# Patient Record
Sex: Female | Born: 1950 | Race: White | Hispanic: No | Smoking: Former smoker
Health system: Southern US, Community
[De-identification: ages and names within clinical notes are randomized; demographics above are authoritative.]

## PROBLEM LIST (undated history)

## (undated) DIAGNOSIS — E119 Type 2 diabetes mellitus without complications: Secondary | ICD-10-CM

## (undated) DIAGNOSIS — T7840XA Allergy, unspecified, initial encounter: Secondary | ICD-10-CM

## (undated) DIAGNOSIS — M797 Fibromyalgia: Secondary | ICD-10-CM

## (undated) DIAGNOSIS — C50919 Malignant neoplasm of unspecified site of unspecified female breast: Secondary | ICD-10-CM

## (undated) DIAGNOSIS — F419 Anxiety disorder, unspecified: Secondary | ICD-10-CM

## (undated) DIAGNOSIS — K7581 Nonalcoholic steatohepatitis (NASH): Secondary | ICD-10-CM

## (undated) DIAGNOSIS — I10 Essential (primary) hypertension: Secondary | ICD-10-CM

## (undated) DIAGNOSIS — Z8619 Personal history of other infectious and parasitic diseases: Secondary | ICD-10-CM

## (undated) DIAGNOSIS — E785 Hyperlipidemia, unspecified: Secondary | ICD-10-CM

## (undated) DIAGNOSIS — M1990 Unspecified osteoarthritis, unspecified site: Secondary | ICD-10-CM

## (undated) DIAGNOSIS — K219 Gastro-esophageal reflux disease without esophagitis: Secondary | ICD-10-CM

## (undated) HISTORY — PX: ESOPHAGOGASTRODUODENOSCOPY: SHX1529

## (undated) HISTORY — DX: Hyperlipidemia, unspecified: E78.5

## (undated) HISTORY — DX: Allergy, unspecified, initial encounter: T78.40XA

## (undated) HISTORY — DX: Anxiety disorder, unspecified: F41.9

## (undated) HISTORY — DX: Gastro-esophageal reflux disease without esophagitis: K21.9

## (undated) HISTORY — DX: Essential (primary) hypertension: I10

## (undated) HISTORY — PX: TONSILLECTOMY: SUR1361

## (undated) HISTORY — DX: Type 2 diabetes mellitus without complications: E11.9

## (undated) HISTORY — PX: CATARACT EXTRACTION, BILATERAL: SHX1313

## (undated) HISTORY — PX: COLONOSCOPY: SHX174

---

## 1971-05-26 HISTORY — PX: SPLENECTOMY: SUR1306

## 2006-05-25 DIAGNOSIS — Z923 Personal history of irradiation: Secondary | ICD-10-CM

## 2006-05-25 DIAGNOSIS — C50912 Malignant neoplasm of unspecified site of left female breast: Secondary | ICD-10-CM

## 2006-05-25 HISTORY — PX: BREAST LUMPECTOMY: SHX2

## 2006-05-25 HISTORY — DX: Personal history of irradiation: Z92.3

## 2006-05-25 HISTORY — DX: Malignant neoplasm of unspecified site of left female breast: C50.912

## 2007-05-26 HISTORY — PX: ANKLE FUSION: SHX881

## 2007-05-26 HISTORY — PX: APPENDECTOMY: SHX54

## 2009-05-14 ENCOUNTER — Emergency Department: Payer: Self-pay | Admitting: Emergency Medicine

## 2009-05-25 HISTORY — PX: ROTATOR CUFF REPAIR: SHX139

## 2012-05-25 HISTORY — PX: REDUCTION MAMMAPLASTY: SUR839

## 2012-07-24 DIAGNOSIS — S86899A Other injury of other muscle(s) and tendon(s) at lower leg level, unspecified leg, initial encounter: Secondary | ICD-10-CM | POA: Insufficient documentation

## 2013-01-03 DIAGNOSIS — M810 Age-related osteoporosis without current pathological fracture: Secondary | ICD-10-CM | POA: Insufficient documentation

## 2017-05-25 HISTORY — PX: TOTAL SHOULDER REPLACEMENT: SUR1217

## 2019-06-06 DIAGNOSIS — F419 Anxiety disorder, unspecified: Secondary | ICD-10-CM | POA: Insufficient documentation

## 2019-06-06 DIAGNOSIS — F5101 Primary insomnia: Secondary | ICD-10-CM | POA: Insufficient documentation

## 2019-06-06 DIAGNOSIS — G4709 Other insomnia: Secondary | ICD-10-CM | POA: Insufficient documentation

## 2019-06-06 DIAGNOSIS — Z9081 Acquired absence of spleen: Secondary | ICD-10-CM | POA: Insufficient documentation

## 2019-06-06 DIAGNOSIS — E119 Type 2 diabetes mellitus without complications: Secondary | ICD-10-CM | POA: Insufficient documentation

## 2019-06-06 DIAGNOSIS — R768 Other specified abnormal immunological findings in serum: Secondary | ICD-10-CM | POA: Insufficient documentation

## 2019-06-06 DIAGNOSIS — I10 Essential (primary) hypertension: Secondary | ICD-10-CM | POA: Insufficient documentation

## 2019-06-06 DIAGNOSIS — Z86000 Personal history of in-situ neoplasm of breast: Secondary | ICD-10-CM | POA: Insufficient documentation

## 2019-07-11 DIAGNOSIS — M797 Fibromyalgia: Secondary | ICD-10-CM | POA: Insufficient documentation

## 2019-07-11 DIAGNOSIS — Z8619 Personal history of other infectious and parasitic diseases: Secondary | ICD-10-CM | POA: Insufficient documentation

## 2019-07-24 DIAGNOSIS — H2511 Age-related nuclear cataract, right eye: Secondary | ICD-10-CM | POA: Insufficient documentation

## 2019-08-02 DIAGNOSIS — N941 Unspecified dyspareunia: Secondary | ICD-10-CM | POA: Insufficient documentation

## 2019-10-02 DIAGNOSIS — Z9289 Personal history of other medical treatment: Secondary | ICD-10-CM | POA: Insufficient documentation

## 2019-10-02 DIAGNOSIS — I517 Cardiomegaly: Secondary | ICD-10-CM | POA: Insufficient documentation

## 2019-10-02 DIAGNOSIS — I5189 Other ill-defined heart diseases: Secondary | ICD-10-CM | POA: Insufficient documentation

## 2019-12-06 DIAGNOSIS — E663 Overweight: Secondary | ICD-10-CM | POA: Insufficient documentation

## 2020-03-22 DIAGNOSIS — J301 Allergic rhinitis due to pollen: Secondary | ICD-10-CM | POA: Insufficient documentation

## 2020-03-22 DIAGNOSIS — J342 Deviated nasal septum: Secondary | ICD-10-CM | POA: Insufficient documentation

## 2020-07-03 DIAGNOSIS — J479 Bronchiectasis, uncomplicated: Secondary | ICD-10-CM | POA: Insufficient documentation

## 2020-07-25 DIAGNOSIS — K7460 Unspecified cirrhosis of liver: Secondary | ICD-10-CM | POA: Insufficient documentation

## 2020-12-27 LAB — HM COLONOSCOPY

## 2021-03-10 DIAGNOSIS — M24672 Ankylosis, left ankle: Secondary | ICD-10-CM | POA: Insufficient documentation

## 2021-03-10 DIAGNOSIS — M65331 Trigger finger, right middle finger: Secondary | ICD-10-CM | POA: Insufficient documentation

## 2021-03-21 DIAGNOSIS — Z789 Other specified health status: Secondary | ICD-10-CM | POA: Insufficient documentation

## 2021-03-25 DIAGNOSIS — M12579 Traumatic arthropathy, unspecified ankle and foot: Secondary | ICD-10-CM | POA: Insufficient documentation

## 2021-06-04 DIAGNOSIS — M542 Cervicalgia: Secondary | ICD-10-CM | POA: Diagnosis not present

## 2021-06-04 DIAGNOSIS — M9902 Segmental and somatic dysfunction of thoracic region: Secondary | ICD-10-CM | POA: Diagnosis not present

## 2021-06-04 DIAGNOSIS — M9901 Segmental and somatic dysfunction of cervical region: Secondary | ICD-10-CM | POA: Diagnosis not present

## 2021-06-04 DIAGNOSIS — R519 Headache, unspecified: Secondary | ICD-10-CM | POA: Diagnosis not present

## 2021-07-04 ENCOUNTER — Telehealth: Payer: Self-pay

## 2021-07-04 NOTE — Telephone Encounter (Signed)
Copied from [place] [phone]. Topic: General - Other >> Jul 04, 2021  1:50 PM Pawlus, Brayton Layman A wrote: Reason for CRM: Pt called in to make sure her records from [facility] have been connected to her [place] MyChart account, please advise if pt would still need to get her medical records sent over from [facility] as well.

## 2021-07-04 NOTE — Telephone Encounter (Signed)
Pt was called to let them know that we can see her MR from [facility]

## 2021-07-21 ENCOUNTER — Ambulatory Visit: Payer: Self-pay | Admitting: Internal Medicine

## 2021-07-24 DIAGNOSIS — Z7984 Long term (current) use of oral hypoglycemic drugs: Secondary | ICD-10-CM | POA: Diagnosis not present

## 2021-07-24 DIAGNOSIS — Z961 Presence of intraocular lens: Secondary | ICD-10-CM | POA: Diagnosis not present

## 2021-07-24 DIAGNOSIS — H04123 Dry eye syndrome of bilateral lacrimal glands: Secondary | ICD-10-CM | POA: Diagnosis not present

## 2021-07-24 DIAGNOSIS — H52221 Regular astigmatism, right eye: Secondary | ICD-10-CM | POA: Diagnosis not present

## 2021-07-24 DIAGNOSIS — H01119 Allergic dermatitis of unspecified eye, unspecified eyelid: Secondary | ICD-10-CM | POA: Diagnosis not present

## 2021-07-24 DIAGNOSIS — H5201 Hypermetropia, right eye: Secondary | ICD-10-CM | POA: Diagnosis not present

## 2021-07-24 DIAGNOSIS — E119 Type 2 diabetes mellitus without complications: Secondary | ICD-10-CM | POA: Diagnosis not present

## 2021-07-24 DIAGNOSIS — H524 Presbyopia: Secondary | ICD-10-CM | POA: Diagnosis not present

## 2021-07-24 DIAGNOSIS — H5212 Myopia, left eye: Secondary | ICD-10-CM | POA: Diagnosis not present

## 2021-07-24 LAB — HM DIABETES EYE EXAM

## 2021-07-29 ENCOUNTER — Ambulatory Visit
Admission: RE | Admit: 2021-07-29 | Discharge: 2021-07-29 | Disposition: A | Discharge status: Home / Self Care | Payer: Medicare HMO | Attending: Family Medicine | Admitting: Family Medicine

## 2021-07-29 ENCOUNTER — Ambulatory Visit
Admission: RE | Admit: 2021-07-29 | Discharge: 2021-07-29 | Disposition: A | Discharge status: Home / Self Care | Payer: Medicare HMO | Source: Ambulatory Visit | Attending: Family Medicine | Admitting: Family Medicine

## 2021-07-29 ENCOUNTER — Encounter: Payer: Self-pay | Admitting: Family Medicine

## 2021-07-29 ENCOUNTER — Other Ambulatory Visit: Payer: Self-pay

## 2021-07-29 ENCOUNTER — Ambulatory Visit (INDEPENDENT_AMBULATORY_CARE_PROVIDER_SITE_OTHER): Payer: Medicare HMO | Admitting: Family Medicine

## 2021-07-29 VITALS — BP 124/82 | HR 74 | Ht 63.5 in | Wt 172.0 lb

## 2021-07-29 DIAGNOSIS — M25561 Pain in right knee: Secondary | ICD-10-CM

## 2021-07-29 DIAGNOSIS — M25572 Pain in left ankle and joints of left foot: Secondary | ICD-10-CM | POA: Diagnosis not present

## 2021-07-29 DIAGNOSIS — M1990 Unspecified osteoarthritis, unspecified site: Secondary | ICD-10-CM | POA: Insufficient documentation

## 2021-07-29 DIAGNOSIS — M722 Plantar fascial fibromatosis: Secondary | ICD-10-CM

## 2021-07-29 DIAGNOSIS — M25512 Pain in left shoulder: Secondary | ICD-10-CM

## 2021-07-29 DIAGNOSIS — M79671 Pain in right foot: Secondary | ICD-10-CM | POA: Diagnosis not present

## 2021-07-29 DIAGNOSIS — G8929 Other chronic pain: Secondary | ICD-10-CM | POA: Diagnosis not present

## 2021-07-29 DIAGNOSIS — M1711 Unilateral primary osteoarthritis, right knee: Secondary | ICD-10-CM | POA: Diagnosis not present

## 2021-07-29 DIAGNOSIS — Z853 Personal history of malignant neoplasm of breast: Secondary | ICD-10-CM | POA: Diagnosis not present

## 2021-07-29 DIAGNOSIS — M19012 Primary osteoarthritis, left shoulder: Secondary | ICD-10-CM | POA: Diagnosis not present

## 2021-07-29 DIAGNOSIS — Z981 Arthrodesis status: Secondary | ICD-10-CM | POA: Diagnosis not present

## 2021-07-29 DIAGNOSIS — M2011 Hallux valgus (acquired), right foot: Secondary | ICD-10-CM | POA: Diagnosis not present

## 2021-07-29 DIAGNOSIS — M958 Other specified acquired deformities of musculoskeletal system: Secondary | ICD-10-CM | POA: Diagnosis not present

## 2021-07-29 MED ORDER — DICLOFENAC SODIUM 75 MG PO TBEC: 75.0000 mg | DELAYED_RELEASE_TABLET | Freq: Two times a day (BID) | ORAL | 0 refills | Status: DC

## 2021-07-29 NOTE — Patient Instructions (Signed)
-   Stop meloxicam ?- Start diclofenac 75 mg, dose a.m. and p.m. scheduled (take with food) ?- Use silicone heel cup inserts until return ?- Obtain x-rays ?- Return for follow-up next week, contact us for questions between now and then ?

## 2021-07-29 NOTE — Progress Notes (Signed)
?  ? ?  Primary Care / Sports Medicine Office Visit ? ?Patient Information:  ?Patient ID: Sharon Kaufman, female DOB: 04/07/1951 Age: 71 y.o. MRN: [id number]  ? ?Sharon Kaufman is a pleasant 71 y.o. female presenting with the following: ? ?Chief Complaint  ?Patient presents with  ? New Patient (Initial Visit)  ? Knee Pain  ?  Right, chronic; x3 years; 6/10 pain  ? Foot Pain  ?  Right; x10 months; injection at [facility] at the onset, but pain has returned; 6/10 pain  ? ? ?Vitals:  ? 07/29/21 1445  ?BP: 124/82  ?Pulse: 74  ?SpO2: 97%  ? ?Vitals:  ? 07/29/21 1445  ?Weight: 172 lb (78 kg)  ?Height: 5' 3.5" (1.613 m)  ? ?Body mass index is 29.99 kg/m?. ? ?No results found.  ? ?Independent interpretation of notes and tests performed by another provider:  ? ?None ? ?Procedures performed:  ? ?None ? ?Pertinent History, Exam, Impression, and Recommendations:  ? ?Chronic pain of right knee ?Chronic condition, ongoing for years, has noted recent flare over the past several months, atraumatic onset.  Pain localized to the anterior knee.  Examination findings do reveal tenderness about the patella facets, medial greater than lateral joint line, provocative testing otherwise benign.  Findings most consistent with tricompartmental osteoarthritis related arthralgia.  Plan for dedicated x-rays of the right knee, transition of meloxicam to diclofenac given her symptomatology on the meloxicam.  Low threshold for advanced a cortisone pending symptoms at her return. ? ?Chronic condition, symptomatic, Rx management ? ?Plantar fasciitis, right ?This is a chronic condition, has noted recurrence over the past few months as any change in activity preceding recurrence.  Treated in the distant past with cortisone injection, has been dosing meloxicam currently with limited benefit. ? ?Examination findings shows focal tenderness at the and the area calcaneus medially, tightness throughout the Achilles.  Plan for silicone heel cup inserts,  transition to diclofenac, close follow-up after x-rays.  Low threshold for injection given duration of symptoms and treatments to date. ? ?Chronic condition, symptomatic, Rx management  ? ?Orders & Medications ?Meds ordered this encounter  ?Medications  ? diclofenac (VOLTAREN) 75 MG EC tablet  ?  Sig: Take 1 tablet (75 mg total) by mouth 2 (two) times daily.  ?  Dispense:  60 tablet  ?  Refill:  0  ? ?Orders Placed This Encounter  ?Procedures  ? DG Knee Complete 4 Views Right  ? DG Foot Complete Right  ? DG Shoulder Left  ? DG Ankle Complete Left  ?  ? ?Return in about 1 week (around 08/05/2021).  ?  ? ?Montel Culver, MD ? ? Primary Care Sports Medicine ?[facility] ?[place]  ? ?

## 2021-07-29 NOTE — Assessment & Plan Note (Deleted)
Chronic condition, ongoing for years, has noted recent flare over the past several months, atraumatic onset.  Pain localized to the anterior knee.  Examination findings do reveal tenderness about the patella facets, medial greater than lateral joint line, provocative testing otherwise benign.  Findings most consistent with tricompartmental osteoarthritis related arthralgia.  Plan for dedicated x-rays of the right knee, transition of meloxicam to diclofenac given her symptomatology on the meloxicam.  Low threshold for advanced a cortisone pending symptoms at her return. ? ?Chronic condition, symptomatic, Rx management ?

## 2021-07-29 NOTE — Assessment & Plan Note (Signed)
This is a chronic condition, has noted recurrence over the past few months as any change in activity preceding recurrence.  Treated in the distant past with cortisone injection, has been dosing meloxicam currently with limited benefit. ? ?Examination findings shows focal tenderness at the and the area calcaneus medially, tightness throughout the Achilles.  Plan for silicone heel cup inserts, transition to diclofenac, close follow-up after x-rays.  Low threshold for injection given duration of symptoms and treatments to date. ? ?Chronic condition, symptomatic, Rx management ?

## 2021-07-29 NOTE — Assessment & Plan Note (Signed)
Chronic condition, ongoing for years, has noted recent flare over the past several months, atraumatic onset.  Pain localized to the anterior knee.  Examination findings do reveal tenderness about the patella facets, medial greater than lateral joint line, provocative testing otherwise benign.  Findings most consistent with tricompartmental osteoarthritis related arthralgia.  Plan for dedicated x-rays of the right knee, transition of meloxicam to diclofenac given her symptomatology on the meloxicam.  Low threshold for advanced a cortisone pending symptoms at her return. ? ?Chronic condition, symptomatic, Rx management ?

## 2021-07-30 ENCOUNTER — Telehealth: Payer: Self-pay

## 2021-07-30 NOTE — Telephone Encounter (Signed)
-----   Message from Montel Culver, MD sent at 07/29/2021  5:50 PM EST ----- ?Regarding: HA authorization ?Please reach out for auth for right knee HA injections (gel injections). Thanks ? ?

## 2021-07-30 NOTE — Telephone Encounter (Signed)
New case for viscosupplementation for right knee submitted through the [employer] portal.  Pending response.  For your information.   ?

## 2021-08-01 NOTE — Telephone Encounter (Signed)
Patient covered at 100% for Monovisc through both medical and pharmacy benefit once OOP has been met. OOP is $3,400.00 and $20.00 has been met, so patient will have to pay the difference ($3,380.00).  There is also a $20 procedure copay charged at the time of visit. PA is not required.  For your information.  ?

## 2021-08-05 ENCOUNTER — Other Ambulatory Visit: Payer: Self-pay

## 2021-08-05 ENCOUNTER — Encounter: Payer: Self-pay | Admitting: Family Medicine

## 2021-08-05 ENCOUNTER — Ambulatory Visit (INDEPENDENT_AMBULATORY_CARE_PROVIDER_SITE_OTHER): Payer: Medicare HMO | Admitting: Family Medicine

## 2021-08-05 VITALS — BP 136/82 | HR 73 | Ht 63.5 in | Wt 173.0 lb

## 2021-08-05 DIAGNOSIS — M7918 Myalgia, other site: Secondary | ICD-10-CM

## 2021-08-05 DIAGNOSIS — G8929 Other chronic pain: Secondary | ICD-10-CM | POA: Diagnosis not present

## 2021-08-05 DIAGNOSIS — M1711 Unilateral primary osteoarthritis, right knee: Secondary | ICD-10-CM | POA: Diagnosis not present

## 2021-08-05 DIAGNOSIS — M722 Plantar fascial fibromatosis: Secondary | ICD-10-CM

## 2021-08-05 MED ORDER — DULOXETINE HCL 30 MG PO CPEP: 30.0000 mg | ORAL_CAPSULE | Freq: Every day | ORAL | 0 refills | Status: DC

## 2021-08-05 NOTE — Assessment & Plan Note (Signed)
Patient presents for follow-up to right chronic heel pain, at last visit on 07/29/2021 her findings are most consistent with plantar fasciitis.  She was advised to transition from meloxicam to diclofenac, initiation of silicone heel cup inserts, x-rays, and close follow-up. ? ?Unfortunate, patient continues to be symptomatic despite these interventions, her x-rays do reveal changes consistent with plantar fasciitis as well as Achilles tendinopathy.  I reviewed the nature of the posterior chain in relation to recurrent/symptomatic plantar fasciitis. ? ?At this stage have advised close follow-up, initiation of duloxetine, and low threshold to proceed with ultrasound-guided corticosteroid injection if symptomatic at return. ? ?Chronic condition, symptomatic, Rx management, independent interpretation x-rays ?

## 2021-08-05 NOTE — Patient Instructions (Addendum)
-   Continue diclofenac, can transition to an as needed up to twice a day dosing (take with food) ?- For additional pain control, can use Tylenol (acetaminophen) on an as-needed basis ?- Once available, start duloxetine (Cymbalta) 30 mg nightly ?- After 2 weeks, increase duloxetine to 60 mg nightly (2 capsules) ?- Return once scheduled for next available visit to follow-up on right knee and right foot ?- Obtain labs with orders placed today ?- Our office will contact you with results and next steps next-contact us for any questions between now and then ?

## 2021-08-05 NOTE — Progress Notes (Signed)
?  ? ?Primary Care / Sports Medicine Office Visit ? ?Patient Information:  ?Patient ID: Sharon Kaufman, female DOB: 1950-10-25 Age: 71 y.o. MRN: [id number]  ? ?Sharon Kaufman is a pleasant 71 y.o. female presenting with the following: ? ?Chief Complaint  ?Patient presents with  ? Knee Pain  ?  Right knee  ? Right heel   ? Shoulder Pain  ?  right  ? ? ?Vitals:  ? 08/05/21 1120  ?BP: 136/82  ?Pulse: 73  ?SpO2: 97%  ? ?Vitals:  ? 08/05/21 1120  ?Weight: 173 lb (78.5 kg)  ?Height: 5' 3.5" (1.613 m)  ? ?Body mass index is 30.16 kg/m?. ? ?  ? ?Independent interpretation of notes and tests performed by another provider:  ? 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 ?Procedures performed:  ? ?None ? ?Pertinent History, Exam, Impression, and Recommendations:  ? ?Osteoarthritis of right knee ?Patient presents for right knee pain, ongoing for years.  At her last visit on 07/29/2021, she was advised dedicated right knee x-rays, transition from meloxicam to diclofenac and close follow-up. ? ?Unfortunately, despite medication changes, she has noted no change in her symptomatology.  X-rays do reveal degenerative changes primarily localized about the patellofemoral and medial tibiofemoral articulations which are consistent with her examination findings.   That being said, given the persistent symptomatology, multiple sites of pain, stated history of arthralgia, a component thereof may be at play. ? ?Plan for transition from scheduled to as needed diclofenac, initiation of duloxetine at 30 mg with titration to 60 mg after 2 weeks.  Close follow-up for reevaluation and low threshold to proceed with intra-articular corticosteroid injection under ultrasound guidance if indicated. ? ?Chronic condition, symptomatic, independent interpretation x-ray, Rx management ? ?Chronic musculoskeletal pain ?Chronic condition affecting multiple sites that patient has endorsed inclusive of left shoulder, bilateral wrist/thumbs, right knee, right foot, left ankle.  She gives a stated history of both fibromyalgia as well as rheumatic concern unspecified.  Her records are from outside groups.  She has been advised to attend a rheumatologist in the past and has never seen one. ? ?Given her broad array of stated symptomatology plan for rheumatologic serum panel, referral to rheumatologist pending panel results, and initiation of duloxetine at 30 mg with plan for further titration to 60 mg after 2 weeks. ? ?Chronic condition, symptomatic, Rx management ? ?Plantar fasciitis, right ?Patient presents for follow-up to right chronic heel pain, at last visit on 07/29/2021 her findings are most consistent with plantar fasciitis.  She was advised to transition from meloxicam to diclofenac, initiation of silicone heel cup inserts, x-rays, and close follow-up. ? ?Unfortunate, patient continues to be symptomatic despite these interventions, her x-rays do reveal changes consistent with plantar fasciitis as well as Achilles tendinopathy.  I reviewed the nature of the posterior chain in relation to recurrent/symptomatic plantar fasciitis. ? ?At this stage have advised close follow-up, initiation of duloxetine, and low threshold to proceed with ultrasound-guided corticosteroid injection if symptomatic at  return. ? ?  Chronic condition, symptomatic, Rx management, independent interpretation x-rays  ? ?Orders & Medications ?Meds ordered this encounter  ?Medications  ? DULoxetine (CYMBALTA) 30 MG capsule  ?  Sig: Take 1 capsule (30 mg total) by mouth daily.  ?  Dispense:  90 capsule  ?  Refill:  0  ? ?Orders Placed This Encounter  ?Procedures  ? ANA 12 Plus Profile (RDL)  ?  ? ?Return for follow-up right knee and right foot/heel.  ?  ? ?Montel Culver, MD ? ? Primary Care Sports Medicine ?[facility] ?[place]  ? ?

## 2021-08-05 NOTE — Assessment & Plan Note (Signed)
Patient presents for right knee pain, ongoing for years.  At her last visit on 07/29/2021, she was advised dedicated right knee x-rays, transition from meloxicam to diclofenac and close follow-up. ? ?Unfortunately, despite medication changes, she has noted no change in her symptomatology.  X-rays do reveal degenerative changes primarily localized about the patellofemoral and medial tibiofemoral articulations which are consistent with her examination findings.  That being said, given the persistent symptomatology, multiple sites of pain, stated history of arthralgia, a component thereof may be at play. ? ?Plan for transition from scheduled to as needed diclofenac, initiation of duloxetine at 30 mg with titration to 60 mg after 2 weeks.  Close follow-up for reevaluation and low threshold to proceed with intra-articular corticosteroid injection under ultrasound guidance if indicated. ? ?Chronic condition, symptomatic, independent interpretation x-ray, Rx management ?

## 2021-08-05 NOTE — Assessment & Plan Note (Signed)
Chronic condition affecting multiple sites that patient has endorsed inclusive of left shoulder, bilateral wrist/thumbs, right knee, right foot, left ankle.  She gives a stated history of both fibromyalgia as well as rheumatic concern unspecified.  Her records are from outside groups.  She has been advised to attend a rheumatologist in the past and has never seen one. ? ?Given her broad array of stated symptomatology plan for rheumatologic serum panel, referral to rheumatologist pending panel results, and initiation of duloxetine at 30 mg with plan for further titration to 60 mg after 2 weeks. ? ?Chronic condition, symptomatic, Rx management ?

## 2021-08-12 ENCOUNTER — Inpatient Hospital Stay (INDEPENDENT_AMBULATORY_CARE_PROVIDER_SITE_OTHER): Payer: Medicare HMO | Admitting: Radiology

## 2021-08-12 ENCOUNTER — Encounter: Payer: Self-pay | Admitting: Family Medicine

## 2021-08-12 ENCOUNTER — Other Ambulatory Visit: Payer: Self-pay

## 2021-08-12 ENCOUNTER — Ambulatory Visit (INDEPENDENT_AMBULATORY_CARE_PROVIDER_SITE_OTHER): Payer: Medicare HMO | Admitting: Family Medicine

## 2021-08-12 ENCOUNTER — Telehealth: Payer: Self-pay

## 2021-08-12 VITALS — BP 124/82 | HR 84 | Ht 63.5 in | Wt 173.6 lb

## 2021-08-12 DIAGNOSIS — M722 Plantar fascial fibromatosis: Secondary | ICD-10-CM | POA: Diagnosis not present

## 2021-08-12 DIAGNOSIS — M1711 Unilateral primary osteoarthritis, right knee: Secondary | ICD-10-CM

## 2021-08-12 MED ORDER — TRIAMCINOLONE ACETONIDE 40 MG/ML IJ SUSP
80.0000 mg | Freq: Once | INTRAMUSCULAR | Status: AC
  Administered 2021-08-12: 80 mg via INTRAMUSCULAR

## 2021-08-12 NOTE — Progress Notes (Signed)
?  ? ?Primary Care / Sports Medicine Office Visit ? ?Patient Information:  ?Patient ID: Sharon Kaufman, female DOB: [birth year] Age: 71 y.o. MRN: [id number]  ? ?Sharon Kaufman is a pleasant 72 y.o. female presenting with the following: ? ?Chief Complaint  ?Patient presents with  ? Knee Pain  ?  Right, still same. Wants injection  ? Plantar Fasciitis  ?  Right still same, wants injection  ? ? ?Vitals:  ? 08/12/21 1127  ?BP: 124/82  ?Pulse: 84  ?SpO2: 98%  ? ?Vitals:  ? 08/12/21 1127  ?Weight: 173 lb 9.6 oz (78.7 kg)  ?Height: 5' 3.5" (1.613 m)  ? ?Body mass index is 30.27 kg/m?. ? ?  ? ?Independent interpretation of notes and tests performed by another provider:  ? ?None ? ?Procedures performed:  ? ?Procedure:  Injection of right plantar fascia at the calcaneus under ultrasound guidance. ?Ultrasound guidance utilized for in-plane approach from the medial hindfoot to the plantar fascia at the calcaneus, peri-fascial subtle effusion noted ?Samsung HS60 device utilized with permanent recording / reporting. ?Verbal informed consent obtained and verified. ?Skin prepped in a sterile fashion. ?Ethyl chloride for topical local analgesia.  ?Completed without difficulty and tolerated well. ?Medication: triamcinolone acetonide 40 mg/mL suspension for injection 1 mL total and 1 mL lidocaine 1% without epinephrine utilized for needle placement anesthetic ?Advised to contact for fevers/chills, erythema, induration, drainage, or persistent bleeding. ? ?Procedure:  Injection of right knee under ultrasound guidance. ?Ultrasound guidance utilized for anteromedial out of plane approach to the right knee joint, no effusion noted ?Samsung HS60 device utilized with permanent recording / reporting. ?Verbal informed consent obtained and verified. ?Skin prepped in a sterile fashion. ?Ethyl chloride for topical local analgesia.  ?Completed without difficulty and tolerated well. ?Medication: triamcinolone acetonide 40 mg/mL suspension for  injection 1 mL total and 2 mL lidocaine 1% without epinephrine utilized for needle placement anesthetic ?Advised to contact for fevers/chills, erythema, induration, drainage, or persistent bleeding. ? ? ?Pertinent History, Exam, Impression, and Recommendations:  ? ?Osteoarthritis of right knee ?Patient presents for follow-up to chronic right knee pain with ongoing exacerbation.  At last visit on 08/05/2021 she was transition to as needed diclofenac, started on duloxetine 30 mg. ? ?Unfortunate, patient continues to endorse significant pain at the right knee, as such she did elect to proceed with ultrasound guided cortisone injection of the right knee.  She tolerated this well post care reviewed.  From a medication management standpoint I discussed titration of duloxetine to 60 mg daily as she is tolerating 30 mg dose without issue.  Plan for reevaluation in 6 weeks to assess residual symptoms. ? ?Chronic condition, symptomatic, Rx management ? ?Plantar fasciitis, right ?Patient returns with chronic right heel pain in the setting of Planter fasciitis with ongoing exacerbation.  At her last visit on 08/05/2021 she was advised transition to diclofenac, initiation of duloxetine, and close follow-up. ? ?Patient has persistent symptomatology without significant interval improvement despite interventions, as such she did elect to proceed with ultrasound-guided right plantar fascia injection of cortisone at the calcaneus.  Post care reviewed.  Additionally, I discussed further titration of duloxetine to 60 mg as she is tolerating the 30 mg dose well.  Plan return in 6 weeks to assess residual symptoms. ? ?Chronic condition, symptomatic, Rx management ?  ? ?Orders & Medications ?Meds ordered this encounter  ?Medications  ? triamcinolone acetonide (KENALOG-40) injection 80 mg  ? ?Orders Placed This Encounter  ?Procedures  ? [place] LIMITED  JOINT SPACE STRUCTURES LOW RIGHT  ?  ? ?Return in about 6 weeks (around 09/23/2021).  ?  ? ?Montel Culver, MD ? ? Primary Care Sports Medicine ?[facility] ?[place]  ? ?

## 2021-08-12 NOTE — Telephone Encounter (Signed)
Copied from [place] [id number]. Topic: General - Inquiry ?>> Aug 12, 2021  2:06 PM Greggory Keen D wrote: ?Reason for CRM: Pt had a lab test done last week and has not gotten the results back yet. ? ?CB#  [phone] ?

## 2021-08-12 NOTE — Patient Instructions (Addendum)
You have just been given a cortisone injection to reduce pain and inflammation. After the injection you may notice immediate relief of pain as a result of the Lidocaine. It is important to rest the area of the injection for 24 to 48 hours after the injection. There is a possibility of some temporary increased discomfort and swelling for up to 72 hours until the cortisone begins to work. If you do have pain, simply rest the joint and use ice. If you can tolerate over the counter medications, you can try Tylenol, Aleve, or Advil for added relief per package instructions. ?- Transition to as needed dosing of previously prescribed anti-inflammatory diclofenac ?- Start Cymbalta 30 mg today, continue x2 weeks ?- After 2 weeks increase to Cymbalta 60 mg (2 capsules) daily until follow-up ?- Return for follow-up in 6 weeks ?

## 2021-08-12 NOTE — Assessment & Plan Note (Signed)
Patient presents for follow-up to chronic right knee pain with ongoing exacerbation.  At last visit on 08/05/2021 she was transition to as needed diclofenac, started on duloxetine 30 mg. ? ?Unfortunate, patient continues to endorse significant pain at the right knee, as such she did elect to proceed with ultrasound guided cortisone injection of the right knee.  She tolerated this well post care reviewed.  From a medication management standpoint I discussed titration of duloxetine to 60 mg daily as she is tolerating 30 mg dose without issue.  Plan for reevaluation in 6 weeks to assess residual symptoms. ? ?Chronic condition, symptomatic, Rx management ?

## 2021-08-12 NOTE — Assessment & Plan Note (Signed)
Patient returns with chronic right heel pain in the setting of Planter fasciitis with ongoing exacerbation.  At her last visit on 08/05/2021 she was advised transition to diclofenac, initiation of duloxetine, and close follow-up. ? ?Patient has persistent symptomatology without significant interval improvement despite interventions, as such she did elect to proceed with ultrasound-guided right plantar fascia injection of cortisone at the calcaneus.  Post care reviewed.  Additionally, I discussed further titration of duloxetine to 60 mg as she is tolerating the 30 mg dose well.  Plan return in 6 weeks to assess residual symptoms. ? ?Chronic condition, symptomatic, Rx management ? ?

## 2021-08-18 LAB — ANA 12 PLUS PROFILE, POSITIVE
Anti-CCP Ab, IgG & IgA (RDL): <20 Units (ref <20)
Anti-Cardiolipin Ab, IgA (RDL): <12 APL U/mL (ref <12)
Anti-Cardiolipin Ab, IgG (RDL): <15 GPL U/mL (ref <15)
Anti-Cardiolipin Ab, IgM (RDL): <13 MPL U/mL (ref <13)
Anti-Centromere Ab (RDL): <1:40 {titer}
Anti-Chromatin Ab, IgG (RDL): <20 Units (ref <20)
Anti-La (SS-B) Ab (RDL): <20 Units (ref <20)
Anti-Ro (SS-A) Ab (RDL): <20 Units (ref <20)
Anti-Scl-70 Ab (RDL): <20 Units (ref <20)
Anti-Sm Ab (RDL): <20 Units (ref <20)
Anti-TPO Ab (RDL): <9 IU/mL (ref <9.0)
Anti-U1 RNP Ab (RDL): <20 Units (ref <20)
Anti-dsDNA Ab by Farr(RDL): <8 IU/mL (ref <8.0)
C3 Complement (RDL): 166 mg/dL (ref 82–167)
C4 Complement (RDL): 37 mg/dL (ref 14–44)
Homogeneous Pattern: 1:160 {titer} — ABNORMAL HIGH
Rheumatoid Factor by Turb RDL: 17 IU/mL — ABNORMAL HIGH (ref <14)

## 2021-08-18 LAB — ANA 12 PLUS PROFILE (RDL): Anti-Nuclear Ab by IFA (RDL): POSITIVE — AB

## 2021-08-19 ENCOUNTER — Other Ambulatory Visit: Payer: Self-pay

## 2021-08-19 DIAGNOSIS — H01119 Allergic dermatitis of unspecified eye, unspecified eyelid: Secondary | ICD-10-CM | POA: Diagnosis not present

## 2021-08-19 DIAGNOSIS — M0809 Unspecified juvenile rheumatoid arthritis, multiple sites: Secondary | ICD-10-CM

## 2021-08-19 DIAGNOSIS — H04123 Dry eye syndrome of bilateral lacrimal glands: Secondary | ICD-10-CM | POA: Diagnosis not present

## 2021-08-19 DIAGNOSIS — R768 Other specified abnormal immunological findings in serum: Secondary | ICD-10-CM

## 2021-08-19 NOTE — Progress Notes (Signed)
Referral placed.  KP 

## 2021-08-19 NOTE — Progress Notes (Signed)
PC to pt, discussed results and referral, pt voiced understanding.  ?

## 2021-08-22 ENCOUNTER — Other Ambulatory Visit: Payer: Self-pay | Admitting: Family Medicine

## 2021-08-22 ENCOUNTER — Telehealth (INDEPENDENT_AMBULATORY_CARE_PROVIDER_SITE_OTHER): Payer: Medicare HMO | Admitting: Family Medicine

## 2021-08-22 ENCOUNTER — Encounter: Payer: Self-pay | Admitting: Family Medicine

## 2021-08-22 ENCOUNTER — Telehealth: Payer: Self-pay | Admitting: Family Medicine

## 2021-08-22 VITALS — Ht 63.5 in

## 2021-08-22 DIAGNOSIS — G8929 Other chronic pain: Secondary | ICD-10-CM | POA: Diagnosis not present

## 2021-08-22 DIAGNOSIS — M25561 Pain in right knee: Secondary | ICD-10-CM | POA: Diagnosis not present

## 2021-08-22 DIAGNOSIS — M722 Plantar fascial fibromatosis: Secondary | ICD-10-CM

## 2021-08-22 DIAGNOSIS — R768 Other specified abnormal immunological findings in serum: Secondary | ICD-10-CM | POA: Diagnosis not present

## 2021-08-22 DIAGNOSIS — M1711 Unilateral primary osteoarthritis, right knee: Secondary | ICD-10-CM

## 2021-08-22 MED ORDER — DICLOFENAC SODIUM 75 MG PO TBEC: 75.0000 mg | DELAYED_RELEASE_TABLET | Freq: Two times a day (BID) | ORAL | 0 refills | Status: DC

## 2021-08-22 MED ORDER — DICLOFENAC SODIUM 75 MG PO TBEC: 75.0000 mg | DELAYED_RELEASE_TABLET | Freq: Two times a day (BID) | ORAL | 0 refills | Status: DC | PRN

## 2021-08-22 NOTE — Telephone Encounter (Signed)
Duplicate request- refused by office today as too soon ?Rx 07/29/21 #60 ?Requested Prescriptions  ?Pending Prescriptions Disp Refills  ?? diclofenac (VOLTAREN) 75 MG EC tablet 60 tablet 0  ?  Sig: Take 1 tablet (75 mg total) by mouth 2 (two) times daily.  ?  ? Analgesics:  NSAIDS Failed - 08/22/2021  1:15 PM  ?  ?  Failed - Manual Review: Labs are only required if the patient has taken medication for more than 8 weeks.  ?  ?  Failed - Cr in normal range and within 360 days  ?  No results found for: CREATININE, LABCREAU, LABCREA, POCCRE   ?  ?  Failed - HGB in normal range and within 360 days  ?  No results found for: HGB, HGBKUC, HGBPOCKUC, HGBOTHER, TOTHGB, HGBPLASMA   ?  ?  Failed - PLT in normal range and within 360 days  ?  No results found for: PLT, PLTCOUNTKUC, LABPLAT, POCPLA   ?  ?  Failed - HCT in normal range and within 360 days  ?  No results found for: HCT, HCTKUC, SRHCT   ?  ?  Failed - eGFR is 30 or above and within 360 days  ?  No results found for: GFRAA, GFRNONAA, GFR, EGFR   ?  ?  Passed - Patient is not pregnant  ?  ?  Passed - Valid encounter within last 12 months  ?  Recent Outpatient Visits   ?      ? Today   ? [facility] Montel Culver, MD  ? 1 week ago Osteoarthritis of right knee, unspecified osteoarthritis type  ? [place], [place]  ? 2 weeks ago Plantar fasciitis, right  ? [facility] Montel Culver, MD  ? 3 weeks ago Chronic pain of right knee  ? [facility] Montel Culver, MD  ?  ?  ?Future Appointments   ?        ? In 1 month Juline Patch, MD [facility] [place]  ?  ? ?  ?  ?  ? ?

## 2021-08-22 NOTE — Telephone Encounter (Signed)
Appointment today 08/22/2021 video ?

## 2021-08-22 NOTE — Progress Notes (Signed)
?  ? ?Primary Care / Sports Medicine Virtual Visit ? ?Patient Information:  ?Patient ID: Sharon Kaufman, female DOB: 11-19-1950 Age: 71 y.o. MRN: [id number]  ? ?Sharon Kaufman is a pleasant 71 y.o. female presenting with the following: ? ?Chief Complaint  ?Patient presents with  ? Medication Problem  ? ? ?Review of Systems: No fevers, chills, night sweats, weight loss, chest pain, or shortness of breath.  ? ?Patient Active Problem List  ? Diagnosis Date Noted  ? Positive ANA (antinuclear antibody) 08/22/2021  ? Chronic musculoskeletal pain 08/05/2021  ? Osteoarthritis (arthritis due to wear and tear of joints) 07/29/2021  ? Osteoarthritis of right knee 07/29/2021  ? Plantar fasciitis, right 07/29/2021  ? 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 Statin intolerance 03/21/2021  ? Acquired trigger finger of right middle finger 03/10/2021  ? Ankylosis of ankle joint, left 03/10/2021  ? Cirrhosis ([place]) 07/25/2020  ? Bronchiectasis without complication ([place]) 17/49/4496  ? Deviated septum 03/22/2020  ? Allergic rhinitis due to pollen 03/22/2020  ? Overweight (BMI 25.0-29.9) 12/06/2019  ? Hypertrophy of inter-atrial septum 10/02/2019  ? History of cardiovascular stress test 10/02/2019  ? Diastolic dysfunction without heart failure 10/02/2019  ? Dyspareunia in female 08/02/2019  ? Cataract, nuclear sclerotic senile, right 07/24/2019  ? Fibromyalgia 07/11/2019  ? History of hepatitis C 07/11/2019  ? Rheumatoid factor positive 06/06/2019  ? Other insomnia 06/06/2019  ? 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  ? 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 Essential hypertension 06/06/2019  ? Diabetes type 2, controlled ([place]) 06/06/2019  ? Anxiety 06/06/2019  ? Age-related osteoporosis without current pathological fracture 01/03/2013  ? 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/2012  ? ?Past Medical History:  ?Diagnosis Date  ? Anxiety   ? Breast cancer, left ([place]) 2008  ? Diabetes mellitus without  complication ([place])   ? GERD (gastroesophageal reflux disease)   ? Hyperlipidemia   ? Hypertension   ? ?Outpatient Encounter Medications as of 08/22/2021  ?Medication Sig  ? Accu-Chek FastClix Lancets MISC 1 each by Other route daily.  ? ACCU-CHEK GUIDE test strip 1 each by Other route daily.  ? Ascorbic Acid (VITAMIN C) 1000 MG tablet Take 1,000 mg by mouth daily.  ? aspirin 81 MG EC tablet Take 81 mg by mouth daily.  ? CALCIUM-VITAMIN D PO Take 2 tablets by mouth daily.  ? DULoxetine (CYMBALTA) 30 MG capsule Take 1 capsule (30 mg total) by mouth daily.  ? glipiZIDE (GLUCOTROL) 5 MG tablet Take 2.5 mg by mouth daily.  ? lisinopril (ZESTRIL) 2.5 MG tablet Take 2.5 mg by mouth daily.  ? LYSINE PO Take 1 tablet by mouth daily.  ? MAGNESIUM PO Take by mouth at bedtime.  ? metFORMIN (GLUCOPHAGE-XR) 500 MG 24 hr tablet Take 1,500 mg by mouth daily with breakfast.  ? omeprazole (PRILOSEC) 20 MG capsule Take 20 mg by mouth daily.  ? [DISCONTINUED] diclofenac (VOLTAREN) 75 MG EC tablet Take 1 tablet (75 mg total) by mouth 2 (two) times daily.  ? [DISCONTINUED] QUERCETIN PO Take 1 capsule by mouth daily.  ? diclofenac (VOLTAREN) 75 MG EC tablet Take 1 tablet (75 mg total) by mouth 2 (two) times daily as needed.  ? zinc gluconate 50 MG tablet Take 50 mg by mouth daily.  ? [DISCONTINUED] neomycin-polymyxin b-dexamethasone (MAXITROL) 3.5-10000-0.1 OINT Place 1 application. into both eyes as directed. (Patient not taking: Reported on 08/22/2021)  ? ?No facility-administered encounter medications on file as  of 08/22/2021.  ? ?Past Surgical History:  ?Procedure Laterality Date  ? ANKLE FUSION Left 2009  ? APPENDECTOMY  2009  ? BREAST LUMPECTOMY Left 2008  ? ROTATOR CUFF REPAIR Left 2011  ? SPLENECTOMY  1973  ? TOTAL SHOULDER REPLACEMENT Right 2019  ? ? ?Virtual Visit via MyChart Video:  ? ?I connected with Kassidy Dockendorf Tracey on 08/22/21 via MyChart Video and verified that I am speaking with the correct person using appropriate  identifiers. ?  ?The limitations, risks, security and privacy concerns of performing an evaluation and management service by MyChart Video, including the higher likelihood of inaccurate diagnoses and treatments, and the availability of in person appointments were reviewed. The possible need of an additional face-to-face encounter for complete and high quality delivery of care was discussed. The patient was also made aware that there may be a patient responsible charge related to this service. The patient expressed understanding and wishes to proceed. ? ?Provider location is in medical facility. ?Patient location is at their home, different from provider location. ?People involved in care of the patient during this telehealth encounter were myself, my nurse/medical assistant, and my front office/scheduling team member. ? ?Objective findings:  ? ?General: Speaking full sentences, no audible heavy breathing. Sounds alert and appropriately interactive. Well-appearing. Face symmetric. Extraocular movements intact. Pupils equal and round. No nasal flaring or accessory muscle use visualized. ? ?Independent interpretation of notes and tests performed by another provider:  ? ?None ? ?Pertinent History, Exam, Impression, and Recommendations:  ? ?Plantar fasciitis, right ?This chronic condition has continued to be symptomatic despite recent ultrasound-guided cortisone injection.  This injection was performed on 08/12/2021.  Since then she has been compliant with the duloxetine, has upcoming titration, has noted worsened pain since being off of diclofenac despite initial concern that it was ineffective, lastly she has been utilizing heel cup inserts. ? ?I did discuss that patient would benefit from the upcoming titration of duloxetine, to restart diclofenac on as-needed basis, utilize adjunct acetaminophen, and formal physical therapy.  Additionally, she is to maintain follow-up with rheumatologist given recent positive ANA.  We  will have the patient return as scheduled in 09/2021. ? ?Chronic condition, symptomatic, Rx management ? ?Osteoarthritis of right knee ?Patient did receive intra-articular cortisone injection to the right knee for chronic knee pain in the setting of osteoarthritis, this was performed on 08/12/2021.  She did have mild steroid related symptoms that lasted a few days, but since has noted improvement.  We can repeat these injections in 3 months if indicated. ? ?Positive ANA (antinuclear antibody) ?Noted on recent serum studies given her chronic musculoskeletal pain, rheumatology referral has been placed, she has this visit coming up in April. ? ?Orders & Medications ?Meds ordered this encounter  ?Medications  ? diclofenac (VOLTAREN) 75 MG EC tablet  ?  Sig: Take 1 tablet (75 mg total) by mouth 2 (two) times daily as needed.  ?  Dispense:  60 tablet  ?  Refill:  0  ? ?Orders Placed This Encounter  ?Procedures  ? Ambulatory referral to Physical Therapy  ?  ? ?I discussed the above assessment and treatment plan with the patient. The patient was provided an opportunity to ask questions and all were answered. The patient agreed with the plan and demonstrated an understanding of the instructions. ?  ?The patient was advised to call back or seek an in-person evaluation if the symptoms worsen or if the condition fails to improve as anticipated. ?  ?I  provided a total time of 31 minutes including both face-to-face and non-face-to-face time on 08/22/2021 inclusive of time utilized for medical chart review, information gathering, care coordination with staff, and documentation completion.  Specifically reviewed her clinical course, response to medications, additional interventions, and pending referrals. ?  ? ?Montel Culver, MD ? ? Primary Care Sports Medicine ?[facility] ?[place]  ? ?

## 2021-08-22 NOTE — Telephone Encounter (Signed)
Copied from [place] [id number]. Topic: Quick Communication - Rx Refill/Question ?>> Aug 22, 2021  9:58 AM Erick Blinks wrote: ?Pt called to report that the most recent injection in her foot has not provided relief. Seeking alternative options, please advise ? ?Also, wants to know if there is an alternative to whatever injection she received in her knee it gave her adverse reactions.  ? ?Best contact: [phone] ?

## 2021-08-22 NOTE — Telephone Encounter (Signed)
Please advise 

## 2021-08-22 NOTE — Telephone Encounter (Signed)
Please reivew. ? ?KP

## 2021-08-22 NOTE — Assessment & Plan Note (Signed)
This chronic condition has continued to be symptomatic despite recent ultrasound-guided cortisone injection.  This injection was performed on 08/12/2021.  Since then she has been compliant with the duloxetine, has upcoming titration, has noted worsened pain since being off of diclofenac despite initial concern that it was ineffective, lastly she has been utilizing heel cup inserts. ? ?I did discuss that patient would benefit from the upcoming titration of duloxetine, to restart diclofenac on as-needed basis, utilize adjunct acetaminophen, and formal physical therapy.  Additionally, she is to maintain follow-up with rheumatologist given recent positive ANA.  We will have the patient return as scheduled in 09/2021. ? ?Chronic condition, symptomatic, Rx management ?

## 2021-08-22 NOTE — Telephone Encounter (Signed)
Please advise, see previous message

## 2021-08-22 NOTE — Assessment & Plan Note (Signed)
Patient did receive intra-articular cortisone injection to the right knee for chronic knee pain in the setting of osteoarthritis, this was performed on 08/12/2021.  She did have mild steroid related symptoms that lasted a few days, but since has noted improvement.  We can repeat these injections in 3 months if indicated. ?

## 2021-08-22 NOTE — Patient Instructions (Signed)
-   Restart diclofenac, can dose every 12 hours on as-needed basis (take with food) ?- Can use acetaminophen (Tylenol) [phone] mg every 8 hours for additional pain control ?- Continue duloxetine, can adjust when you dose this medication to minimize drowsiness as discussed ?- Continue heel cup inserts, exercises, and a referral coordinator will contact you in regards to physical therapy scheduling ?- Maintain follow-up with rheumatology ?- Maintain follow-up with our office as scheduled ?- Contact for any questions ?

## 2021-08-22 NOTE — Telephone Encounter (Signed)
Medication Refill - Medication: diclofenac (VOLTAREN) 75 MG EC tablet ? ?Has the patient contacted their pharmacy? Yes.   ?(Agent: If no, request that the patient contact the pharmacy for the refill. If patient does not wish to contact the pharmacy document the reason why and proceed with request.) ?(Agent: If yes, when and what did the pharmacy advise?) ? ?Preferred Pharmacy (with phone number or street name):  ?[id number] [place] [facility] [address]  [id number] ?Phone: [phone]: [phone] ? ?Has the patient been seen for an appointment in the last year OR does the patient have an upcoming appointment? Yes.   ? ?Agent: Please be advised that RX refills may take up to 3 business days. We ask that you follow-up with your pharmacy. ? ?

## 2021-08-22 NOTE — Assessment & Plan Note (Signed)
Noted on recent serum studies given her chronic musculoskeletal pain, rheumatology referral has been placed, she has this visit coming up in April. ?

## 2021-08-25 DIAGNOSIS — M542 Cervicalgia: Secondary | ICD-10-CM | POA: Diagnosis not present

## 2021-08-25 DIAGNOSIS — M9902 Segmental and somatic dysfunction of thoracic region: Secondary | ICD-10-CM | POA: Diagnosis not present

## 2021-08-25 DIAGNOSIS — R519 Headache, unspecified: Secondary | ICD-10-CM | POA: Diagnosis not present

## 2021-08-25 DIAGNOSIS — M9901 Segmental and somatic dysfunction of cervical region: Secondary | ICD-10-CM | POA: Diagnosis not present

## 2021-08-27 DIAGNOSIS — M9901 Segmental and somatic dysfunction of cervical region: Secondary | ICD-10-CM | POA: Diagnosis not present

## 2021-08-27 DIAGNOSIS — M542 Cervicalgia: Secondary | ICD-10-CM | POA: Diagnosis not present

## 2021-08-27 DIAGNOSIS — R519 Headache, unspecified: Secondary | ICD-10-CM | POA: Diagnosis not present

## 2021-08-27 DIAGNOSIS — M9902 Segmental and somatic dysfunction of thoracic region: Secondary | ICD-10-CM | POA: Diagnosis not present

## 2021-09-12 ENCOUNTER — Ambulatory Visit
Admission: EM | Admit: 2021-09-12 | Discharge: 2021-09-12 | Disposition: A | Discharge status: Home / Self Care | Payer: Medicare HMO | Attending: Emergency Medicine | Admitting: Emergency Medicine

## 2021-09-12 DIAGNOSIS — R051 Acute cough: Secondary | ICD-10-CM | POA: Diagnosis not present

## 2021-09-12 DIAGNOSIS — R21 Rash and other nonspecific skin eruption: Secondary | ICD-10-CM | POA: Diagnosis not present

## 2021-09-12 DIAGNOSIS — J069 Acute upper respiratory infection, unspecified: Secondary | ICD-10-CM

## 2021-09-12 DIAGNOSIS — Z20822 Contact with and (suspected) exposure to covid-19: Secondary | ICD-10-CM | POA: Insufficient documentation

## 2021-09-12 DIAGNOSIS — J101 Influenza due to other identified influenza virus with other respiratory manifestations: Secondary | ICD-10-CM | POA: Diagnosis not present

## 2021-09-12 DIAGNOSIS — J029 Acute pharyngitis, unspecified: Secondary | ICD-10-CM | POA: Diagnosis present

## 2021-09-12 LAB — RESP PANEL BY RT-PCR (FLU A&B, COVID) ARPGX2
Influenza A by PCR: NEGATIVE
Influenza B by PCR: POSITIVE — AB
SARS Coronavirus 2 by RT PCR: NEGATIVE

## 2021-09-12 MED ORDER — TRIAMCINOLONE ACETONIDE 0.1 % EX CREA: 1.0000 "application " | TOPICAL_CREAM | Freq: Two times a day (BID) | CUTANEOUS | 0 refills | Status: DC

## 2021-09-12 MED ORDER — BENZONATATE 100 MG PO CAPS: 100.0000 mg | ORAL_CAPSULE | Freq: Three times a day (TID) | ORAL | 0 refills | Status: AC | PRN

## 2021-09-12 NOTE — Discharge Instructions (Signed)
You can take 10 mg of Zyrtec once daily. ?You can take 1 spray of Flonase each side.  ?You can take 2 Tessalon Perles at night before bed. ?Your results from your COVID-19 and influenza testing results were posted MyChart. ?

## 2021-09-12 NOTE — ED Triage Notes (Signed)
Pt reports sore throat, cough, runny nose  and fatigue x 3 days; itching bumps in back x 1 week.  ?

## 2021-09-12 NOTE — ED Provider Notes (Signed)
? ? ?Provider Note ? ?Patient Contact: 12:30 PM (approximate) ? ? ?History  ? ?Sore Throat and Fatigue ? ? ?HPI ? ?Sharon Kaufman is a 71 y.o. female presents to the urgent care with nasal congestion, rhinorrhea, cough and sore throat for the past 3 days since returning from a trip to [place].  Patient also states that she has a pruritic rash on her back.  She denies chest pain, chest tightness or shortness of breath. ? ?  ? ? ?Physical Exam  ? ?Triage Vital Signs: ?ED Triage Vitals  ?Enc Vitals Group  ?   BP 09/12/21 1107 (!) 155/78  ?   Pulse Rate 09/12/21 1107 96  ?   Resp 09/12/21 1107 18  ?   Temp 09/12/21 1107 98.6 ?F (37 ?C)  ?   Temp Source 09/12/21 1107 Oral  ?   SpO2 09/12/21 1107 98 %  ?   Weight --   ?   Height --   ?   Head Circumference --   ?   Peak Flow --   ?   Pain Score 09/12/21 1136 0  ?   Pain Loc --   ?   Pain Edu? --   ?   Excl. in [place]? --   ? ? ?Most recent vital signs: ?Vitals:  ? 09/12/21 1107  ?BP: (!) 155/78  ?Pulse: 96  ?Resp: 18  ?Temp: 98.6 ?F (37 ?C)  ?SpO2: 98%  ? ? ? ?General: Alert and in no acute distress. ?Eyes:  PERRL. EOMI. ?Head: No acute traumatic findings ?ENT: ?     Ears:  ?     Nose: No congestion/rhinnorhea. ?     Mouth/Throat: Mucous membranes are moist. ?Neck: No stridor. No cervical spine tenderness to palpation. ?Cardiovascular:  Good peripheral perfusion ?Respiratory: Normal respiratory effort without tachypnea or retractions. Lungs CTAB. Good air entry to the bases with no decreased or absent breath sounds. ?Gastrointestinal: Bowel sounds ?4 quadrants. Soft and nontender to palpation. No guarding or rigidity. No palpable masses. No distention. No CVA tenderness. ?Musculoskeletal: Full range of motion to all extremities.  ?Neurologic:  No gross focal neurologic deficits are appreciated.  ?Skin: Patient has a linear, erythematous, maculopapular rash along back not in a dermatomal distribution with no vesicle formation. ?Other: ? ? ?ED Results / Procedures /  Treatments  ? ?Labs ?(all labs ordered are listed, but only abnormal results are displayed) ?Labs Reviewed  ?RESP PANEL BY RT-PCR (FLU A&B, COVID) ARPGX2 - Abnormal; Notable for the following components:  ?    Result Value  ? Influenza B by PCR POSITIVE (*)   ? All other components within normal limits  ? ? ? ? ? ? ?PROCEDURES: ? ?Critical Care performed: No ? ?Procedures ? ? ?MEDICATIONS ORDERED IN ED: ?Medications - No data to display ? ? ?IMPRESSION / MDM / ASSESSMENT AND PLAN / ED COURSE  ?I reviewed the triage vital signs and the nursing notes. ?             ?               ? ?Assessment and plan: ?Rash ?Cough:  ? ?Differential diagnosis includes, but is not limited to, rash ?Viral URI ? ?71 year old female presents to the emergency department with viral URI-like symptoms and pruritic rash along the back after returning from a trip to [place].  Patient requested testing for COVID-19.  Testing is in process.  We will treat with Tessalon Perles, Zyrtec and  Flonase and topical triamcinolone for rash. ? ? ?FINAL CLINICAL IMPRESSION(S) / ED DIAGNOSES  ? ?Final diagnoses:  ?Viral URI  ?Rash and nonspecific skin eruption  ? ? ? ?Rx / DC Orders  ? ?ED Discharge Orders   ? ?      Ordered  ?  triamcinolone cream (KENALOG) 0.1 %  2 times daily       ? 09/12/21 1119  ?  benzonatate (TESSALON PERLES) 100 MG capsule  3 times daily PRN       ? 09/12/21 1119  ? ?  ?  ? ?  ? ? ? ?Note:  This document was prepared using Dragon voice recognition software and may include unintentional dictation errors. ?  ?Lannie Fields, PA-C ?09/12/21 1233 ? ?

## 2021-09-15 ENCOUNTER — Ambulatory Visit: Payer: Medicare HMO | Admitting: Physical Therapy

## 2021-09-17 ENCOUNTER — Encounter: Payer: Medicare HMO | Admitting: Physical Therapy

## 2021-09-21 ENCOUNTER — Other Ambulatory Visit: Payer: Self-pay | Admitting: Family Medicine

## 2021-09-21 DIAGNOSIS — G8929 Other chronic pain: Secondary | ICD-10-CM

## 2021-09-22 ENCOUNTER — Encounter: Payer: Medicare HMO | Admitting: Physical Therapy

## 2021-09-22 NOTE — Telephone Encounter (Signed)
Requested medication (s) are due for refill today: yes  ? ?Requested medication (s) are on the active medication list: yes ? ?Last refill:  08/22/21 #60 0 refills ? ?Future visit scheduled: yes 1 week ? ?Notes to clinic:  last ordered by Collins Scotland, MD. Do you want to refill Rx? ? ? ?  ?Requested Prescriptions  ?Pending Prescriptions Disp Refills  ? diclofenac (VOLTAREN) 75 MG EC tablet [Pharmacy Med Name: DICLOFENAC SOD EC 75 MG TAB] 60 tablet 0  ?  Sig: TAKE 1 TABLET BY MOUTH 2 TIMES DAILY AS NEEDED.  ?  ? Analgesics:  NSAIDS Failed - 09/21/2021  9:18 AM  ?  ?  Failed - Manual Review: Labs are only required if the patient has taken medication for more than 8 weeks.  ?  ?  Failed - Cr in normal range and within 360 days  ?  No results found for: CREATININE, LABCREAU, LABCREA, POCCRE  ?  ?  ?  Failed - HGB in normal range and within 360 days  ?  No results found for: HGB, HGBKUC, HGBPOCKUC, HGBOTHER, TOTHGB, HGBPLASMA  ?  ?  ?  Failed - PLT in normal range and within 360 days  ?  No results found for: PLT, PLTCOUNTKUC, LABPLAT, POCPLA  ?  ?  ?  Failed - HCT in normal range and within 360 days  ?  No results found for: HCT, HCTKUC, SRHCT  ?  ?  ?  Failed - eGFR is 30 or above and within 360 days  ?  No results found for: GFRAA, GFRNONAA, GFR, EGFR  ?  ?  ?  Passed - Patient is not pregnant  ?  ?  Passed - Valid encounter within last 12 months  ?  Recent Outpatient Visits   ? ?      ? 1 month ago Osteoarthritis of right knee, unspecified osteoarthritis type  ? [facility] Montel Culver, MD  ? 1 month ago Osteoarthritis of right knee, unspecified osteoarthritis type  ? [place], [place]  ? 1 month ago Plantar fasciitis, right  ? [facility] Montel Culver, MD  ? 1 month ago Chronic pain of right knee  ? [facility] Montel Culver, MD  ? ?  ?  ?Future Appointments   ? ?        ? In 1 week Juline Patch, MD [facility], [place]  ? ?  ? ? ?  ?  ?   ? ?

## 2021-09-23 ENCOUNTER — Ambulatory Visit: Payer: Medicare HMO | Admitting: Family Medicine

## 2021-09-24 ENCOUNTER — Ambulatory Visit: Payer: Medicare HMO | Admitting: Physical Therapy

## 2021-09-29 ENCOUNTER — Encounter: Payer: Medicare HMO | Admitting: Physical Therapy

## 2021-09-30 ENCOUNTER — Encounter: Payer: Self-pay | Admitting: Family Medicine

## 2021-09-30 ENCOUNTER — Ambulatory Visit (INDEPENDENT_AMBULATORY_CARE_PROVIDER_SITE_OTHER): Payer: Medicare HMO | Admitting: Family Medicine

## 2021-09-30 VITALS — BP 122/82 | HR 86 | Ht 63.5 in | Wt 170.0 lb

## 2021-09-30 DIAGNOSIS — E119 Type 2 diabetes mellitus without complications: Secondary | ICD-10-CM | POA: Diagnosis not present

## 2021-09-30 DIAGNOSIS — F419 Anxiety disorder, unspecified: Secondary | ICD-10-CM | POA: Diagnosis not present

## 2021-09-30 DIAGNOSIS — G4709 Other insomnia: Secondary | ICD-10-CM | POA: Diagnosis not present

## 2021-09-30 DIAGNOSIS — Z789 Other specified health status: Secondary | ICD-10-CM

## 2021-09-30 DIAGNOSIS — I10 Essential (primary) hypertension: Secondary | ICD-10-CM | POA: Diagnosis not present

## 2021-09-30 DIAGNOSIS — F32A Depression, unspecified: Secondary | ICD-10-CM | POA: Diagnosis not present

## 2021-09-30 MED ORDER — GLIPIZIDE ER 2.5 MG PO TB24: 2.5000 mg | ORAL_TABLET | Freq: Every day | ORAL | 0 refills | Status: DC

## 2021-09-30 NOTE — Patient Instructions (Signed)
GUIDELINES FOR  ?LOW-CHOLESTEROL, LOW-TRIGLYCERIDE DIETS  ?  ?FOODS TO USE  ? ?MEATS, FISH Choose lean meats (chicken, [place], veal, and non-fatty cuts of beef with excess fat trimmed; one serving = 3 oz of cooked meat). Also, fresh or frozen fish, canned fish packed in water, and shellfish (lobster, crabs, shrimp, and oysters). Limit use to no more than one serving of one of these per week. Shellfish are high in cholesterol but low in saturated fat and should be used sparingly. Meats and fish should be broiled (pan or oven) or baked on a rack.  ?EGGS Egg substitutes and egg whites (use freely). Egg yolks (limit two per week).  ?FRUITS Eat three servings of fresh fruit per day (1 serving = ? cup). Be sure to have at least one citrus fruit daily. Frozen and canned fruit with no sugar or syrup added may be used.  ?VEGETABLES Most vegetables are not limited (see next page). One dark-green (string beans, escarole) or one deep yellow (squash) vegetable is recommended daily. Cauliflower, broccoli, and celery, as well as potato skins, are recommended for their fiber content. (Fiber is associated with cholesterol reduction) It is preferable to steam vegetables, but they may be boiled, strained, or braised with polyunsaturated vegetable oil (see below).  ?BEANS Dried peas or beans (1 serving = ? cup) may be used as a bread substitute.  ?NUTS Almonds, walnuts, and peanuts may be used sparingly  ?(1 serving = 1 Tablespoonful). Use pumpkin, sesame, or sunflower seeds.  ?BREADS, GRAINS One roll or one slice of whole grain or enriched bread may be used, or three soda crackers or four pieces of melba toast as a substitute. Spaghetti, rice or noodles (? cup) or ? large ear of corn may be used as a bread substitute. In preparing these foods do not use butter or shortening, use soft margarine. Also use egg and sugar substitutes.  Choose high fiber grains, such as oats and whole wheat.  ?CEREALS Use ? cup of hot cereal or ? cup of  cold cereal per day. Add a sugar substitute if desired, with 99% fat free or skim milk.  ?MILK PRODUCTS Always use 99% fat free or skim milk, dairy products such as low fat cheeses (farmer's uncreamed diet cottage), low-fat yogurt, and powdered skim milk.  ?FATS, OILS Use soft (not stick) margarine; vegetable oils that are high in polyunsaturated fats (such as safflower, sunflower, soybean, corn, and cottonseed). Always refrigerate meat drippings to harden the fat and remove it before preparing gravies  ?DESSERTS, SNACKS Limit to two servings per day; substitute each serving for a bread/cereal serving: ice milk, water sherbet (1/4 cup); unflavored gelatin or gelatin flavored with sugar substitute (1/3 cup); pudding prepared with skim milk (1/2 cup); egg white souffl?s; unbuttered popcorn (1 ? cups). Substitute carob for chocolate.  ?BEVERAGES Fresh fruit juices (limit 4 oz per day); black coffee, plain or herbal teas; soft drinks with sugar substitutes; club soda, preferably salt-free; cocoa made with skim milk or nonfat dried milk and water (sugar substitute added if desired); clear broth. Alcohol: limit two servings per day (see second page).  ?MISCELLANEOUS ? You may use the following freely: vinegar, spices, herbs, nonfat bouillon, mustard, Worcestershire sauce, soy sauce, flavoring essence.  ? ? ? ? ? ? ? ? ? ? ? ? ? ? ? ? ?GUIDELINES FOR  ?LOW-CHOLESTEROL, LOW TRIGLYCERIDE DIETS  ?  ?FOODS TO AVOID  ? ?MEATS, FISH Marbled beef, pork, bacon, sausage, and other pork products; fatty  fowl (duck, goose); skin and fat of [place] and chicken; processed meats; luncheon meats (salami, bologna); frankfurters and fast-food hamburgers (theyre loaded with fat); organ meats (kidneys, liver); canned fish packed in oil.  ?EGGS Limit egg yolks to two per week.   ?FRUITS Coconuts (rich in saturated fats).  ?VEGETABLES Avoid avocados. Starchy vegetables (potatoes, corn, lima beans, dried peas, beans) may be used only if  substitutes for a serving of bread or cereal. (Baked potato skin, however, is desirable for its fiber content.  ?BEANS Commercial baked beans with sugar and/or pork added.  ?NUTS Avoid nuts.  Limit peanuts and walnuts to one tablespoonful per day.  ?BREADS, GRAINS Any baked goods with shortening and/or sugar. Commercial mixes with dried eggs and whole milk. Avoid sweet rolls, doughnuts, breakfast pastries (Danish), and sweetened packaged cereals (the added sugar converts readily to triglycerides).  ?MILK PRODUCTS Whole milk and whole-milk packaged goods; cream; ice cream; whole-milk puddings, yogurt, or cheeses; nondairy cream substitutes.  ?FATS, OILS Butter, lard, animal fats, bacon drippings, gravies, cream sauces as well as palm and coconut oils. All these are high in saturated fats. Examine labels on cholesterol free products for hydrogenated fats. (These are oils that have been hardened into solids and in the process have become saturated.)  ?DESSERTS, SNACKS Fried snack foods like potato chips; chocolate; candies in general; jams, jellies, syrups; whole- milk puddings; ice cream and milk sherbets; hydrogenated peanut butter.  ?BEVERAGES Sugared fruit juices and soft drinks; cocoa made with whole milk and/or sugar. When using alcohol (1 oz liquor, 5 oz beer, or 2 ? oz dry table wine per serving), one serving must be substituted for one bread or cereal serving (limit, two servings of alcohol per day).  ? SPECIAL NOTES  ?  Remember that even non-limited foods should be used in moderation. ?While on a cholesterol-lowering diet, be sure to avoid animal fats and marbled meats. ?3. While on a triglyceride-lowering diet, be sure to avoid sweets and to control the amount of carbohydrates you eat (starchy foods such as flour, bread, potatoes).While on a tri-glyceride-lowering diet, be sure to avoid sweets ?Buy a good low-fat cookbook, such as the one published by the American Heart Association. ?Consult your physician  if you have any questions.  ? ? ? ? ? ? ? ? ? ? ? ? ? ?[facility] Low Glycemic Diet Plan ? ? ?Low Glycemic Foods (20-49) Moderate Glycemic Foods (50-69) High Glycemic Foods (70-100)  ?    ?Breakfast Creals Breakfast Cereals Breakfast Cereals  ?All Bran All-Bran Fruit'n Oats  ? Bran Buds Bran Chex  ? Cheerios Corn chex  ?  ?Fiber One Oatmeal (not instant)  ? Just Right Mini-Wheats  ? Corn Flakes Cream of Wheat  ?  ?Oat Bran Special K Swiss Muesli  ? Grape Nuts Grape Nut Flakes  ?  ?  Grits Nutri-Grain  ?  ?Fruits and fruit juice: Fruits Puffed Rice Puffed Wheat  ?  ?(Limit to 1-2 Servings per day) Banana (under-ride) Dates  ? Rice Chex Rice Krispies  ?  ?Apples Apricots (fresh/dried)  ? Figs Grapes  ? Shredded Wheat Team  ?  ?Blackberries Blueberries  ? Kiwi Mango  ? Total   ?  ?Cherries Cranberries  ? Oranges Raisins  ?   ?Peaches Pears  ?  Fruits  ?Plums Prunes  ? Fruit Juices Pineapple Watermelon  ?  ?Grapefruit Raspberries  ? Cranberry Juice Orange Juice  ? Banana (over-ripe)   ?  ?Strawberries Tangerines  ?    ?  Apple Juice Grapefruit Juice  ? Beans and Legumes Beverages  ?Tomato Juice   ? [place]-type baked beans Sodas, sweet tea, pineapple juice  ? Canned pinto, kidney, or navy beans   ?Beans and Legumes (fresh-cooked) Green peas Vegetables  ?Black-eyed peas Butter Beans  ?  Potato, baked, boiled, fried, mashed  ?Chick peas Lentils  ? Vegetables French fries  ?Green beans Lima beans  ? Beets Carrots  ? Canned or frozen corn  ?Kidney beans Navy beans  ? Sweet potato Yam  ? Parsnips  ?Pinto beans Snow peas  ? Corn on the cob Winter squash  ?    ?Non-starchy vegetables Grains Breads  ?Asparagus, avocado, broccoli, cabbage Cornmeal Rice, brown  ? Most breads (white and whole grain)  ?cauliflower, celery, cucumber, greens Rice, white Couscous  ? Bagels Bread sticks  ?  ?lettuce, mushrooms, peppers, tomatoes  Bread stuffing Kaiser roll  ?  ?okra, onions, spinach, summer squash Pasta Dinner rolls  ? [place], cheese  ?   ?Grains Ravioli, meat filled Spaghetti, white  ? Grains  ?Barley Bulgur  ?  Rice, instant Tapioca, with milk  ?  ?Rye Wild rice  ? Nuts   ? Cashews Macadamia  ? Candy and most cookies  ?Nuts and o

## 2021-09-30 NOTE — Progress Notes (Signed)
? ? ?Date:  09/30/2021  ? ?Name:  Sharon Kaufman   DOB:  [birth year]   MRN:  [id number] ? ? ?Chief Complaint: Establish Care and Diabetes ? ?Diabetes ?She presents for her follow-up diabetic visit. She has type 2 diabetes mellitus. The initial diagnosis of diabetes was made 15 years ago. Her disease course has been stable. There are no hypoglycemic associated symptoms. Pertinent negatives for hypoglycemia include no confusion, dizziness, headaches, hunger, mood changes, nervousness/anxiousness, pallor, seizures, sleepiness, speech difficulty, sweats or tremors. Associated symptoms include foot paresthesias and polydipsia. Pertinent negatives for diabetes include no blurred vision, no chest pain, no foot ulcerations (15), no polyphagia, no polyuria, no visual change and no weight loss. (nocturia) There are no hypoglycemic complications. Symptoms are stable. There are no diabetic complications. She is following a generally unhealthy diet. Diabetic meal planning: ice cream. She participates in exercise intermittently. Her breakfast blood glucose is taken between 8-9 am. Her breakfast blood glucose range is generally 110-130 mg/dl. An ACE inhibitor/angiotensin II receptor blocker is being taken.  ? ?No results found for: NA, K, CO2, GLUCOSE, BUN, CREATININE, CALCIUM, EGFR, GFRNONAA, GLUCOSE ?No results found for: CHOL, HDL, LDLCALC, LDLDIRECT, TRIG, CHOLHDL ?No results found for: TSH ?No results found for: HGBA1C ?No results found for: WBC, HGB, HCT, MCV, PLT ?No results found for: ALT, AST, GGT, ALKPHOS, BILITOT ?No results found for: 25OHVITD2, [place], VD25OH  ? ?Review of Systems  ?Constitutional:  Negative for weight loss.  ?Eyes:  Negative for blurred vision.  ?Cardiovascular:  Negative for chest pain.  ?Endocrine: Positive for polydipsia. Negative for polyphagia and polyuria.  ?Skin:  Negative for pallor.  ?Neurological:  Negative for dizziness, tremors, seizures, speech difficulty and headaches.   ?Psychiatric/Behavioral:  Negative for confusion. The patient is not nervous/anxious.   ? ?Patient Active Problem List  ? Diagnosis Date Noted  ? Positive ANA (antinuclear antibody) 08/22/2021  ? Chronic musculoskeletal pain 08/05/2021  ? Osteoarthritis (arthritis due to wear and tear of joints) 07/29/2021  ? Osteoarthritis of right knee 07/29/2021  ? Plantar fasciitis, right 07/29/2021  ? 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 Statin intolerance 03/21/2021  ? Acquired trigger finger of right middle finger 03/10/2021  ? Ankylosis of ankle joint, left 03/10/2021  ? Cirrhosis ([place]) 07/25/2020  ? Bronchiectasis without complication ([place]) 78/29/5621  ? Deviated septum 03/22/2020  ? Allergic rhinitis due to pollen 03/22/2020  ? Overweight (BMI 25.0-29.9) 12/06/2019  ? Hypertrophy of inter-atrial septum 10/02/2019  ? History of cardiovascular stress test 10/02/2019  ? Diastolic dysfunction without heart failure 10/02/2019  ? Dyspareunia in female 08/02/2019  ? Cataract, nuclear sclerotic senile, right 07/24/2019  ? Fibromyalgia 07/11/2019  ? History of hepatitis C 07/11/2019  ? Rheumatoid factor positive 06/06/2019  ? Other insomnia 06/06/2019  ? 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  ? 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 Essential hypertension 06/06/2019  ? Diabetes type 2, controlled ([place]) 06/06/2019  ? Anxiety 06/06/2019  ? Age-related osteoporosis without current pathological fracture 01/03/2013  ? 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/2012  ? ? ?Allergies  ?Allergen Reactions  ? Quetiapine Rash  ? Alendronate Other (See Comments)  ?  dyspepsia ?Other reaction(s): Other ?dyspepsia  ? Atorvastatin Other (See Comments)  ?  Other reaction(s): Other ?myalgia ?myalgia ?  ? Codeine Itching and Nausea Only  ? Pravastatin Other (See Comments)  ?  myalgias  ? Hydrocodone-Acetaminophen   ? ? ?Past Surgical History:  ?Procedure Laterality Date  ?  ANKLE FUSION Left 2009  ? APPENDECTOMY   2009  ? BREAST LUMPECTOMY Left 2008  ? ROTATOR CUFF REPAIR Left 2011  ? SPLENECTOMY  1973  ? TOTAL SHOULDER REPLACEMENT Right 2019  ? ? ?Social History  ? ?Tobacco Use  ? Smoking status: Former  ?  Types: Cigarettes  ?  Quit date: 2008  ?  Years since quitting: 15.3  ? Smokeless tobacco: Never  ?Vaping Use  ? Vaping Use: Never used  ?Substance Use Topics  ? Alcohol use: Yes  ?  Alcohol/week: 2.0 standard drinks  ?  Types: 2 Glasses of wine per week  ? Drug use: Never  ? ? ? ?Medication list has been reviewed and updated. ? ?Current Meds  ?Medication Sig  ? Accu-Chek FastClix Lancets MISC 1 each by Other route daily.  ? ACCU-CHEK GUIDE test strip 1 each by Other route daily.  ? Ascorbic Acid (VITAMIN C) 1000 MG tablet Take 1,000 mg by mouth daily.  ? aspirin 81 MG EC tablet Take 81 mg by mouth daily.  ? CALCIUM-VITAMIN D PO Take 2 tablets by mouth daily.  ? Carboxymethylcellul-Glycerin (REFRESH RELIEVA OP) Apply 1 drop to eye daily.  ? cetirizine (ZYRTEC) 10 MG tablet Take 10 mg by mouth daily.  ? diclofenac (VOLTAREN) 75 MG EC tablet TAKE 1 TABLET BY MOUTH 2 TIMES DAILY AS NEEDED.  ? glipiZIDE (GLUCOTROL) 5 MG tablet Take 2.5 mg by mouth daily.  ? lisinopril (ZESTRIL) 2.5 MG tablet Take 2.5 mg by mouth daily.  ? LYSINE PO Take 1 tablet by mouth daily.  ? MAGNESIUM PO Take by mouth at bedtime.  ? metFORMIN (GLUCOPHAGE-XR) 500 MG 24 hr tablet Take 1,500 mg by mouth daily with breakfast.  ? omeprazole (PRILOSEC) 20 MG capsule Take 20 mg by mouth daily.  ? triamcinolone cream (KENALOG) 0.1 % Apply 1 application. topically 2 (two) times daily.  ? zinc gluconate 50 MG tablet Take 50 mg by mouth daily.  ? zolpidem (AMBIEN) 5 MG tablet Take 5 mg by mouth at bedtime as needed for sleep.  ? ? ? ?  09/30/2021  ?  2:25 PM 08/22/2021  ?  2:22 PM 08/12/2021  ? 11:36 AM 08/05/2021  ? 11:31 AM  ?GAD 7 : Generalized Anxiety Score  ?Nervous, Anxious, on Edge 1 1 1 1   ?Control/stop worrying 3 2 2 1   ?Worry too much - different things 3 2 2  1   ?Trouble relaxing 3 1 1 1   ?Restless 0 0 0 0  ?Easily annoyed or irritable 0 0 0 0  ?Afraid - awful might happen 2 0 2 1  ?Total GAD 7 Score 12 6 8 5   ?Anxiety Difficulty Not difficult at all     ? ? ? ?  09/30/2021  ?  2:24 PM  ?Depression screen PHQ 2/9  ?Decreased Interest 1  ?Down, Depressed, Hopeless 1  ?PHQ - 2 Score 2  ?Altered sleeping 3  ?Tired, decreased energy 2  ?Change in appetite 3  ?Feeling bad or failure about yourself  0  ?Trouble concentrating 0  ?Moving slowly or fidgety/restless 0  ?Suicidal thoughts 0  ?PHQ-9 Score 10  ?Difficult doing work/chores Not difficult at all  ? ? ?BP Readings from Last 3 Encounters:  ?09/30/21 122/82  ?09/12/21 (!) 155/78  ?08/12/21 124/82  ? ? ?Physical Exam ? ?Wt Readings from Last 3 Encounters:  ?09/30/21 170 lb (77.1 kg)  ?08/12/21 173 lb 9.6 oz (78.7 kg)  ?08/05/21 173 lb (  78.5 kg)  ? ? ?BP 122/82   Pulse 86   Ht 5' 3.5" (1.613 m)   Wt 170 lb (77.1 kg)   SpO2 98%   BMI 29.64 kg/m?  ? ?Assessment and Plan: ? ?1. Controlled type 2 diabetes mellitus without complication, without long-term current use of insulin ([place]) ?Chronic.  Controlled.  Fasting blood sugars are in the 120 range.  Currently patient is taking metformin XR 1500 mg once a day.  She also is having trouble with dividing 5 mg Glucotrol so we will switch to glipizide XL 2.5 once a day.  We will obtain an A1c and place referral for endocrinology and we need to refer endocrinology okay ? ?- HgB A1c ?- glipiZIDE (GLUCOTROL XL) 2.5 MG 24 hr tablet; Take 1 tablet (2.5 mg total) by mouth daily with breakfast.  Dispense: 90 tablet; Refill: 0 ?- Lipid Panel With LDL/HDL Ratio ? ?2. Essential hypertension ?Chronic.  Controlled.  Stable.  Blood pressure 122/82.  Patient will continue lisinopril 2.5 mg once a day.  Will check renal function panel for GFR and electrolytes.  Salt ring in this ?- Renal Function Panel ? ?3. Anxiety and depression ?New onset.  Persistent.  Stable.  PHQ is 10 Gad score is 12.  We  will refer to psychiatry for evaluation and treatment of anxiety and depression as well as dealing with her insomnia for which she wants to resume her Ambien that we prefer not to prescribe because of dependency. ?- Ambulat

## 2021-10-01 ENCOUNTER — Encounter: Payer: Medicare HMO | Admitting: Physical Therapy

## 2021-10-03 ENCOUNTER — Encounter: Payer: Self-pay | Admitting: Family Medicine

## 2021-10-03 ENCOUNTER — Ambulatory Visit (INDEPENDENT_AMBULATORY_CARE_PROVIDER_SITE_OTHER): Payer: Medicare HMO | Admitting: Family Medicine

## 2021-10-03 VITALS — Ht 63.5 in | Wt 170.0 lb

## 2021-10-03 DIAGNOSIS — M7671 Peroneal tendinitis, right leg: Secondary | ICD-10-CM | POA: Diagnosis not present

## 2021-10-03 DIAGNOSIS — M722 Plantar fascial fibromatosis: Secondary | ICD-10-CM

## 2021-10-03 MED ORDER — TRAMADOL HCL 50 MG PO TABS: 50.0000 mg | ORAL_TABLET | Freq: Three times a day (TID) | ORAL | 0 refills | Status: AC | PRN

## 2021-10-03 NOTE — Assessment & Plan Note (Signed)
Patient also presents for follow-up to chronic heel pain in the setting of plantar fasciitis, while she states that the heel and arch have less pain, she is still asymptomatic, does have physical therapy scheduled for next month.  From examination standpoint she is demonstrating tenderness, interval improved at the calcaneus.  I have advised her to continue medication regimen, start PT once scheduled, and perform home exercises. ?

## 2021-10-03 NOTE — Patient Instructions (Signed)
-   Continue diclofenac every 12 hours scheduled until follow-up (take with food) ?- Wear cam boot at all times while you are on your feet, okay to perform weightbearing as tolerated while in the boot ?- Utilize ice at 20-minute intervals throughout the day for additional pain control ?- Can use tramadol sparingly for breakthrough pain ?- Start home exercises early next week, focus on gentle range of motion and advance as you can tolerate ?- Keep scheduled follow-up for next week, contact for any questions between now and then ?

## 2021-10-03 NOTE — Assessment & Plan Note (Signed)
Patient with known chronic history of right foot and ankle pain presenting with acute lateral foot/ankle pain while descending the stairs earlier today (around noon).  Did appreciate a popping sensation, denies any swelling or ecchymosis, had difficulty bearing weight due to pain, was advised to initiate cam boot usage, and utilized a wheelchair for mobility. ? ?Examination reveals foot/ankle benign to inspection without swelling or ecchymosis, she demonstrates full active range of motion with mild pain during maximal inversion and eversion, resisted eversion and plantarflexion are maximally painful recreate her symptoms, she is nontender throughout the gastrocnemius, minimally tender at the Achilles insertion, mildly tender at the plantar fascia, maximally tender at the peroneal tendons inferior and posterior to the lateral malleolus, mild tenderness about the ATFL and PT FL, negative drawer test, Thompson test benign, negative Homans' sign, nontender of the bony prominences, talar tilt test negative. ? ?Given her clinical history, findings today, concern for grade 2 injury to the peroneal tendons, cannot exclude an element of lateral ligamentous involvement.  I have advised weightbearing as tolerated while in the cam boot, home-based exercises, and transition to lace up ASO brace if able to do so and as symptoms allow.  She will return for close follow-up in 1 week and if still having difficulty with weightbearing, plan for x-rays.  Over the interim due to acuity of symptoms, short course of tramadol written for patient, I did stress that if this medication needs to be refilled, it would have to be coordinated through pain management, she vocalized understanding and is amenable to this plan. ?

## 2021-10-03 NOTE — Progress Notes (Signed)
?  ? ?Primary Care / Sports Medicine Office Visit ? ?Patient Information:  ?Patient ID: Sharon Kaufman, female DOB: June 05, 1950 Age: 71 y.o. MRN: [id number]  ? ?Sharon Kaufman is a pleasant 71 y.o. female presenting with the following: ? ?Chief Complaint  ?Patient presents with  ? Ankle Pain  ?  X1 day,Right ankle, something popped when walking down steps  ? ? ?There were no vitals filed for this visit. ?Vitals:  ? 10/03/21 1404  ?Weight: 170 lb (77.1 kg)  ?Height: 5' 3.5" (1.613 m)  ? ?Body mass index is 29.64 kg/m?. ? ?No results found.  ? ?Independent interpretation of notes and tests performed by another provider:  ? ?None ? ?Procedures performed:  ? ?None ? ?Pertinent History, Exam, Impression, and Recommendations:  ? ?Problem List Items Addressed This Visit   ? ?  ? Musculoskeletal and Integument  ? Plantar fasciitis, right  ?  Patient also presents for follow-up to chronic heel pain in the setting of plantar fasciitis, while she states that the heel and arch have less pain, she is still asymptomatic, does have physical therapy scheduled for next month.  From examination standpoint she is demonstrating tenderness, interval improved at the calcaneus.  I have advised her to continue medication regimen, start PT once scheduled, and perform home exercises. ? ?  ?  ? Peroneal tendinitis, right - Primary  ?  Patient with known chronic history of right foot and ankle pain presenting with acute lateral foot/ankle pain while descending the stairs earlier today (around noon).  Did appreciate a popping sensation, denies any swelling or ecchymosis, had difficulty bearing weight due to pain, was advised to initiate cam boot usage, and utilized a wheelchair for mobility. ? ?Examination reveals foot/ankle benign to inspection without swelling or ecchymosis, she demonstrates full active range of motion with mild pain during maximal inversion and eversion, resisted eversion and plantarflexion are maximally painful recreate  her symptoms, she is nontender throughout the gastrocnemius, minimally tender at the Achilles insertion, mildly tender at the plantar fascia, maximally tender at the peroneal tendons inferior and posterior to the lateral malleolus, mild tenderness about the ATFL and PT FL, negative drawer test, Thompson test benign, negative Homans' sign, nontender of the bony prominences, talar tilt test negative. ? ?Given her clinical history, findings today, concern for grade 2 injury to the peroneal tendons, cannot exclude an element of lateral ligamentous involvement.  I have advised weightbearing as tolerated while in the cam boot, home-based exercises, and transition to lace up ASO brace if able to do so and as symptoms allow.  She will return for close follow-up in 1 week and if still having difficulty with weightbearing, plan for x-rays.  Over the interim due to acuity of symptoms, short course of tramadol written for patient, I did stress that if this medication needs to be refilled, it would have to be coordinated through pain management, she vocalized understanding and is amenable to this plan. ? ?  ?  ? Relevant Medications  ? traMADol (ULTRAM) 50 MG tablet  ?  ? ?Orders & Medications ?Meds ordered this encounter  ?Medications  ? traMADol (ULTRAM) 50 MG tablet  ?  Sig: Take 1 tablet (50 mg total) by mouth every 8 (eight) hours as needed for up to 5 days.  ?  Dispense:  15 tablet  ?  Refill:  0  ? ?No orders of the defined types were placed in this encounter. ?  ? ?No follow-ups on file.  ?  ? ?  Montel Culver, MD ? ? Primary Care Sports Medicine ?[facility] ?[place]  ? ?

## 2021-10-06 ENCOUNTER — Encounter: Payer: Medicare HMO | Admitting: Physical Therapy

## 2021-10-08 ENCOUNTER — Encounter: Payer: Medicare HMO | Admitting: Physical Therapy

## 2021-10-09 ENCOUNTER — Telehealth: Payer: Self-pay

## 2021-10-09 ENCOUNTER — Telehealth: Payer: Self-pay | Admitting: Family Medicine

## 2021-10-09 ENCOUNTER — Ambulatory Visit (INDEPENDENT_AMBULATORY_CARE_PROVIDER_SITE_OTHER): Payer: Medicare HMO | Admitting: Family Medicine

## 2021-10-09 ENCOUNTER — Encounter: Payer: Self-pay | Admitting: Family Medicine

## 2021-10-09 VITALS — BP 118/86 | HR 92 | Ht 63.5 in | Wt 170.0 lb

## 2021-10-09 DIAGNOSIS — M7918 Myalgia, other site: Secondary | ICD-10-CM

## 2021-10-09 DIAGNOSIS — E119 Type 2 diabetes mellitus without complications: Secondary | ICD-10-CM | POA: Diagnosis not present

## 2021-10-09 DIAGNOSIS — G8929 Other chronic pain: Secondary | ICD-10-CM

## 2021-10-09 DIAGNOSIS — I10 Essential (primary) hypertension: Secondary | ICD-10-CM | POA: Diagnosis not present

## 2021-10-09 DIAGNOSIS — Z789 Other specified health status: Secondary | ICD-10-CM | POA: Diagnosis not present

## 2021-10-09 DIAGNOSIS — M7671 Peroneal tendinitis, right leg: Secondary | ICD-10-CM

## 2021-10-09 MED ORDER — FLUOXETINE HCL 20 MG PO CAPS: 20.0000 mg | ORAL_CAPSULE | Freq: Every day | ORAL | 2 refills | Status: DC

## 2021-10-09 MED ORDER — AMITRIPTYLINE HCL 10 MG PO TABS: 10.0000 mg | ORAL_TABLET | Freq: Every day | ORAL | 2 refills | Status: DC

## 2021-10-09 NOTE — Assessment & Plan Note (Signed)
Patient returns for follow-up to right peroneal tendinopathy with concern for grade 2 injury.  She has been compliant with cam boot, spending some time ambulating using a slipper with increased pain, but does endorse overall improvement.  Examination reveals no swelling, near full, painful range of motion at the ankle, scant ecchymosis at the lateral hindfoot, and stable/improved tenderness along the peroneal tendons insertionally.  Her findings are again consistent with prior concern for grade 2 peroneal tendon injury at the right foot/ankle, I have advised gradual wean and discontinuation of cam boot with a transition to lace up ASO brace, gradually advance home exercises as symptoms allow, and criteria for discontinuation of lace up ASO brace reviewed.

## 2021-10-09 NOTE — Assessment & Plan Note (Signed)
We also discussed patient's chronic musculoskeletal pain with a concern for fibromyalgia, she was unable to tolerate duloxetine due to excessive drowsiness.  This comorbid condition is chronic founding our treatment for her constellation of musculoskeletal concerns and I discussed pharmacologic and nonpharmacologic methods to address this.  She is amenable to initiate amitriptyline 10 mg and fluoxetine 20 mg daily with fibromyalgia dosing guidelines.  I discussed the importance of adherence to physical therapy and the need for close follow-up.  She will return in 2 months for reevaluation, she was advised to contact us for any questions/concerns over the interim.

## 2021-10-09 NOTE — Telephone Encounter (Signed)
Called [facility] on 10/09/21 to check the status of referral. Unable to leave a message.

## 2021-10-09 NOTE — Patient Instructions (Signed)
-   Start amitriptyline 10 mg 1-3 hours before bedtime - Start fluoxetine 20 mg daily - Gradually wean from cam boot and transition to lace up ASO brace - Continue working on home exercises using foot/ankle symptoms as a guide - Once you are able to consistently complete home exercises without pain, can discontinue the brace - Keep scheduled visits with physical therapy - Return for follow-up in 2 months, contact us for any questions between now and then

## 2021-10-09 NOTE — Telephone Encounter (Signed)
I called pt concerning her not hearing from her referral to psych. Lexine Baton tried to call and check on the referral and [facility] did not answer the phone. Therefore, she will forward the referral to [facility] in [place] and I have asked pt to call on Tuesday if she has not heard anything.

## 2021-10-09 NOTE — Telephone Encounter (Signed)
Patient asked that you call her. She has not heard anything back from psychiatry.

## 2021-10-09 NOTE — Progress Notes (Signed)
     Primary Care / Sports Medicine Office Visit  Patient Information:  Patient ID: [name], female DOB: [birth year] Age: 72 y.o. MRN: [id number]   Sharon Kaufman is a pleasant 71 y.o. female presenting with the following:  Chief Complaint  Patient presents with   Peroneal tendinitis right    Vitals:   10/09/21 1457  BP: 118/86  Pulse: 92  SpO2: 99%   Vitals:   10/09/21 1457  Weight: 170 lb (77.1 kg)  Height: 5' 3.5" (1.613 m)   Body mass index is 29.64 kg/m.  No results found.   Independent interpretation of notes and tests performed by another provider:   None  Procedures performed:   None  Pertinent History, Exam, Impression, and Recommendations:   Problem List Items Addressed This Visit       Musculoskeletal and Integument   Peroneal tendinitis, right - Primary    Patient returns for follow-up to right peroneal tendinopathy with concern for grade 2 injury.  She has been compliant with cam boot, spending some time ambulating using a slipper with increased pain, but does endorse overall improvement.  Examination reveals no swelling, near full, painful range of motion at the ankle, scant ecchymosis at the lateral hindfoot, and stable/improved tenderness along the peroneal tendons insertionally.  Her findings are again consistent with prior concern for grade 2 peroneal tendon injury at the right foot/ankle, I have advised gradual wean and discontinuation of cam boot with a transition to lace up ASO brace, gradually advance home exercises as symptoms allow, and criteria for discontinuation of lace up ASO brace reviewed.         Other   Chronic musculoskeletal pain    We also discussed patient's chronic musculoskeletal pain with a concern for fibromyalgia, she was unable to tolerate duloxetine due to excessive drowsiness.  This comorbid condition is chronic founding our treatment for her constellation of musculoskeletal concerns and I discussed  pharmacologic and nonpharmacologic methods to address this.  She is amenable to initiate amitriptyline 10 mg and fluoxetine 20 mg daily with fibromyalgia dosing guidelines.  I discussed the importance of adherence to physical therapy and the need for close follow-up.  She will return in 2 months for reevaluation, she was advised to contact us for any questions/concerns over the interim.       Relevant Medications   amitriptyline (ELAVIL) 10 MG tablet   FLUoxetine (PROZAC) 20 MG capsule     Orders & Medications Meds ordered this encounter  Medications   amitriptyline (ELAVIL) 10 MG tablet    Sig: Take 1 tablet (10 mg total) by mouth at bedtime.    Dispense:  30 tablet    Refill:  2   FLUoxetine (PROZAC) 20 MG capsule    Sig: Take 1 capsule (20 mg total) by mouth daily.    Dispense:  30 capsule    Refill:  2   No orders of the defined types were placed in this encounter.    Return in about 2 months (around 12/09/2021).     Montel Culver, MD   Primary Care Sports [facility] [place]

## 2021-10-10 LAB — LIPID PANEL WITH LDL/HDL RATIO
Cholesterol, Total: 239 mg/dL — ABNORMAL HIGH (ref 100–199)
HDL: 69 mg/dL (ref >39)
LDL Chol Calc (NIH): 149 mg/dL — ABNORMAL HIGH (ref 0–99)
LDL/HDL Ratio: 2.2 ratio (ref 0.0–3.2)
Triglycerides: 118 mg/dL (ref 0–149)
VLDL Cholesterol Cal: 21 mg/dL (ref 5–40)

## 2021-10-10 LAB — RENAL FUNCTION PANEL
Albumin: 4.8 g/dL (ref 3.8–4.8)
BUN/Creatinine Ratio: 25 (ref 12–28)
BUN: 25 mg/dL (ref 8–27)
CO2: 20 mmol/L (ref 20–29)
Calcium: 11.3 mg/dL — ABNORMAL HIGH (ref 8.7–10.3)
Chloride: 98 mmol/L (ref 96–106)
Creatinine, Ser: 1.01 mg/dL — ABNORMAL HIGH (ref 0.57–1.00)
Glucose: 102 mg/dL — ABNORMAL HIGH (ref 70–99)
Phosphorus: 4.8 mg/dL — ABNORMAL HIGH (ref 3.0–4.3)
Potassium: 5.3 mmol/L — ABNORMAL HIGH (ref 3.5–5.2)
Sodium: 137 mmol/L (ref 134–144)
eGFR: 60 mL/min/{1.73_m2} (ref >59)

## 2021-10-10 LAB — HEMOGLOBIN A1C
Est. average glucose Bld gHb Est-mCnc: 126 mg/dL
Hgb A1c MFr Bld: 6 % — ABNORMAL HIGH (ref 4.8–5.6)

## 2021-10-13 ENCOUNTER — Other Ambulatory Visit: Payer: Self-pay

## 2021-10-13 ENCOUNTER — Encounter: Payer: Medicare HMO | Admitting: Physical Therapy

## 2021-10-13 DIAGNOSIS — E7800 Pure hypercholesterolemia, unspecified: Secondary | ICD-10-CM

## 2021-10-13 MED ORDER — EZETIMIBE 10 MG PO TABS: 10.0000 mg | ORAL_TABLET | Freq: Every day | ORAL | 1 refills | Status: DC

## 2021-10-13 NOTE — Progress Notes (Signed)
Printed lab PTH

## 2021-10-15 ENCOUNTER — Encounter: Payer: Medicare HMO | Admitting: Physical Therapy

## 2021-10-16 ENCOUNTER — Encounter: Payer: Self-pay | Admitting: Family Medicine

## 2021-10-16 ENCOUNTER — Ambulatory Visit (INDEPENDENT_AMBULATORY_CARE_PROVIDER_SITE_OTHER): Payer: Medicare HMO | Admitting: Family Medicine

## 2021-10-16 VITALS — BP 130/90 | HR 89

## 2021-10-16 DIAGNOSIS — G8929 Other chronic pain: Secondary | ICD-10-CM | POA: Diagnosis not present

## 2021-10-16 DIAGNOSIS — M722 Plantar fascial fibromatosis: Secondary | ICD-10-CM | POA: Diagnosis not present

## 2021-10-16 DIAGNOSIS — M7918 Myalgia, other site: Secondary | ICD-10-CM

## 2021-10-16 DIAGNOSIS — M7671 Peroneal tendinitis, right leg: Secondary | ICD-10-CM

## 2021-10-16 MED ORDER — NABUMETONE 500 MG PO TABS: 500.0000 mg | ORAL_TABLET | Freq: Two times a day (BID) | ORAL | 0 refills | Status: DC | PRN

## 2021-10-16 NOTE — Assessment & Plan Note (Signed)
Patient presents for work and follow-up visit due to persistent pain, she has successfully wean from cam boot but could not tolerate lace up brace due to heel pain, using slippers daily.  Her examination is stable compared to last week.  Given her recalcitrant pain, plan for MRI right foot and ankle, over the interim we have modified diclofenac to nabumetone twice daily as needed, I encouraged her to try to utilize brace as often as she can and to utilize heel cup insert in addition to activity modification.  She does have upcoming PT which I have encouraged her to maintain as well.

## 2021-10-16 NOTE — Patient Instructions (Signed)
-   Increase amitriptyline to 20 mg after 1 week - Start nabumetone twice daily on an as-needed basis (take with food) - Do your best to wear ankle brace throughout the day, use silicone heel cushion as well - Start physical therapy as scheduled - Referral coordinator will contact you in regards to MRI scheduling - Maintain follow-up as scheduled, contact for any questions/concerns between now and then

## 2021-10-16 NOTE — Progress Notes (Signed)
Primary Care / Sports Medicine Office Visit  Patient Information:  Patient ID: MATRICIA BEGNAUD, female DOB: 1951/03/08 Age: 71 y.o. MRN: [id number]   Sharon Kaufman is a pleasant 71 y.o. female presenting with the following:  Chief Complaint  Patient presents with   Peroneal tendinitis    Right, still in pain, was here to get blood drawn so she figured that she would come in and try to get stronger medication to help with pain.    Vitals:   10/16/21 1505  BP: 130/90  Pulse: 89  SpO2: 96%   There were no vitals filed for this visit. There is no height or weight on file to calculate BMI.  No results found.   Independent interpretation of notes and tests performed by another provider:   None  Procedures performed:   None  Pertinent History, Exam, Impression, and Recommendations:   Problem List Items Addressed This Visit       Musculoskeletal and Integument   Plantar fasciitis, right   Relevant Medications   nabumetone (RELAFEN) 500 MG tablet   Other Relevant Orders   MR FOOT RIGHT WO CONTRAST   Peroneal tendinitis, right - Primary    Patient presents for work and follow-up visit due to persistent pain, she has successfully wean from cam boot but could not tolerate lace up brace due to heel pain, using slippers daily.  Her examination is stable compared to last week.  Given her recalcitrant pain, plan for MRI right foot and ankle, over the interim we have modified diclofenac to nabumetone twice daily as needed, I encouraged her to try to utilize brace as often as she can and to utilize heel cup insert in addition to activity modification.  She does have upcoming PT which I have encouraged her to maintain as well.       Relevant Medications   nabumetone (RELAFEN) 500 MG tablet   Other Relevant Orders   MR ANKLE RIGHT WO CONTRAST   MR FOOT RIGHT WO CONTRAST     Other   Chronic musculoskeletal pain    Patient returns for right ankle pain follow-up as a work in  due to persistent pain.  She has initiated both amitriptyline 10 mg and fluoxetine 20 mg and does have upcoming physical therapy scheduled for early June.  From a medication management standpoint I discussed further titration of amitriptyline to 20 mg after 1 week.  She has upcoming visits with both psychiatry and rheumatology whose input can be beneficial in this matter.       Relevant Medications   nabumetone (RELAFEN) 500 MG tablet   I provided a total time of 43 minutes including both face-to-face and non-face-to-face time on 10/16/2021 inclusive of time utilized for medical chart review, information gathering, care coordination with staff, and documentation completion.  Specifically reviewed the nature of her clinical course, need for additional testing, role of new medications.    Orders & Medications Meds ordered this encounter  Medications   nabumetone (RELAFEN) 500 MG tablet    Sig: Take 1 tablet (500 mg total) by mouth 2 (two) times daily as needed.    Dispense:  60 tablet    Refill:  0   Orders Placed This Encounter  Procedures   MR ANKLE RIGHT WO CONTRAST   MR FOOT RIGHT WO CONTRAST     No follow-ups on file.     Montel Culver, MD   [place]  [place]

## 2021-10-16 NOTE — Assessment & Plan Note (Signed)
Patient returns for right ankle pain follow-up as a work in due to persistent pain.  She has initiated both amitriptyline 10 mg and fluoxetine 20 mg and does have upcoming physical therapy scheduled for early June.  From a medication management standpoint I discussed further titration of amitriptyline to 20 mg after 1 week.  She has upcoming visits with both psychiatry and rheumatology whose input can be beneficial in this matter.

## 2021-10-17 LAB — PTH, INTACT AND CALCIUM
Calcium: 10.6 mg/dL — ABNORMAL HIGH (ref 8.7–10.3)
PTH: 9 pg/mL — ABNORMAL LOW (ref 15–65)

## 2021-10-22 ENCOUNTER — Encounter: Payer: Medicare HMO | Admitting: Physical Therapy

## 2021-10-23 ENCOUNTER — Ambulatory Visit: Payer: Self-pay | Admitting: Family Medicine

## 2021-10-27 ENCOUNTER — Ambulatory Visit: Payer: Medicare HMO | Admitting: Physical Therapy

## 2021-10-29 ENCOUNTER — Encounter: Payer: Medicare HMO | Admitting: Physical Therapy

## 2021-10-30 ENCOUNTER — Telehealth: Payer: Self-pay | Admitting: Family Medicine

## 2021-10-30 DIAGNOSIS — M2550 Pain in unspecified joint: Secondary | ICD-10-CM | POA: Diagnosis not present

## 2021-10-30 DIAGNOSIS — M797 Fibromyalgia: Secondary | ICD-10-CM | POA: Diagnosis not present

## 2021-10-30 NOTE — Telephone Encounter (Signed)
Copied from [place] [phone]. Topic: General - Other >> Oct 30, 2021  9:57 AM Oley Balm E wrote: Reason for CRM: Pt wants a call back from the clinic to discuss if she still needs to have an MRI because her foot feels better  Best contact: [phone]

## 2021-10-30 NOTE — Telephone Encounter (Signed)
PC to pt, states she is feeling much better starts PT next week. Advised to not get MRI until further notice she voiced understanding and will follow up as needed.

## 2021-10-30 NOTE — Telephone Encounter (Signed)
Please let patient know that I am happy to hear that she is doing better - she does not need to get the MRI as that was to look for causes of why she would not be doing better.  We can revisit MRI in the future if we ever need to but continue her current treatments that are translating into improvement.

## 2021-10-30 NOTE — Telephone Encounter (Signed)
Please advise 

## 2021-10-31 ENCOUNTER — Ambulatory Visit: Payer: Medicare HMO

## 2021-11-03 ENCOUNTER — Ambulatory Visit: Payer: Medicare HMO | Attending: Family Medicine | Admitting: Physical Therapy

## 2021-11-03 DIAGNOSIS — M6281 Muscle weakness (generalized): Secondary | ICD-10-CM | POA: Diagnosis not present

## 2021-11-03 DIAGNOSIS — M25571 Pain in right ankle and joints of right foot: Secondary | ICD-10-CM | POA: Insufficient documentation

## 2021-11-03 DIAGNOSIS — G8929 Other chronic pain: Secondary | ICD-10-CM | POA: Diagnosis not present

## 2021-11-03 DIAGNOSIS — M1711 Unilateral primary osteoarthritis, right knee: Secondary | ICD-10-CM | POA: Diagnosis not present

## 2021-11-03 DIAGNOSIS — M722 Plantar fascial fibromatosis: Secondary | ICD-10-CM | POA: Insufficient documentation

## 2021-11-03 DIAGNOSIS — R262 Difficulty in walking, not elsewhere classified: Secondary | ICD-10-CM | POA: Diagnosis not present

## 2021-11-03 DIAGNOSIS — M25561 Pain in right knee: Secondary | ICD-10-CM | POA: Diagnosis not present

## 2021-11-03 NOTE — Therapy (Unsigned)
OUTPATIENT PHYSICAL THERAPY LOWER EXTREMITY EVALUATION   Patient Name: Sharon Kaufman MRN: [id number] DOB:October 31, 1950, 71 y.o., female Today's Date: 11/05/2021   PT End of Session - 11/05/21 0812     Visit Number 1    Number of Visits 13    Date for PT Re-Evaluation 12/15/21    Authorization Type [employer], VL based on auth    Progress Note Due on Visit 10    PT Start Time 1146    PT Stop Time 1238    PT Time Calculation (min) 52 min    Activity Tolerance Patient tolerated treatment well;No increased pain    Behavior During Therapy WFL for tasks assessed/performed              Past Medical History:  Diagnosis Date   Allergy    Anxiety    Breast cancer, left ([place]) 2008   Diabetes mellitus without complication ([place])    GERD (gastroesophageal reflux disease)    Hyperlipidemia    Hypertension    Past Surgical History:  Procedure Laterality Date   ANKLE FUSION Left 2009   APPENDECTOMY  2009   BREAST LUMPECTOMY Left 2008   ROTATOR CUFF REPAIR Left 2011   SPLENECTOMY  1973   TOTAL SHOULDER REPLACEMENT Right 2019   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   Statin intolerance 03/21/2021   Acquired trigger finger of right middle finger 03/10/2021   Ankylosis of ankle joint, left 03/10/2021   Cirrhosis ([place]) 07/25/2020   Bronchiectasis without complication ([place]) 57/84/6962   Deviated septum 03/22/2020   Allergic rhinitis due to pollen 03/22/2020   Overweight (BMI 25.0-29.9) 12/06/2019   Hypertrophy of inter-atrial septum 10/02/2019   History of cardiovascular stress test 95/28/4132   Diastolic dysfunction without heart  failure 10/02/2019   Dyspareunia in female 08/02/2019   Cataract, nuclear sclerotic senile, right 07/24/2019   Fibromyalgia 07/11/2019   History of hepatitis C 07/11/2019   Rheumatoid factor positive 06/06/2019   Other insomnia 06/06/2019   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   Essential hypertension 06/06/2019   Diabetes type 2, controlled ([place]) 06/06/2019   Anxiety 06/06/2019   Age-related osteoporosis without current pathological fracture 01/03/2013   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/2012    PCP: Juline Patch, MD  REFERRING PROVIDER: Montel Culver, MD  REFERRING DIAG:  [phone] (ICD-10-CM) - Chronic pain of right knee  M17.11 (ICD-10-CM) - Osteoarthritis of right knee, unspecified osteoarthritis type  M72.2 (ICD-10-CM) - Plantar fasciitis, right    THERAPY DIAG:  Pain in right ankle and joints of right foot  Chronic pain of right knee  Difficulty in walking, not elsewhere classified  Muscle weakness (generalized)  Rationale for Evaluation and Treatment Rehabilitation  ONSET DATE: 09/17/21  SUBJECTIVE:   SUBJECTIVE STATEMENT: Patient is a 71 year old female referred for chronic R knee pain and R foot/ankle pain. R foot injury 6.5 weeks ago with referring diagnosis of grade 2 tear of peroneal tendons. Referring diagnosis of R knee OA.   PERTINENT HISTORY: Patient is a 71 year old female referred for chronic R knee pain and R foot/ankle pain. Patient reports that  she's had chronic issues with R plantar fasciitis. She was going to come to PT for these issues, but she had pop and pain with stepping down her garage steps 6.5 weeks ago. Dr. Zigmund Daniel discussed with patient grade 2 peroneal tear. Patient feels that previous injection treatments did not help. Pt is continuing diclofenac and amitriptyline per referring provider. Patient had recent new diagnosis of fibromyalgia. Hx of L ankle fusion.  Patient reports pain along R lateral ankle, she had notable ecchymosis along R lateral ankle/lateral malleolus. Patient reports previously having pain along arch of her R foot that has improved with exercises given by Dr. Zigmund Daniel (ankle/foot conditioning program). Patient denies paresthesias or numbness. No fracture or acute osseous abnormality noted in foot or ankle X-rays. Pt reports that ankle brace bothers her lateral ankle and she does not tolerate wearing it presently. Pt is wearing heel cup inserts and feels they help. 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 Patient tolerates standing up to 10 minutes presently. Pt denies disturbed sleep presently due to ankle pain.   PAIN:  Are you having pain? Yes: NPRS scale: 3/10, 5-6/10 at worst, 1 at best;  Pain location: Lateral ankle/lateral malleolus, plantar aspect of foot Pain description: throbbing, sore Aggravating factors: prolonged weightbearing, prolonged walking Relieving factors: ice, first thing in morning/non-weightbearing, diclofenac sodium.   PRECAUTIONS: None  WEIGHT BEARING RESTRICTIONS No  FALLS:  Has patient fallen in last 6 months? No  LIVING ENVIRONMENT: Lives with: lives alone, home is one level with exception of one bedroom upstairs (goes up stairs 1x/day) Lives in: House/apartment Stairs: Yes: External: 7 steps; can reach both Has following equipment at home: Walker - 2 wheeled  OCCUPATION: Pet sitting business  PLOF: [place], able to walk better    OBJECTIVE:   DIAGNOSTIC FINDINGS: X-ray of R foot  07/29/21 IMPRESSION: No recent fracture or dislocation is seen in the right foot. Hallux valgus deformity is noted. Possible minimal calcifications are seen in the plantar fascia suggesting possible chronic inflammation.  PATIENT SURVEYS:  FOTO 33 , predicated goal score of 51  COGNITION:  Overall cognitive status: Within functional limits for tasks  assessed     SENSATION: Not tested  EDEMA:  Moderate pitting along R lateral malleolus. Minimal increase in girth per gross visual inspection.   MUSCLE LENGTH: Gastrocnemius: R Poor, L Poor    PALPATION: Tenderness to palpation along R lateral malleolus, R ATFL, R ACL, R peroneal tendon posterior to lateral malleolus   LOWER EXTREMITY ROM:  Active ROM Right eval Left (fused ankle) eval  Hip flexion    Hip extension    Hip abduction    Hip adduction    Hip internal rotation    Hip external rotation    Knee flexion    Knee extension    Ankle dorsiflexion 7 1  Ankle plantarflexion WNL 15  Ankle inversion 42 22  Ankle eversion 20 18   (Blank rows = not tested)  Great toe PROM R 85, L 85  LOWER EXTREMITY MMT:  MMT Right eval Left eval  Hip flexion    Hip extension    Hip abduction    Hip adduction    Hip internal rotation    Hip external rotation    Knee flexion    Knee extension    Ankle dorsiflexion 5 4+  Ankle plantarflexion 4- 4-  Ankle inversion 5 4  Ankle eversion 4+ 5   (Blank rows =  not tested)  LOWER EXTREMITY SPECIAL TESTS:  Ankle special tests: Anterior drawer test: positive  and Talar tilt test: negative, Dorsiflexion-eversion: negative. Inversion stress test: negative. Eversion stress test: negative.   FUNCTIONAL TESTS:  FOTO 33  GAIT: Comments: Mild decreased stance time RLE, early heel rise LLE    TODAY'S TREATMENT:  Therapeutic Exercise - for HEP establishment, discussion on appropriate exercise/activity modification, PT education   Reviewed baseline home exercise and provided handout for [place] program (see Access Code), encouraged continued work on foot/ankle conditioning program given by MD; tactile cueing and therapist demonstration utilized as needed for carryover of proper technique to HEP.    Patient education on current condition, anatomy involved, prognosis, plan of care. Discussion on activity modification to prevent  flare-up of condition, including short bouts of walking at this time. Discussed walking in bouts of 5-8 minutes given limited duration of weightbearing tolerated and benefit of light aerobic exercise to improve sensitization associated with fibromyalgia.     PATIENT EDUCATION:  Education details: see above for pt education details Person educated: Patient Education method: Explanation, Demonstration, and Handouts Education comprehension: verbalized understanding and returned demonstration   HOME EXERCISE PROGRAM: Access Code [email or web address]   ASSESSMENT:  CLINICAL IMPRESSION: Patient is a 71 y.o. female who was seen today for physical therapy evaluation and treatment for R ankle pain and R knee pain. Focused on R ankle today given Hx of trauma and primary concern for R ankle pain and difficulty with weightbearing; injured 6.5 weeks ago when descending stairs and hearing pop with acute difficulty bearing weight on R foot. Referring diagnosis of peroneal tendon tear with possible lateral ankle ligament involvement. Hx of L ankle fusion. She reports improvement with plantar foot pain with recent exercises and reports intermittent R knee pain that has improved. Will complete further exam for R knee next visit. She has primary activity limitations in prolonged weightbearing/prolonged walking and standing. Associated impairments in: decreased dorsiflexion ROM and dec gastrocnemius mobility, decreased PF and inversion strength, moderate R ankle edema along region of lateral malleolus/surrounding soft tissues, and sensitivity along proximal peroneal tendon and ATFL/CFL. Pt prognosis is enhanced by pt motivation; it is diminished by age, comorbidities. Pt will benefit from skilled PT services to address the noted deficits and activity limitations to improve patient function and QoL.    OBJECTIVE IMPAIRMENTS Abnormal gait, decreased activity tolerance, decreased balance, decreased mobility, difficulty walking,  decreased ROM, decreased strength, increased edema, impaired flexibility, and pain.   ACTIVITY LIMITATIONS standing, squatting, stairs, transfers, and walking  PARTICIPATION LIMITATIONS: shopping and community activity  PERSONAL FACTORS Age, Past/current experiences, 3+ comorbidities: (Anxiety, Hx breast cancer, DM, GERD, HTN hyperlipidemia), and fibromyalgia diagonsis  are also affecting patient's functional outcome.   REHAB POTENTIAL: Good  CLINICAL DECISION MAKING: Evolving/moderate complexity  EVALUATION COMPLEXITY: Moderate   GOALS:   SHORT TERM GOALS: Target date: 11/26/2021  Patient will be independent and compliant with home exercises as needed to augment PT intervention and improve strength/mobility for improved pt function Baseline: 11/03/21: pt continuing exercises given by MD and addition of peroneal isotonics given with [employer] printout.   Goal status: INITIAL  2.  Patient will have R ankle dorsiflexion AROM 10-15 deg as needed for normalized gait pattern/anterior tibial translation during stance phase on R side Baseline: 11/03/21: R ankle DF AROM 7 deg Goal status: INITIAL   LONG TERM GOALS: Target date: 12/17/2021   Patient will demonstrate improved function as evidenced by a score of 51 on FOTO measure  for full participation in activities at home and in the community.  Baseline: 11/03/21: 33.    Goal status: INITIAL  2.  Patient will tolerate standing/ambulatory activity up to 30 minutes without increase in pain > 1-2/10 as needed for completing errands and social outings Baseline: 11/03/21: tolerates standing/weightbearing activity up to 10 mins Goal status: INITIAL  3.  Patient will improve NPRS at worst by 3 points or greater indicative of clinically meaningful change in reported NPRS as needed for participation in regular walking and life roles Baseline: 11/03/21: Pain 5-6/10 at worst Goal status: INITIAL  4.  Patient will improve strength of LE assessed  muscles by 1/2 MMT grade or greater for all motions in R lower limb (limited gains expected with L ankle strength given history of fusion) indicative of improved strength which allows for improved muscular endurance as needed for ability to complete prolonged weightbearing/ambulatory activity  Baseline: 11/03/21: MMT R ankle PF 4-, eversion 4+.   Goal status: INITIAL   PLAN: PT FREQUENCY: 1-2x/week with tapered visits after first 2 weeks  PT DURATION: 6 weeks  PLANNED INTERVENTIONS: Therapeutic exercises, Therapeutic activity, Neuromuscular re-education, Balance training, Gait training, Patient/Family education, Joint mobilization, Cryotherapy, Moist heat, and Manual therapy  PLAN FOR NEXT SESSION: Further assess hip and knee MMTs, knee ROM, special testing prn, and accessory motion testing. Continue with low-impact R ankle plantarflexor and peroneal strengthening, isometric ankle strengthening, manual therapy/STM and retrograde massage for R ankle to reduce pain and edema, progressive weightbearing activity as tolerated with patient gradually increasing walking duration outside of clinic as pain allows    Valentina Gu, PT, DPT #[id number]  Eilleen Kempf, PT 11/05/2021, 8:12 AM

## 2021-11-04 ENCOUNTER — Encounter: Payer: Self-pay | Admitting: Physical Therapy

## 2021-11-05 ENCOUNTER — Ambulatory Visit: Payer: Medicare HMO | Admitting: Physical Therapy

## 2021-11-05 ENCOUNTER — Other Ambulatory Visit: Payer: Self-pay | Admitting: Family Medicine

## 2021-11-05 DIAGNOSIS — M25571 Pain in right ankle and joints of right foot: Secondary | ICD-10-CM

## 2021-11-05 DIAGNOSIS — R262 Difficulty in walking, not elsewhere classified: Secondary | ICD-10-CM | POA: Diagnosis not present

## 2021-11-05 DIAGNOSIS — G8929 Other chronic pain: Secondary | ICD-10-CM

## 2021-11-05 DIAGNOSIS — M722 Plantar fascial fibromatosis: Secondary | ICD-10-CM | POA: Diagnosis not present

## 2021-11-05 DIAGNOSIS — M25561 Pain in right knee: Secondary | ICD-10-CM | POA: Diagnosis not present

## 2021-11-05 DIAGNOSIS — M1711 Unilateral primary osteoarthritis, right knee: Secondary | ICD-10-CM | POA: Diagnosis not present

## 2021-11-05 DIAGNOSIS — M6281 Muscle weakness (generalized): Secondary | ICD-10-CM

## 2021-11-05 MED ORDER — ACCU-CHEK GUIDE VI STRP: 1.0000 | ORAL_STRIP | Freq: Every day | 1 refills | Status: DC

## 2021-11-05 NOTE — Telephone Encounter (Signed)
Requested medication (s) are due for refill today - yes  Requested medication (s) are on the active medication list -yes  Future visit scheduled -yes  Last refill: 04/11/21   Notes to clinic: historical provider   Requested Prescriptions  Pending Prescriptions Disp Refills   ACCU-CHEK GUIDE test strip 100 each     Sig: 1 each by Other route daily.     Endocrinology: Diabetes - Testing Supplies Passed - 11/05/2021  1:06 PM      Passed - Valid encounter within last 12 months    Recent Outpatient Visits           2 weeks ago Peroneal tendinitis, right   [facility] Montel Culver, MD   3 weeks ago Peroneal tendinitis, right   [facility] Montel Culver, MD   1 month ago Peroneal tendinitis, right   [facility] Montel Culver, MD   1 month ago Controlled type 2 diabetes mellitus without complication, without long-term current use of insulin ([place])   [facility] Juline Patch, MD   2 months ago Osteoarthritis of right knee, unspecified osteoarthritis type   [facility] Montel Culver, MD       Future Appointments             In 3 weeks Zigmund Daniel Earley Abide, MD [facility], [facility]               Requested Prescriptions  Pending Prescriptions Disp Refills   ACCU-CHEK GUIDE test strip 100 each     Sig: 1 each by Other route daily.     Endocrinology: Diabetes - Testing Supplies Passed - 11/05/2021  1:06 PM      Passed - Valid encounter within last 12 months    Recent Outpatient Visits           2 weeks ago Peroneal tendinitis, right   [facility] Montel Culver, MD   3 weeks ago Peroneal tendinitis, right   [facility] Montel Culver, MD   1 month ago Peroneal tendinitis, right   [facility] Montel Culver, MD   1 month ago Controlled type 2 diabetes mellitus without complication, without long-term current use of insulin ([place])   [facility] Juline Patch, MD   2 months ago Osteoarthritis of right knee, unspecified osteoarthritis type   [facility] Montel Culver, MD       Future Appointments             In 3 weeks Zigmund Daniel, Earley Abide, MD [facility], [facility]

## 2021-11-05 NOTE — Addendum Note (Signed)
Addended by: Valentina Gu T on: 11/05/2021 08:24 AM   Modules accepted: Orders

## 2021-11-05 NOTE — Telephone Encounter (Signed)
Medication Refill - Medication: ACCU-CHEK GUIDE test strip  Has the patient contacted their pharmacy? No. New patient and provider never sent Rx  Preferred Pharmacy (with phone number or street name):  [id number] [place] [address] Phone:  [phone] Fax:  [phone]    Has the patient been seen for an appointment in the last year OR does the patient have an upcoming appointment? Yes.    Agent: Please be advised that RX refills may take up to 3 business days. We ask that you follow-up with your pharmacy.

## 2021-11-05 NOTE — Therapy (Signed)
OUTPATIENT PHYSICAL THERAPY TREATMENT NOTE   Patient Name: Sharon Kaufman MRN: [id number] DOB:[birth year], 71 y.o., female Today's Date: 11/08/2021   END OF SESSION:   PT End of Session - 11/08/21 2301     Visit Number 2    Number of Visits 13    Date for PT Re-Evaluation 12/15/21    Authorization Type [employer], VL based on auth    Progress Note Due on Visit 10    PT Start Time 1416    PT Stop Time 1458    PT Time Calculation (min) 42 min    Activity Tolerance Patient tolerated treatment well;No increased pain    Behavior During Therapy WFL for tasks assessed/performed             Past Medical History:  Diagnosis Date   Allergy    Anxiety    Breast cancer, left ([place]) 2008   Diabetes mellitus without complication ([place])    GERD (gastroesophageal reflux disease)    Hyperlipidemia    Hypertension    Past Surgical History:  Procedure Laterality Date   ANKLE FUSION Left 2009   APPENDECTOMY  2009   BREAST LUMPECTOMY Left 2008   ROTATOR CUFF REPAIR Left 2011   SPLENECTOMY  1973   TOTAL SHOULDER REPLACEMENT Right 2019   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   Statin intolerance 03/21/2021   Acquired trigger finger of right middle finger 03/10/2021   Ankylosis of ankle joint, left 03/10/2021   Cirrhosis ([place]) 07/25/2020   Bronchiectasis without complication ([place]) 86/75/4492   Deviated septum 03/22/2020   Allergic rhinitis due to pollen 03/22/2020   Overweight (BMI 25.0-29.9) 12/06/2019   Hypertrophy of inter-atrial septum 10/02/2019   History of cardiovascular stress test 01/00/7121   Diastolic dysfunction without heart  failure 10/02/2019   Dyspareunia in female 08/02/2019   Cataract, nuclear sclerotic senile, right 07/24/2019   Fibromyalgia 07/11/2019   History of hepatitis C 07/11/2019   Rheumatoid factor positive 06/06/2019   Other insomnia 06/06/2019   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   Essential hypertension 06/06/2019   Diabetes type 2, controlled ([place]) 06/06/2019   Anxiety 06/06/2019   Age-related osteoporosis without current pathological fracture 01/03/2013   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/2012    PCP: Juline Patch, MD   REFERRING PROVIDER: Montel Culver, MD   REFERRING DIAG:  [id number] (ICD-10-CM) - Chronic pain of right knee  M17.11 (ICD-10-CM) - Osteoarthritis of right knee, unspecified osteoarthritis type  M72.2 (ICD-10-CM) - Plantar fasciitis, right      THERAPY DIAG:  Pain in right ankle and joints of right foot   Chronic pain of right knee   Difficulty in walking, not elsewhere classified   Muscle weakness (generalized)   Rationale for Evaluation and Treatment Rehabilitation   ONSET DATE: 09/17/21    PERTINENT HISTORY: Patient is a 71 year old female referred for chronic R knee pain and R foot/ankle pain. Patient reports that she's had chronic issues with R plantar fasciitis. She was going to come to PT for these issues, but she had pop and pain with stepping down her garage steps  6.5 weeks ago. Dr. Zigmund Daniel discussed with patient grade 2 peroneal tear. Patient feels that previous injection treatments did not help. Pt is continuing diclofenac and amitriptyline per referring provider. Patient had recent new diagnosis of fibromyalgia. Hx of L ankle fusion. Patient reports pain along R lateral ankle, she had notable ecchymosis along R lateral ankle/lateral malleolus. Patient reports previously having pain along arch of her R foot that has improved with exercises given by Dr. Zigmund Daniel (ankle/foot  conditioning program). Patient denies paresthesias or numbness. No fracture or acute osseous abnormality noted in foot or ankle X-rays. Pt reports that ankle brace bothers her lateral ankle and she does not tolerate wearing it presently. Pt is wearing heel cup inserts and feels they help. 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 Patient tolerates standing up to 10 minutes presently. Pt denies disturbed sleep presently due to ankle pain.        OBJECTIVE (all objective measures performed at initial evaluation unless otherwise stated):    DIAGNOSTIC FINDINGS: X-ray of R foot   07/29/21 IMPRESSION: No recent fracture or dislocation is seen in the right foot. Hallux valgus deformity is noted. Possible minimal calcifications are seen in the plantar fascia suggesting possible chronic inflammation.   PATIENT SURVEYS:  FOTO 33 , predicated goal score of 51   COGNITION:           Overall cognitive status: Within functional limits for tasks assessed                          SENSATION: Not tested   EDEMA:  Moderate pitting along R lateral malleolus. Minimal increase in girth per gross visual inspection.    MUSCLE LENGTH: Gastrocnemius: R Poor, L Poor      (additional assessment for R knee ROM and hip/knee MMTs completed 11/05/21) PALPATION: Tenderness to palpation along R lateral malleolus, R ATFL, R ACL, R peroneal tendon posterior to lateral malleolus. L knee medial/lateral joint line, fibular head, MCL. R knee fibular head     LOWER EXTREMITY ROM:   Active ROM Right eval Left (fused ankle) eval  Hip flexion     Hip extension      Hip abduction    Hip adduction     Hip internal rotation    Hip external rotation    Knee flexion  130 134  Knee extension 4+   4+  Ankle dorsiflexion 7 1  Ankle plantarflexion WNL 15  Ankle inversion 42 22  Ankle eversion 20 18   (Blank rows = not tested)   Great toe PROM R 85, L 85   LOWER EXTREMITY MMT:   MMT Right eval  Left eval  Hip flexion  4- 4  Hip extension      Hip abduction 4 4  Hip adduction  5 5   Hip internal rotation  4+ 4+  Hip external rotation  4  4  Knee flexion 5  5  Knee extension 4-  4-  Ankle dorsiflexion 5 4+  Ankle plantarflexion 4- 4-  Ankle inversion 5 4  Ankle eversion 4+ 5   (Blank rows = not tested)   LOWER EXTREMITY SPECIAL TESTS:  Ankle special tests: Anterior drawer test: positive  and Talar tilt test: negative, Dorsiflexion-eversion: negative. Inversion stress test: negative. Eversion stress test: negative.    FUNCTIONAL TESTS:  FOTO 33   GAIT: Comments: Mild decreased stance time RLE, early heel rise LLE  TODAY'S TREATMENT:    Subjective: Patient reports 4-5/10 pain in R ankle at arrival to PT. Patient reports no notable R knee pain arrival. Pt reports she took her dogs for a walk on Tuesday, 20-minute walk. She reports having to ice her foot afterward.   Therapeutic Exercise - for improved soft tissue flexibility and extensibility as needed for ROM, improved strength as needed to improve performance of CKC activities/functional movements  *Additional assessment completed for R knee ROM, hip and knee MMTs   Dorsiflexion/plantarflexion intrinsics; x20 alternating   Long-sitting gastrocemius stretch; 2x30sec SLR; x10, with moderate verbal cueing and demo for correct technique, additional verbal cueing for maintaining straight leg with each repetition   S/L Hip abducftion; x10; verbal cueing and demonstration for correct angle (neutral hip versus hip flexed) during exercise performance     Pt edu: HEP update and review   Manual Therapy - for symptom modulation, soft tissue sensitivity and mobility, joint mobility, ROM   Retrograde massage for edema reduction; x 5 minutes STM/DTM R flexor digitorum and flexor hallucis brevis, quadratus plantae; x 5 minutes            PATIENT EDUCATION:  Education details: see above for pt education  details Person educated: Patient Education method: Consulting civil engineer, Demonstration, and Handouts Education comprehension: verbalized understanding and returned demonstration     HOME EXERCISE PROGRAM: Access Code [email or web address]     ASSESSMENT:   CLINICAL IMPRESSION: Patient has ongoing sensitivity affecting lateral ankle complex today with most pain anterior and inferior to lateral malleolus with palpation. Assessed further for impairments related to her chronic R knee pain and updated her HEP to address R knee. Patient responds well with manual therapy and is able to initiate open-chain mobility work for her R foot without notable pain. She has progressed well with her home exercise to date and should continue to improve with PT intervention. Pt has remaining impairments in: decreased dorsiflexion ROM and dec gastrocnemius mobility, decreased PF and inversion strength, decreased quadriceps and hip strength, moderate R ankle edema along region of lateral malleolus/surrounding soft tissues, and sensitivity along proximal peroneal tendon and ATFL/CFL. Pt will benefit from continued skilled PT services to address the noted deficits and activity limitations to improve patient function and QoL.       OBJECTIVE IMPAIRMENTS Abnormal gait, decreased activity tolerance, decreased balance, decreased mobility, difficulty walking, decreased ROM, decreased strength, increased edema, impaired flexibility, and pain.    ACTIVITY LIMITATIONS standing, squatting, stairs, transfers, and walking   PARTICIPATION LIMITATIONS: shopping and community activity   PERSONAL FACTORS Age, Past/current experiences, 3+ comorbidities: (Anxiety, Hx breast cancer, DM, GERD, HTN hyperlipidemia), and fibromyalgia diagonsis  are also affecting patient's functional outcome.    REHAB POTENTIAL: Good   CLINICAL DECISION MAKING: Evolving/moderate complexity   EVALUATION COMPLEXITY: Moderate     GOALS:     SHORT TERM GOALS: Target  date: 11/26/2021  Patient will be independent and compliant with home exercises as needed to augment PT intervention and improve strength/mobility for improved pt function Baseline: 11/03/21: pt continuing exercises given by MD and addition of peroneal isotonics given with [employer] printout.   Goal status: INITIAL   2.  Patient will have R ankle dorsiflexion AROM 10-15 deg as needed for normalized gait pattern/anterior tibial translation during stance phase on R side Baseline: 11/03/21: R ankle DF AROM 7 deg Goal status: INITIAL     LONG TERM GOALS: Target date: 12/17/2021    Patient will demonstrate improved function as evidenced by  a score of 51 on FOTO measure for full participation in activities at home and in the community.   Baseline: 11/03/21: 33.    Goal status: INITIAL   2.  Patient will tolerate standing/ambulatory activity up to 30 minutes without increase in pain > 1-2/10 as needed for completing errands and social outings Baseline: 11/03/21: tolerates standing/weightbearing activity up to 10 mins Goal status: INITIAL   3.  Patient will improve NPRS at worst by 3 points or greater indicative of clinically meaningful change in reported NPRS as needed for participation in regular walking and life roles Baseline: 11/03/21: Pain 5-6/10 at worst Goal status: INITIAL   4.  Patient will improve strength of LE assessed muscles by 1/2 MMT grade or greater for all motions in R lower limb (limited gains expected with L ankle strength given history of fusion) indicative of improved strength which allows for improved muscular endurance as needed for ability to complete prolonged weightbearing/ambulatory activity  Baseline: 11/03/21: MMT R ankle PF 4-, eversion 4+.   Goal status: INITIAL     PLAN: PT FREQUENCY: 1-2x/week with tapered visits after first 2 weeks   PT DURATION: 6 weeks   PLANNED INTERVENTIONS: Therapeutic exercises, Therapeutic activity, Neuromuscular re-education, Balance  training, Gait training, Patient/Family education, Joint mobilization, Cryotherapy, Moist heat, and Manual therapy   PLAN FOR NEXT SESSION: Continue with low-impact R ankle plantarflexor and peroneal strengthening, isometric ankle strengthening, manual therapy/STM and retrograde massage for R ankle to reduce pain and edema, continued with quad and hip strengthening; progressive weightbearing activity as tolerated with patient gradually increasing walking duration outside of clinic as pain allows     Valentina Gu, PT, DPT #[id number]  Eilleen Kempf, PT 11/08/2021, 11:01 PM

## 2021-11-08 ENCOUNTER — Encounter: Payer: Self-pay | Admitting: Physical Therapy

## 2021-11-10 ENCOUNTER — Ambulatory Visit: Payer: Medicare HMO | Admitting: Physical Therapy

## 2021-11-12 ENCOUNTER — Ambulatory Visit: Payer: Medicare HMO | Admitting: Physical Therapy

## 2021-11-12 DIAGNOSIS — M6281 Muscle weakness (generalized): Secondary | ICD-10-CM | POA: Diagnosis not present

## 2021-11-12 DIAGNOSIS — M25571 Pain in right ankle and joints of right foot: Secondary | ICD-10-CM | POA: Diagnosis not present

## 2021-11-12 DIAGNOSIS — M722 Plantar fascial fibromatosis: Secondary | ICD-10-CM | POA: Diagnosis not present

## 2021-11-12 DIAGNOSIS — G8929 Other chronic pain: Secondary | ICD-10-CM

## 2021-11-12 DIAGNOSIS — R262 Difficulty in walking, not elsewhere classified: Secondary | ICD-10-CM

## 2021-11-12 DIAGNOSIS — M1711 Unilateral primary osteoarthritis, right knee: Secondary | ICD-10-CM | POA: Diagnosis not present

## 2021-11-12 DIAGNOSIS — M25561 Pain in right knee: Secondary | ICD-10-CM | POA: Diagnosis not present

## 2021-11-12 NOTE — Therapy (Incomplete)
OUTPATIENT PHYSICAL THERAPY TREATMENT NOTE   Patient Name: Sharon Kaufman MRN: [id number] DOB:05/13/1951, 71 y.o., female Today's Date: 11/12/2021  PCP: Juline Patch, MD REFERRING PROVIDER: Juline Patch, MD  END OF SESSION:    Past Medical History:  Diagnosis Date   Allergy    Anxiety    Breast cancer, left ([place]) 2008   Diabetes mellitus without complication ([place])    GERD (gastroesophageal reflux disease)    Hyperlipidemia    Hypertension    Past Surgical History:  Procedure Laterality Date   ANKLE FUSION Left 2009   APPENDECTOMY  2009   BREAST LUMPECTOMY Left 2008   ROTATOR CUFF REPAIR Left 2011   [place]   TOTAL SHOULDER REPLACEMENT Right 2019   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   Statin intolerance 03/21/2021   Acquired trigger finger of right middle finger 03/10/2021   Ankylosis of ankle joint, left 03/10/2021   Cirrhosis ([place]) 07/25/2020   Bronchiectasis without complication ([place]) 48/54/6270   Deviated septum 03/22/2020   Allergic rhinitis due to pollen 03/22/2020   Overweight (BMI 25.0-29.9) 12/06/2019   Hypertrophy of inter-atrial septum 10/02/2019   History of cardiovascular stress test 35/00/9381   Diastolic dysfunction without heart failure 10/02/2019   Dyspareunia in female 08/02/2019   Cataract, nuclear sclerotic senile, right 07/24/2019   Fibromyalgia 07/11/2019   History of hepatitis C 07/11/2019   Rheumatoid factor positive 06/06/2019   Other insomnia 06/06/2019   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   Essential  hypertension 06/06/2019   Diabetes type 2, controlled ([place]) 06/06/2019   Anxiety 06/06/2019   Age-related osteoporosis without current pathological fracture 01/03/2013   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/2012       PCP: Juline Patch, MD   REFERRING PROVIDER: Montel Culver, MD   REFERRING DIAG:  [phone] (ICD-10-CM) - Chronic pain of right knee  M17.11 (ICD-10-CM) - Osteoarthritis of right knee, unspecified osteoarthritis type  M72.2 (ICD-10-CM) - Plantar fasciitis, right      THERAPY DIAG:  Pain in right ankle and joints of right foot   Chronic pain of right knee   Difficulty in walking, not elsewhere classified   Muscle weakness (generalized)   Rationale for Evaluation and Treatment Rehabilitation   ONSET DATE: 09/17/21     PERTINENT HISTORY: Patient is a 71 year old female referred for chronic R knee pain and R foot/ankle pain. Patient reports that she's had chronic issues with R plantar fasciitis. She was going to come to PT for these issues, but she had pop and pain with stepping down her garage steps 6.5 weeks ago. Dr. Zigmund Daniel discussed with patient grade 2 peroneal tear. Patient feels that previous injection treatments did not help. Pt is continuing diclofenac and amitriptyline per referring provider. Patient had recent new diagnosis of fibromyalgia. Hx of L ankle fusion. Patient reports pain along R lateral ankle, she had notable ecchymosis along R lateral ankle/lateral malleolus. Patient reports previously having pain along arch of her R foot that has improved with exercises given by Dr. Zigmund Daniel (ankle/foot conditioning program). Patient denies paresthesias or numbness. No fracture  or acute osseous abnormality noted in foot or ankle X-rays. Pt reports that ankle brace bothers her lateral ankle and she does not tolerate wearing it presently. Pt is wearing heel cup inserts and feels they help. 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 Patient  tolerates standing up to 10 minutes presently. Pt denies disturbed sleep presently due to ankle pain.        OBJECTIVE (all objective measures performed at initial evaluation unless otherwise stated):    DIAGNOSTIC FINDINGS: X-ray of R foot   07/29/21 IMPRESSION: No recent fracture or dislocation is seen in the right foot. Hallux valgus deformity is noted. Possible minimal calcifications are seen in the plantar fascia suggesting possible chronic inflammation.   PATIENT SURVEYS:  FOTO 33 , predicated goal score of 51   COGNITION:           Overall cognitive status: Within functional limits for tasks assessed                          SENSATION: Not tested   EDEMA:  Moderate pitting along R lateral malleolus. Minimal increase in girth per gross visual inspection.    MUSCLE LENGTH: Gastrocnemius: R Poor, L Poor      (additional assessment for R knee ROM and hip/knee MMTs completed 11/05/21) PALPATION: Tenderness to palpation along R lateral malleolus, R ATFL, R ACL, R peroneal tendon posterior to lateral malleolus. L knee medial/lateral joint line, fibular head, MCL. R knee fibular head      LOWER EXTREMITY ROM:   Active ROM Right eval Left (fused ankle) eval  Hip flexion      Hip extension      Hip abduction      Hip adduction      Hip internal rotation      Hip external rotation      Knee flexion  130 134  Knee extension 4+   4+  Ankle dorsiflexion 7 1  Ankle plantarflexion WNL 15  Ankle inversion 42 22  Ankle eversion 20 18   (Blank rows = not tested)   Great toe PROM R 85, L 85   LOWER EXTREMITY MMT:   MMT Right eval Left eval  Hip flexion  4- 4  Hip extension      Hip abduction 4 4  Hip adduction  5 5   Hip internal rotation  4+ 4+  Hip external rotation  4  4  Knee flexion 5  5  Knee extension 4-  4-  Ankle dorsiflexion 5 4+  Ankle plantarflexion 4- 4-  Ankle inversion 5 4  Ankle eversion 4+ 5   (Blank rows = not tested)   LOWER EXTREMITY  SPECIAL TESTS:  Ankle special tests: Anterior drawer test: positive  and Talar tilt test: negative, Dorsiflexion-eversion: negative. Inversion stress test: negative. Eversion stress test: negative.    FUNCTIONAL TESTS:  FOTO 33   GAIT: Comments: Mild decreased stance time RLE, early heel rise LLE                                   TODAY'S TREATMENT:     Subjective: Patient reports 4-5/10 pain in R ankle at arrival to PT. Patient reports no notable R knee pain arrival. Pt reports she took her dogs for a walk on Tuesday, 20-minute walk. She reports having to ice her foot afterward.  Therapeutic Exercise - for improved soft tissue flexibility and extensibility as needed for ROM, improved strength as needed to improve performance of CKC activities/functional movements                           Dorsiflexion/plantarflexion intrinsics; x20 alternating                         Long-sitting gastrocemius stretch; 2x30sec SLR; x10, with moderate verbal cueing and demo for correct technique, additional verbal cueing for maintaining straight leg with each repetition                         S/L Hip abducftion; x10; verbal cueing and demonstration for correct angle (neutral hip versus hip flexed) during exercise performance   Ankle eversion with Tband, Blue Tband; x10   Standing Tandem stance;    Standing heel raise;                              Pt edu: HEP update and review     Manual Therapy - for symptom modulation, soft tissue sensitivity and mobility, joint mobility, ROM    Retrograde massage for edema reduction; x 5 minutes STM/DTM R flexor digitorum and flexor hallucis brevis, quadratus plantae; x 5 minutes                 PATIENT EDUCATION:  Education details: see above for pt education details Person educated: Patient Education method: Consulting civil engineer, Demonstration, and Handouts Education comprehension: verbalized understanding and returned demonstration     HOME EXERCISE  PROGRAM: Access Code [email or web address]     ASSESSMENT:   CLINICAL IMPRESSION: Patient has ongoing sensitivity affecting lateral ankle complex today with most pain anterior and inferior to lateral malleolus with palpation. Assessed further for impairments related to her chronic R knee pain and updated her HEP to address R knee. Patient responds well with manual therapy and is able to initiate open-chain mobility work for her R foot without notable pain. She has progressed well with her home exercise to date and should continue to improve with PT intervention. Pt has remaining impairments in: decreased dorsiflexion ROM and dec gastrocnemius mobility, decreased PF and inversion strength, decreased quadriceps and hip strength, moderate R ankle edema along region of lateral malleolus/surrounding soft tissues, and sensitivity along proximal peroneal tendon and ATFL/CFL. Pt will benefit from continued skilled PT services to address the noted deficits and activity limitations to improve patient function and QoL.       OBJECTIVE IMPAIRMENTS Abnormal gait, decreased activity tolerance, decreased balance, decreased mobility, difficulty walking, decreased ROM, decreased strength, increased edema, impaired flexibility, and pain.    ACTIVITY LIMITATIONS standing, squatting, stairs, transfers, and walking   PARTICIPATION LIMITATIONS: shopping and community activity   PERSONAL FACTORS Age, Past/current experiences, 3+ comorbidities: (Anxiety, Hx breast cancer, DM, GERD, HTN hyperlipidemia), and fibromyalgia diagonsis  are also affecting patient's functional outcome.    REHAB POTENTIAL: Good   CLINICAL DECISION MAKING: Evolving/moderate complexity   EVALUATION COMPLEXITY: Moderate     GOALS:     SHORT TERM GOALS: Target date: 11/26/2021  Patient will be independent and compliant with home exercises as needed to augment PT intervention and improve strength/mobility for improved pt function Baseline: 11/03/21: pt  continuing exercises given by MD and addition of peroneal isotonics given with [employer] printout.   Goal  status: INITIAL   2.  Patient will have R ankle dorsiflexion AROM 10-15 deg as needed for normalized gait pattern/anterior tibial translation during stance phase on R side Baseline: 11/03/21: R ankle DF AROM 7 deg Goal status: INITIAL     LONG TERM GOALS: Target date: 12/17/2021    Patient will demonstrate improved function as evidenced by a score of 51 on FOTO measure for full participation in activities at home and in the community.   Baseline: 11/03/21: 33.    Goal status: INITIAL   2.  Patient will tolerate standing/ambulatory activity up to 30 minutes without increase in pain > 1-2/10 as needed for completing errands and social outings Baseline: 11/03/21: tolerates standing/weightbearing activity up to 10 mins Goal status: INITIAL   3.  Patient will improve NPRS at worst by 3 points or greater indicative of clinically meaningful change in reported NPRS as needed for participation in regular walking and life roles Baseline: 11/03/21: Pain 5-6/10 at worst Goal status: INITIAL   4.  Patient will improve strength of LE assessed muscles by 1/2 MMT grade or greater for all motions in R lower limb (limited gains expected with L ankle strength given history of fusion) indicative of improved strength which allows for improved muscular endurance as needed for ability to complete prolonged weightbearing/ambulatory activity  Baseline: 11/03/21: MMT R ankle PF 4-, eversion 4+.   Goal status: INITIAL     PLAN: PT FREQUENCY: 1-2x/week with tapered visits after first 2 weeks   PT DURATION: 6 weeks   PLANNED INTERVENTIONS: Therapeutic exercises, Therapeutic activity, Neuromuscular re-education, Balance training, Gait training, Patient/Family education, Joint mobilization, Cryotherapy, Moist heat, and Manual therapy   PLAN FOR NEXT SESSION: Continue with low-impact R ankle plantarflexor and  peroneal strengthening, isometric ankle strengthening, manual therapy/STM and retrograde massage for R ankle to reduce pain and edema, continued with quad and hip strengthening; progressive weightbearing activity as tolerated with patient gradually increasing walking duration outside of clinic as pain allows        Eilleen Kempf, PT 11/12/2021, 2:15 PM

## 2021-11-13 ENCOUNTER — Other Ambulatory Visit: Payer: Medicare HMO

## 2021-11-15 ENCOUNTER — Encounter: Payer: Self-pay | Admitting: Physical Therapy

## 2021-11-17 ENCOUNTER — Encounter: Payer: Medicare HMO | Admitting: Physical Therapy

## 2021-11-17 ENCOUNTER — Telehealth: Payer: Self-pay | Admitting: Family Medicine

## 2021-11-19 ENCOUNTER — Encounter: Payer: Medicare HMO | Admitting: Physical Therapy

## 2021-11-24 ENCOUNTER — Encounter: Payer: Medicare HMO | Admitting: Physical Therapy

## 2021-11-26 ENCOUNTER — Encounter: Payer: Medicare HMO | Admitting: Physical Therapy

## 2021-11-27 ENCOUNTER — Other Ambulatory Visit: Payer: Self-pay | Admitting: Family Medicine

## 2021-11-27 DIAGNOSIS — E7800 Pure hypercholesterolemia, unspecified: Secondary | ICD-10-CM

## 2021-11-28 NOTE — Telephone Encounter (Signed)
Requested medications are due for refill today.  yes  Requested medications are on the active medications list.  yes  Last refill. 10/13/2021 #30 1 refill  Future visit scheduled.   yes  Notes to clinic.  Failed protocol  - missing labs    Requested Prescriptions  Pending Prescriptions Disp Refills   ezetimibe (ZETIA) 10 MG tablet [Pharmacy Med Name: EZETIMIBE 10 MG TABLET] 30 tablet 1    Sig: TAKE 1 TABLET BY MOUTH EVERY DAY     Cardiovascular:  Antilipid - Sterol Transport Inhibitors Failed - 11/27/2021  4:42 PM      Failed - AST in normal range and within 360 days    No results found for: "POCAST", "AST"       Failed - ALT in normal range and within 360 days    No results found for: "ALT", "LABALT", "POCALT"       Failed - Lipid Panel in normal range within the last 12 months    Cholesterol, Total  Date Value Ref Range Status  10/09/2021 239 (H) 100 - 199 mg/dL Final   LDL Chol Calc (NIH)  Date Value Ref Range Status  10/09/2021 149 (H) 0 - 99 mg/dL Final   HDL  Date Value Ref Range Status  10/09/2021 69 >39 mg/dL Final   Triglycerides  Date Value Ref Range Status  10/09/2021 118 0 - 149 mg/dL Final         Passed - Patient is not pregnant      Passed - Valid encounter within last 12 months    Recent Outpatient Visits           1 month ago Peroneal tendinitis, right   [facility] Montel Culver, MD   1 month ago Peroneal tendinitis, right   [facility] Montel Culver, MD   1 month ago Peroneal tendinitis, right   [facility] Montel Culver, MD   1 month ago Controlled type 2 diabetes mellitus without complication, without long-term current use of insulin ([place])   [facility] Juline Patch, MD   3 months ago Osteoarthritis of right knee, unspecified osteoarthritis type   [facility] Montel Culver, MD       Future Appointments             In 4 days Zigmund Daniel, Earley Abide, MD [facility], [place]

## 2021-12-02 ENCOUNTER — Ambulatory Visit (INDEPENDENT_AMBULATORY_CARE_PROVIDER_SITE_OTHER): Payer: Medicare HMO | Admitting: Family Medicine

## 2021-12-02 ENCOUNTER — Inpatient Hospital Stay (INDEPENDENT_AMBULATORY_CARE_PROVIDER_SITE_OTHER): Payer: Medicare HMO | Admitting: Radiology

## 2021-12-02 ENCOUNTER — Other Ambulatory Visit: Payer: Self-pay

## 2021-12-02 ENCOUNTER — Encounter: Payer: Self-pay | Admitting: Family Medicine

## 2021-12-02 VITALS — BP 122/76 | HR 88 | Ht 63.5 in | Wt 172.8 lb

## 2021-12-02 DIAGNOSIS — G8929 Other chronic pain: Secondary | ICD-10-CM | POA: Diagnosis not present

## 2021-12-02 DIAGNOSIS — M7918 Myalgia, other site: Secondary | ICD-10-CM | POA: Diagnosis not present

## 2021-12-02 DIAGNOSIS — M1711 Unilateral primary osteoarthritis, right knee: Secondary | ICD-10-CM

## 2021-12-02 DIAGNOSIS — M7671 Peroneal tendinitis, right leg: Secondary | ICD-10-CM | POA: Diagnosis not present

## 2021-12-02 MED ORDER — FLUOXETINE HCL 20 MG PO CAPS: 20.0000 mg | ORAL_CAPSULE | Freq: Every day | ORAL | 0 refills | Status: DC

## 2021-12-02 MED ORDER — TRIAMCINOLONE ACETONIDE 40 MG/ML IJ SUSP
40.0000 mg | Freq: Once | INTRAMUSCULAR | Status: AC
  Administered 2021-12-02: 40 mg via INTRAMUSCULAR

## 2021-12-02 NOTE — Assessment & Plan Note (Signed)
Patient has noted gradual recurrence of right knee pain in the setting of known osteoarthritis, of note she did receive intra-articular corticosteroid injection under ultrasound guidance on 08/12/2021.  Examination of her right knee reveals full painless range of motion, tenderness at the lateral greater than medial joint line, secondarily to the medial patellar facet, no laxity with anterior/posterior and varus/valgus stressing.  McMurray benign.  Given her comorbid fibromyalgia which has improved following medication regimen, time since last injection, she elected to repeat ultrasound-guided right knee intra-articular cortisone injection.  I have advised her to provide a status update in 2 weeks regarding knee symptoms, after which we will proceed with discontinuation of fluoxetine.  She can utilize diclofenac over the interim.

## 2021-12-02 NOTE — Progress Notes (Signed)
Primary Care / Sports Medicine Office Visit  Patient Information:  Patient ID: Sharon Kaufman, female DOB: 05-06-1951 Age: 71 y.o. MRN: [id number]   Sharon Kaufman is a pleasant 71 y.o. female presenting with the following:  Chief Complaint  Patient presents with   Peroneal tendinitis, right    Feeling much better, knee still bothers her often. Wants to stop the Prozac    Vitals:   12/02/21 1340  BP: 122/76  Pulse: 88  SpO2: 98%   Vitals:   12/02/21 1340  Weight: 172 lb 12.8 oz (78.4 kg)  Height: 5' 3.5" (1.613 m)   Body mass index is 30.13 kg/m.  No results found.   Independent interpretation of notes and tests performed by another provider:   None  Procedures performed:   Procedure:  Injection of right intra-articular knee under ultrasound guidance. Ultrasound guidance utilized for out of plane anteromedial approach to the right knee, medial tibiofemoral joint space visualized without effusion Samsung HS60 device utilized with permanent recording / reporting. Verbal informed consent obtained and verified. Skin prepped in a sterile fashion. Ethyl chloride for topical local analgesia.  Completed without difficulty and tolerated well. Medication: triamcinolone acetonide 40 mg/mL suspension for injection 1 mL total and 2 mL lidocaine 1% without epinephrine utilized for needle placement anesthetic Advised to contact for fevers/chills, erythema, induration, drainage, or persistent bleeding.   Pertinent History, Exam, Impression, and Recommendations:   Problem List Items Addressed This Visit       Musculoskeletal and Integument   Osteoarthritis of right knee - Primary    Patient has noted gradual recurrence of right knee pain in the setting of known osteoarthritis, of note she did receive intra-articular corticosteroid injection under ultrasound guidance on 08/12/2021.  Examination of her right knee reveals full painless range of motion, tenderness at the  lateral greater than medial joint line, secondarily to the medial patellar facet, no laxity with anterior/posterior and varus/valgus stressing.  McMurray benign.  Given her comorbid fibromyalgia which has improved following medication regimen, time since last injection, she elected to repeat ultrasound-guided right knee intra-articular cortisone injection.  I have advised her to provide a status update in 2 weeks regarding knee symptoms, after which we will proceed with discontinuation of fluoxetine.  She can utilize diclofenac over the interim.      Relevant Medications   diclofenac (VOLTAREN) 75 MG EC tablet   Other Relevant Orders   [place] LIMITED JOINT SPACE STRUCTURES LOW RIGHT   Peroneal tendinitis, right    Patient presents for follow-up to right foot/ankle pain, since last visit she has had a chance to see rheumatology for the abnormal panel labs that were noted as part of work-up, comorbid treatment for fibromyalgia with off label usage of fluoxetine and amitriptyline.  She states that the rheumatology group states that symptoms are most connected to fibromyalgia and titration of amitriptyline to 20 mg was performed.  She has continued with fluoxetine but is interested in discontinuation of this if possible, no adverse effects reported.  Examination reveals subtle tenderness at the insertion of the peroneal brevis, provocative testing otherwise benign.  Given her excellent interval clinical progress, I have advised her to continue with home exercises, diclofenac as needed, and medications for fibromyalgia.  She can follow-up on as-needed basis for this issue.        Other   Chronic musculoskeletal pain    Patient with rheumatologic lab abnormalities, has since seen rheumatology group and primary concern is  for symptoms stemming from fibromyalgia as opposed to rheumatologic process otherwise.  Amitriptyline has been titrated to 20 mg, continues off label usage of fluoxetine 20 mg.  Her goal is to  be on as little of medications as necessary, in that regard plan to discontinue fluoxetine in 2 weeks, we can then reassess her fibromyalgia symptoms.  If interval worsened, can revisit a restart of fluoxetine, if control maintained, can remain off of this medication.  Patient is understanding and amenable to this plan.      Relevant Medications   diclofenac (VOLTAREN) 75 MG EC tablet   FLUoxetine (PROZAC) 20 MG capsule     Orders & Medications Meds ordered this encounter  Medications   FLUoxetine (PROZAC) 20 MG capsule    Sig: Take 1 capsule (20 mg total) by mouth daily.    Dispense:  30 capsule    Refill:  0   Orders Placed This Encounter  Procedures   [place] LIMITED JOINT SPACE STRUCTURES LOW RIGHT     Return in about 10 weeks (around 02/10/2022).     Montel Culver, MD   Primary Care Sports Medicine [place]

## 2021-12-02 NOTE — Assessment & Plan Note (Signed)
Patient presents for follow-up to right foot/ankle pain, since last visit she has had a chance to see rheumatology for the abnormal panel labs that were noted as part of work-up, comorbid treatment for fibromyalgia with off label usage of fluoxetine and amitriptyline.  She states that the rheumatology group states that symptoms are most connected to fibromyalgia and titration of amitriptyline to 20 mg was performed.  She has continued with fluoxetine but is interested in discontinuation of this if possible, no adverse effects reported.  Examination reveals subtle tenderness at the insertion of the peroneal brevis, provocative testing otherwise benign.  Given her excellent interval clinical progress, I have advised her to continue with home exercises, diclofenac as needed, and medications for fibromyalgia.  She can follow-up on as-needed basis for this issue.

## 2021-12-02 NOTE — Patient Instructions (Addendum)
You have just been given a cortisone injection to reduce pain and inflammation. After the injection you may notice immediate relief of pain as a result of the Lidocaine. It is important to rest the area of the injection for 24 to 48 hours after the injection. There is a possibility of some temporary increased discomfort and swelling for up to 72 hours until the cortisone begins to work. If you do have pain, simply rest the joint and use ice.  - Continue fluoxetine daily x2 weeks - Can use diclofenac up to twice daily for breakthrough pain if necessary - Provide a status update via MyChart/phone call at the 2-week mark regarding knee symptoms - Follow-up after seeing rheumatology

## 2021-12-02 NOTE — Assessment & Plan Note (Signed)
Patient with rheumatologic lab abnormalities, has since seen rheumatology group and primary concern is for symptoms stemming from fibromyalgia as opposed to rheumatologic process otherwise.  Amitriptyline has been titrated to 20 mg, continues off label usage of fluoxetine 20 mg.  Her goal is to be on as little of medications as necessary, in that regard plan to discontinue fluoxetine in 2 weeks, we can then reassess her fibromyalgia symptoms.  If interval worsened, can revisit a restart of fluoxetine, if control maintained, can remain off of this medication.  Patient is understanding and amenable to this plan.

## 2021-12-02 NOTE — Addendum Note (Signed)
Addended by: Delia Heady on: 12/02/2021 04:19 PM   Modules accepted: Orders

## 2021-12-03 LAB — CALCIUM: Calcium: 9.9 mg/dL (ref 8.7–10.3)

## 2021-12-03 LAB — PHOSPHORUS: Phosphorus: 2.7 mg/dL — ABNORMAL LOW (ref 3.0–4.3)

## 2021-12-10 ENCOUNTER — Other Ambulatory Visit: Payer: Self-pay | Admitting: Family Medicine

## 2021-12-10 DIAGNOSIS — E119 Type 2 diabetes mellitus without complications: Secondary | ICD-10-CM

## 2021-12-11 NOTE — Telephone Encounter (Signed)
Requested Prescriptions  Pending Prescriptions Disp Refills  . glipiZIDE (GLUCOTROL XL) 2.5 MG 24 hr tablet [Pharmacy Med Name: GLIPIZIDE ER 2.5 MG Tablet Extended Release 24 Hour] 90 tablet 1    Sig: TAKE 1 TABLET (2.5 MG TOTAL) BY MOUTH DAILY WITH BREAKFAST.     Endocrinology:  Diabetes - Sulfonylureas Failed - 12/10/2021 10:05 AM      Failed - Cr in normal range and within 360 days    Creatinine, Ser  Date Value Ref Range Status  10/09/2021 1.01 (H) 0.57 - 1.00 mg/dL Final         Passed - HBA1C is between 0 and 7.9 and within 180 days    Hgb A1c MFr Bld  Date Value Ref Range Status  10/09/2021 6.0 (H) 4.8 - 5.6 % Final    Comment:             Prediabetes: 5.7 - 6.4          Diabetes: >6.4          Glycemic control for adults with diabetes: <7.0          Passed - Valid encounter within last 6 months    Recent Outpatient Visits          1 week ago Osteoarthritis of right knee, unspecified osteoarthritis type   [facility] Montel Culver, MD   1 month ago Peroneal tendinitis, right   [facility] Montel Culver, MD   2 months ago Peroneal tendinitis, right   [facility] Montel Culver, MD   2 months ago Peroneal tendinitis, right   [facility] Montel Culver, MD   2 months ago Controlled type 2 diabetes mellitus without complication, without long-term current use of insulin [facility])   [facility] Juline Patch, MD      Future Appointments            In 2 months Zigmund Daniel, Earley Abide, MD [facility], [place]

## 2021-12-15 ENCOUNTER — Telehealth: Payer: Self-pay

## 2021-12-15 ENCOUNTER — Other Ambulatory Visit: Payer: Self-pay

## 2021-12-15 ENCOUNTER — Telehealth: Payer: Self-pay | Admitting: Family Medicine

## 2021-12-15 DIAGNOSIS — E119 Type 2 diabetes mellitus without complications: Secondary | ICD-10-CM

## 2021-12-15 MED ORDER — METFORMIN HCL ER 750 MG PO TB24: 1500.0000 mg | ORAL_TABLET | Freq: Every day | ORAL | 0 refills | Status: DC

## 2021-12-15 NOTE — Telephone Encounter (Signed)
Patient called in requesting a prescription that she previously had filled by a provider while living in [place]. She last got her prescription for metformin in the mail from an order 3 months ago. She would like for her provider to send the '750mg'$  2 pills once a day and not the 500 mg to her mail order pharmacy  [place], [place] Phone:  [phone]  Fax:  [phone]     Please assist further.

## 2021-12-15 NOTE — Telephone Encounter (Signed)
Pt called in to ask about switching her metformin 500 to 750 XR 2 tabs daily= 1,'500mg'$ . SHe was taking 3 of the '500mg'$  XR. I sent in the 750s and went over her labs from previous visit with her. I scheduled follow up appt for Aug 11

## 2021-12-15 NOTE — Telephone Encounter (Signed)
Patient is requesting a call back about results of her labwork done on July 11th. She has questions regarding her Calcium & Vitamin D supplements. Can she resume taking them and questions about her low phosphorus. Please assist further.

## 2021-12-16 ENCOUNTER — Other Ambulatory Visit: Payer: Self-pay | Admitting: Family Medicine

## 2021-12-16 ENCOUNTER — Encounter: Payer: Self-pay | Admitting: Family Medicine

## 2021-12-16 ENCOUNTER — Other Ambulatory Visit: Payer: Self-pay

## 2021-12-16 DIAGNOSIS — G8929 Other chronic pain: Secondary | ICD-10-CM

## 2021-12-16 DIAGNOSIS — M25561 Pain in right knee: Secondary | ICD-10-CM

## 2021-12-16 MED ORDER — FLUOXETINE HCL 10 MG PO CAPS: 20.0000 mg | ORAL_CAPSULE | Freq: Every day | ORAL | 0 refills | Status: DC

## 2021-12-16 MED ORDER — FLUOXETINE HCL 10 MG PO CAPS: 10.0000 mg | ORAL_CAPSULE | Freq: Every day | ORAL | 0 refills | Status: DC

## 2021-12-16 NOTE — Telephone Encounter (Signed)
Please review.  KP

## 2021-12-17 NOTE — Telephone Encounter (Signed)
Requested medication (s) are due for refill today: historical medication  Requested medication (s) are on the active medication list: yes  Last refill:  11/17/21  Future visit scheduled: yes in 2 weeks  Notes to clinic:  historical medication  do you want to order Rx?     Requested Prescriptions  Pending Prescriptions Disp Refills   diclofenac (VOLTAREN) 75 MG EC tablet [Pharmacy Med Name: DICLOFENAC SOD EC 75 MG TAB] 60 tablet     Sig: TAKE 1 TABLET BY MOUTH TWICE A DAY AS NEEDED     Analgesics:  NSAIDS Failed - 12/16/2021  2:12 AM      Failed - Manual Review: Labs are only required if the patient has taken medication for more than 8 weeks.      Failed - Cr in normal range and within 360 days    Creatinine, Ser  Date Value Ref Range Status  10/09/2021 1.01 (H) 0.57 - 1.00 mg/dL Final         Failed - HGB in normal range and within 360 days    No results found for: "HGB", "HGBKUC", "HGBPOCKUC", "HGBOTHER", "TOTHGB", "HGBPLASMA"       Failed - PLT in normal range and within 360 days    No results found for: "PLT", "PLTCOUNTKUC", "LABPLAT", "POCPLA"       Failed - HCT in normal range and within 360 days    No results found for: "HCT", "HCTKUC", "SRHCT"       Passed - eGFR is 30 or above and within 360 days    eGFR  Date Value Ref Range Status  10/09/2021 60 >59 mL/min/1.73 Final         Passed - Patient is not pregnant      Passed - Valid encounter within last 12 months    Recent Outpatient Visits           2 weeks ago Osteoarthritis of right knee, unspecified osteoarthritis type   [facility] Montel Culver, MD   2 months ago Peroneal tendinitis, right   [facility] Montel Culver, MD   2 months ago Peroneal tendinitis, right   [facility] Montel Culver, MD   2 months ago Peroneal tendinitis, right   [facility] Montel Culver, MD   2 months ago Controlled type 2 diabetes mellitus without complication, without  long-term current use of insulin [facility])   [facility] Juline Patch, MD       Future Appointments             In 2 weeks Juline Patch, MD [facility], [place]   In 1 month Zigmund Daniel, Earley Abide, MD [facility], [facility]

## 2021-12-18 ENCOUNTER — Other Ambulatory Visit: Payer: Self-pay

## 2021-12-18 DIAGNOSIS — E119 Type 2 diabetes mellitus without complications: Secondary | ICD-10-CM

## 2021-12-18 MED ORDER — METFORMIN HCL ER 750 MG PO TB24: 1500.0000 mg | ORAL_TABLET | Freq: Every day | ORAL | 0 refills | Status: DC

## 2021-12-22 ENCOUNTER — Ambulatory Visit: Payer: Medicare HMO | Admitting: Nurse Practitioner

## 2021-12-22 ENCOUNTER — Encounter: Payer: Self-pay | Admitting: Nurse Practitioner

## 2021-12-22 DIAGNOSIS — Z0000 Encounter for general adult medical examination without abnormal findings: Secondary | ICD-10-CM

## 2021-12-22 NOTE — Patient Instructions (Addendum)
Sharon Kaufman , Thank you for taking time to come for your Medicare Wellness Visit. I appreciate your ongoing commitment to your health goals. Please review the following plan we discussed and let me know if I can assist you in the future.   Screening recommendations/referrals: Colonoscopy: 2021 with [facility] unable to view results  Mammogram: 2021 with [facility] hx of Breast CA needs order for next Mammo overdue Bone Density: 2021 with [facility] unable to view results patient believes Osteopenia at that time  Recommended yearly ophthalmology/optometry visit for glaucoma screening and checkup Recommended yearly dental visit for hygiene and checkup Need for diabetic foot exam at Podiatry, has not established since moving to [place]   Vaccinations: Influenza vaccine: 12/2021 Pneumococcal vaccine: up to date  Tdap vaccine: up to date next 2031 Shingles vaccine: Has started series, had reaction to first dose     Advanced directives: Needs updating send information   Conditions/risks identified: Diabetic, goals of weight loss, interested in meeting with Endocrinology   Next appointment: 01/02/2022   Preventive Care 33 Years and Older, Female Preventive care refers to lifestyle choices and visits with your health care provider that can promote health and wellness. What does preventive care include? A yearly physical exam. This is also called an annual well check. Dental exams once or twice a year. Routine eye exams. Ask your health care provider how often you should have your eyes checked. Personal lifestyle choices, including: Daily care of your teeth and gums. Regular physical activity. Eating a healthy diet. Avoiding tobacco and drug use. Limiting alcohol use. Practicing safe sex. Taking low-dose aspirin every day. Taking vitamin and mineral supplements as recommended by your health care provider. What happens during an annual well check? The services and screenings done by your  health care provider during your annual well check will depend on your age, overall health, lifestyle risk factors, and family history of disease. Counseling  Your health care provider may ask you questions about your: Alcohol use. Tobacco use. Drug use. Emotional well-being. Home and relationship well-being. Sexual activity. Eating habits. History of falls. Memory and ability to understand (cognition). Work and work Statistician. Reproductive health. Screening  You may have the following tests or measurements: Height, weight, and BMI. Blood pressure. Lipid and cholesterol levels. These may be checked every 5 years, or more frequently if you are over 62 years old. Skin check. Lung cancer screening. You may have this screening every year starting at age 33 if you have a 30-pack-year history of smoking and currently smoke or have quit within the past 15 years. Fecal occult blood test (FOBT) of the stool. You may have this test every year starting at age 21. Flexible sigmoidoscopy or colonoscopy. You may have a sigmoidoscopy every 5 years or a colonoscopy every 10 years starting at age 53. Hepatitis C blood test. Hepatitis B blood test. Sexually transmitted disease (STD) testing. Diabetes screening. This is done by checking your blood sugar (glucose) after you have not eaten for a while (fasting). You may have this done every 1-3 years. Bone density scan. This is done to screen for osteoporosis. You may have this done starting at age 55. Mammogram. This may be done every 1-2 years. Talk to your health care provider about how often you should have regular mammograms. Talk with your health care provider about your test results, treatment options, and if necessary, the need for more tests. Vaccines  Your health care provider may recommend certain vaccines, such as: Influenza  vaccine. This is recommended every year. Tetanus, diphtheria, and acellular pertussis (Tdap, Td) vaccine. You may  need a Td booster every 10 years. Zoster vaccine. You may need this after age 32. Pneumococcal 13-valent conjugate (PCV13) vaccine. One dose is recommended after age 35. Pneumococcal polysaccharide (PPSV23) vaccine. One dose is recommended after age 56. Talk to your health care provider about which screenings and vaccines you need and how often you need them. This information is not intended to replace advice given to you by your health care provider. Make sure you discuss any questions you have with your health care provider. Document Released: 06/07/2015 Document Revised: 01/29/2016 Document Reviewed: 03/12/2015 Elsevier Interactive Patient Education  2017 [place] Prevention in the Home Falls can cause injuries. They can happen to people of all ages. There are many things you can do to make your home safe and to help prevent falls. What can I do on the outside of my home? Regularly fix the edges of walkways and driveways and fix any cracks. Remove anything that might make you trip as you walk through a door, such as a raised step or threshold. Trim any bushes or trees on the path to your home. Use bright outdoor lighting. Clear any walking paths of anything that might make someone trip, such as rocks or tools. Regularly check to see if handrails are loose or broken. Make sure that both sides of any steps have handrails. Any raised decks and porches should have guardrails on the edges. Have any leaves, snow, or ice cleared regularly. Use sand or salt on walking paths during winter. Clean up any spills in your garage right away. This includes oil or grease spills. What can I do in the bathroom? Use night lights. Install grab bars by the toilet and in the tub and shower. Do not use towel bars as grab bars. Use non-skid mats or decals in the tub or shower. If you need to sit down in the shower, use a plastic, non-slip stool. Keep the floor dry. Clean up any water that spills on  the floor as soon as it happens. Remove soap buildup in the tub or shower regularly. Attach bath mats securely with double-sided non-slip rug tape. Do not have throw rugs and other things on the floor that can make you trip. What can I do in the bedroom? Use night lights. Make sure that you have a light by your bed that is easy to reach. Do not use any sheets or blankets that are too big for your bed. They should not hang down onto the floor. Have a firm chair that has side arms. You can use this for support while you get dressed. Do not have throw rugs and other things on the floor that can make you trip. What can I do in the kitchen? Clean up any spills right away. Avoid walking on wet floors. Keep items that you use a lot in easy-to-reach places. If you need to reach something above you, use a strong step stool that has a grab bar. Keep electrical cords out of the way. Do not use floor polish or wax that makes floors slippery. If you must use wax, use non-skid floor wax. Do not have throw rugs and other things on the floor that can make you trip. What can I do with my stairs? Do not leave any items on the stairs. Make sure that there are handrails on both sides of the stairs and use them. Fix  handrails that are broken or loose. Make sure that handrails are as long as the stairways. Check any carpeting to make sure that it is firmly attached to the stairs. Fix any carpet that is loose or worn. Avoid having throw rugs at the top or bottom of the stairs. If you do have throw rugs, attach them to the floor with carpet tape. Make sure that you have a light switch at the top of the stairs and the bottom of the stairs. If you do not have them, ask someone to add them for you. What else can I do to help prevent falls? Wear shoes that: Do not have high heels. Have rubber bottoms. Are comfortable and fit you well. Are closed at the toe. Do not wear sandals. If you use a stepladder: Make sure  that it is fully opened. Do not climb a closed stepladder. Make sure that both sides of the stepladder are locked into place. Ask someone to hold it for you, if possible. Clearly mark and make sure that you can see: Any grab bars or handrails. First and last steps. Where the edge of each step is. Use tools that help you move around (mobility aids) if they are needed. These include: Canes. Walkers. Scooters. Crutches. Turn on the lights when you go into a dark area. Replace any light bulbs as soon as they burn out. Set up your furniture so you have a clear path. Avoid moving your furniture around. If any of your floors are uneven, fix them. If there are any pets around you, be aware of where they are. Review your medicines with your doctor. Some medicines can make you feel dizzy. This can increase your chance of falling. Ask your doctor what other things that you can do to help prevent falls. This information is not intended to replace advice given to you by your health care provider. Make sure you discuss any questions you have with your health care provider. Document Released: 03/07/2009 Document Revised: 10/17/2015 Document Reviewed: 06/15/2014 Elsevier Interactive Patient Education  2017 [employer].

## 2021-12-22 NOTE — Progress Notes (Signed)
I connected with  Sharon Kaufman on 12/22/21 by a video and audio enabled telemedicine application and verified that I am speaking with the correct person using two identifiers.  Patient Location: Home  Provider Location: Home Office  I discussed the limitations of evaluation and management by telemedicine. The patient expressed understanding and agreed to proceed.   Annual Wellness Visit (Previous AWV with [facility]) initial with current PCP     Patient: Sharon Kaufman, Female    DOB: 1951/02/16, 71 y.o.   MRN: [id number]  Subjective  Chief Complaint  Patient presents with   [place] well visit     Sharon Kaufman is a 71 y.o. female who presents today for her Annual Wellness Visit. She reports consuming a diabetic, low carb diet. Home exercise routine includes treadmill. She generally feels well. She reports sleeping well. She does not have additional problems to discuss today.   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   Statin intolerance 03/21/2021   Acquired trigger finger of right middle finger 03/10/2021   Ankylosis of ankle joint, left 03/10/2021   Cirrhosis ([place]) 07/25/2020   Bronchiectasis without complication ([place]) 71/10/2692   Deviated septum 03/22/2020   Allergic rhinitis due to pollen 03/22/2020   Overweight (BMI 25.0-29.9) 12/06/2019   Hypertrophy of inter-atrial septum 10/02/2019   History of cardiovascular stress test 85/46/2703   Diastolic dysfunction without heart failure 10/02/2019   Dyspareunia in female  08/02/2019   Cataract, nuclear sclerotic senile, right 07/24/2019   Fibromyalgia 07/11/2019   History of hepatitis C 07/11/2019   Rheumatoid factor positive 06/06/2019   Other insomnia 06/06/2019   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   Essential hypertension 06/06/2019   Diabetes type 2, controlled ([place]) 06/06/2019   Anxiety 06/06/2019   Age-related osteoporosis without current pathological fracture 01/03/2013   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/2012   Past Medical History:  Diagnosis Date   Allergy    Anxiety    Breast cancer, left ([place]) 2008   Diabetes mellitus without complication ([place])    GERD (gastroesophageal reflux disease)    Hyperlipidemia    Hypertension    Past Surgical History:  Procedure Laterality Date   ANKLE FUSION Left 2009   APPENDECTOMY  2009   BREAST LUMPECTOMY Left 2008   ROTATOR CUFF REPAIR Left 2011   SPLENECTOMY  1973   TOTAL SHOULDER REPLACEMENT Right 2019   Social History   Tobacco Use   Smoking status: Former    Types: Cigarettes    Quit date: 2008    Years since quitting: 15.5   Smokeless tobacco: Never  Vaping Use   Vaping Use: Never used  Substance Use Topics   Alcohol use: Yes    Alcohol/week: 2.0 standard drinks of alcohol    Types: 2 Glasses of wine per week   Drug use: Never   Family Status  Relation Name Status   Mother  Alive   Father  Deceased   Family History  Problem Relation Age of Onset  Rheum arthritis Mother    Alcohol abuse Father    Liver disease Father    Allergies  Allergen Reactions   Quetiapine Rash   Alendronate Other (See Comments)    dyspepsia Other reaction(s): Other dyspepsia   Atorvastatin Other (See Comments)    Other reaction(s): Other myalgia myalgia    Codeine Itching and Nausea Only   Pravastatin Other (See Comments)    myalgias   Hydrocodone-Acetaminophen       Medications: Outpatient Medications Prior to Visit   Medication Sig   Accu-Chek FastClix Lancets MISC 1 each by Other route daily.   ACCU-CHEK GUIDE test strip 1 each by Other route daily.   amitriptyline (ELAVIL) 10 MG tablet Take 1 tablet (10 mg total) by mouth at bedtime. (Patient taking differently: Take 20 mg by mouth at bedtime.)   Ascorbic Acid (VITAMIN C) 1000 MG tablet Take 1,000 mg by mouth daily.   aspirin 81 MG EC tablet Take 81 mg by mouth daily.   CALCIUM-VITAMIN D PO Take 2 tablets by mouth daily.   Carboxymethylcellul-Glycerin (REFRESH RELIEVA OP) Apply 1 drop to eye daily.   cetirizine (ZYRTEC) 10 MG tablet Take 10 mg by mouth daily.   diclofenac (VOLTAREN) 75 MG EC tablet Take 75 mg by mouth 2 (two) times daily as needed.   ezetimibe (ZETIA) 10 MG tablet TAKE 1 TABLET BY MOUTH EVERY DAY   FLUoxetine (PROZAC) 10 MG capsule Take 1 capsule (10 mg total) by mouth daily.   glipiZIDE (GLUCOTROL XL) 2.5 MG 24 hr tablet TAKE 1 TABLET (2.5 MG TOTAL) BY MOUTH DAILY WITH BREAKFAST.   lisinopril (ZESTRIL) 2.5 MG tablet Take 2.5 mg by mouth daily.   LYSINE PO Take 1 tablet by mouth daily.   MAGNESIUM PO Take by mouth at bedtime.   metFORMIN (GLUCOPHAGE-XR) 750 MG 24 hr tablet Take 2 tablets (1,500 mg total) by mouth daily with breakfast.   omeprazole (PRILOSEC) 20 MG capsule Take 20 mg by mouth daily.   zinc gluconate 50 MG tablet Take 50 mg by mouth daily.   No facility-administered medications prior to visit.    Allergies  Allergen Reactions   Quetiapine Rash   Alendronate Other (See Comments)    dyspepsia Other reaction(s): Other dyspepsia   Atorvastatin Other (See Comments)    Other reaction(s): Other myalgia myalgia    Codeine Itching and Nausea Only   Pravastatin Other (See Comments)    myalgias   Hydrocodone-Acetaminophen     Patient Care Team: Juline Patch, MD as PCP - General (Family Medicine)  ROS  Nutrition Risk Assessment:  Has the patient had any N/V/D within the last 2 months?  No  Does the  patient have any non-healing wounds?  No  Has the patient had any unintentional weight loss or weight gain?  Yes  Yes weight gain  Diabetes:  Is the patient diabetic?  Yes  If diabetic, was a CBG obtained today?  Yes  (104) Did the patient bring in their glucometer from home?  No  How often do you monitor your CBG's? Daily in am   Financial Strains and Diabetes Management:  Are you having any financial strains with the device, your supplies or your medication? No .  Does the patient want to be seen by Chronic Care Management for management of their diabetes?  Yes  Would the patient like to be referred to a Nutritionist or for Diabetic Management?  No   Diabetic Exams:  Diabetic Eye Exam: Completed  6 months prior to visit today  Diabetic Foot Exam: Completed in the past year      Objective   Physical Exam    Most recent functional status assessment:    12/22/2021    2:56 PM  In your present state of health, do you have any difficulty performing the following activities:  Hearing? 1  Comment has hearing aid  Vision? 0  Comment wears glasses  Difficulty concentrating or making decisions? 0  Walking or climbing stairs? 0  Dressing or bathing? 0  Doing errands, shopping? 0  Preparing Food and eating ? N  Using the Toilet? N  In the past six months, have you accidently leaked urine? N  Do you have problems with loss of bowel control? N  Managing your Medications? N  Managing your Finances? N  Housekeeping or managing your Housekeeping? N   Most recent fall risk assessment:    12/22/2021    2:54 PM  Fall Risk   Falls in the past year? 0  Number falls in past yr: 0  Injury with Fall? 0  Risk for fall due to : No Fall Risks  Follow up Falls evaluation completed    Most recent depression screenings:    12/22/2021    2:56 PM 12/22/2021    2:49 PM  PHQ 2/9 Scores  PHQ - 2 Score 0 0  PHQ- 9 Score 6 6   Most recent cognitive screening:    12/22/2021    2:58 PM   6CIT Screen  What Year? 0 points  What month? 0 points  What time? 0 points  Count back from 20 0 points  Months in reverse 0 points  Repeat phrase 0 points  Total Score 0 points   Most recent Audit-C alcohol use screening    12/22/2021    2:53 PM  Alcohol Use Disorder Test (AUDIT)  1. How often do you have a drink containing alcohol? 2  2. How many drinks containing alcohol do you have on a typical day when you are drinking? 0  3. How often do you have six or more drinks on one occasion? 0  AUDIT-C Score 2   A score of 3 or more in women, and 4 or more in men indicates increased risk for alcohol abuse, EXCEPT if all of the points are from question 1     No results found for any visits on 12/22/21.    Assessment & Plan   Annual wellness visit done today including the all of the following: Reviewed patient's Family Medical History Reviewed and updated list of patient's medical providers Assessment of cognitive impairment was done Assessed patient's functional ability Established a written schedule for health screening [place] Completed and Reviewed  Exercise Activities and Dietary recommendations  Goals      Set My Weight Loss Goal     Lose Weight Goal would like to lose 40lbs this year         Immunization History  Administered Date(s) Administered   Pneumococcal Conjugate-13 09/02/2017   Pneumococcal Polysaccharide-23 12/07/2018   Td 12/28/2019   Tdap 04/25/2018   Zoster, Live 10/17/2014    Health Maintenance  Topic Date Due   COVID-19 Vaccine (1) Never done   FOOT EXAM  Never done   OPHTHALMOLOGY EXAM  Never done   Zoster Vaccines- Shingrix (1 of 2) 12/31/2021 (Originally 05/22/2002)   MAMMOGRAM  10/01/2022 (Originally 05/22/2002)   DEXA SCAN  10/01/2022 (Originally 05/22/2017)   COLONOSCOPY (  Pts 45-63yr Insurance coverage will need to be confirmed)  10/01/2022 (Originally 05/22/1997)   INFLUENZA VACCINE  12/23/2021    HEMOGLOBIN A1C  04/11/2022   TETANUS/TDAP  12/27/2029   Pneumonia Vaccine 70 Years old  Completed   Hepatitis C Screening  Completed   HPV VACCINES  Aged Out     Discussed health benefits of physical activity, and encouraged her to engage in regular exercise appropriate for her age and condition.    Problem List Items Addressed This Visit   None  Patient is interested in meeting with endocrinology to discuss Diabetic management further  Goals for weight loss this year   Scheduled health maintenance:  Dexa Scan at [facility] 2021 (osteopenia per patient)  Last colonoscopy at [facility] 2021 Last Mammogram 09/2020 (history of breast CA) overdue  Need for second Shingles vaccine (had reaction to first injection) evaluate in office     SApolonio Schneiders FNP

## 2021-12-23 ENCOUNTER — Other Ambulatory Visit: Payer: Self-pay

## 2021-12-23 ENCOUNTER — Telehealth: Payer: Self-pay

## 2021-12-23 DIAGNOSIS — M8580 Other specified disorders of bone density and structure, unspecified site: Secondary | ICD-10-CM

## 2021-12-23 NOTE — Telephone Encounter (Signed)
I returned pt's call. I ordered Bone density and gave her info for [employer] for weight management. I also looked for her echo cardiogram she had done in the past. I can't see it on my end, advised her to try to get it from her previous doctor. She would like to go to cardiology. I explained that we can do breast exam at Aug 11 appt, but will need to schedule another appt to go to different referrals she may be wanting. We will order her mammo when she comes in on the 11th.

## 2021-12-28 ENCOUNTER — Other Ambulatory Visit: Payer: Self-pay | Admitting: Family Medicine

## 2021-12-28 DIAGNOSIS — E7800 Pure hypercholesterolemia, unspecified: Secondary | ICD-10-CM

## 2021-12-29 DIAGNOSIS — Z79899 Other long term (current) drug therapy: Secondary | ICD-10-CM | POA: Diagnosis not present

## 2021-12-29 DIAGNOSIS — F411 Generalized anxiety disorder: Secondary | ICD-10-CM | POA: Diagnosis not present

## 2022-01-02 ENCOUNTER — Ambulatory Visit (INDEPENDENT_AMBULATORY_CARE_PROVIDER_SITE_OTHER): Payer: Medicare HMO | Admitting: Family Medicine

## 2022-01-02 ENCOUNTER — Encounter: Payer: Self-pay | Admitting: Family Medicine

## 2022-01-02 VITALS — BP 124/84 | HR 80 | Ht 63.5 in | Wt 172.0 lb

## 2022-01-02 DIAGNOSIS — Z1231 Encounter for screening mammogram for malignant neoplasm of breast: Secondary | ICD-10-CM | POA: Diagnosis not present

## 2022-01-02 DIAGNOSIS — E7800 Pure hypercholesterolemia, unspecified: Secondary | ICD-10-CM

## 2022-01-02 DIAGNOSIS — Z789 Other specified health status: Secondary | ICD-10-CM

## 2022-01-02 DIAGNOSIS — I10 Essential (primary) hypertension: Secondary | ICD-10-CM | POA: Diagnosis not present

## 2022-01-02 DIAGNOSIS — L84 Corns and callosities: Secondary | ICD-10-CM

## 2022-01-02 DIAGNOSIS — E119 Type 2 diabetes mellitus without complications: Secondary | ICD-10-CM | POA: Diagnosis not present

## 2022-01-02 MED ORDER — GLIPIZIDE ER 2.5 MG PO TB24: 2.5000 mg | ORAL_TABLET | Freq: Every day | ORAL | 1 refills | Status: DC

## 2022-01-02 MED ORDER — EZETIMIBE 10 MG PO TABS: 10.0000 mg | ORAL_TABLET | Freq: Every day | ORAL | 1 refills | Status: DC

## 2022-01-02 MED ORDER — METFORMIN HCL ER 750 MG PO TB24: 1500.0000 mg | ORAL_TABLET | Freq: Every day | ORAL | 0 refills | Status: DC

## 2022-01-02 MED ORDER — LISINOPRIL 2.5 MG PO TABS: 2.5000 mg | ORAL_TABLET | Freq: Every day | ORAL | 1 refills | Status: DC

## 2022-01-02 NOTE — Progress Notes (Unsigned)
Date:  01/02/2022   Name:  Sharon Kaufman   DOB:  05/13/1951   MRN:  [id number]   Chief Complaint: Hypertension, Hyperlipidemia, Diabetes (Needs foot exam), and breast exam  Hypertension This is a chronic problem. The current episode started more than 1 year ago. The problem has been gradually improving since onset. The problem is controlled. Pertinent negatives include no blurred vision, chest pain, headaches, neck pain, orthopnea, palpitations, PND or shortness of breath. Risk factors for coronary artery disease include dyslipidemia. Past treatments include ACE inhibitors. The current treatment provides moderate improvement. There are no compliance problems.  There is no history of angina, kidney disease, CAD/MI, CVA, heart failure, left ventricular hypertrophy, PVD or retinopathy. There is no history of chronic renal disease, a hypertension causing med or renovascular disease.  Hyperlipidemia This is a chronic problem. The current episode started more than 1 year ago. The problem is controlled. Recent lipid tests were reviewed and are normal. She has no history of chronic renal disease, diabetes, hypothyroidism, liver disease, obesity or nephrotic syndrome. Pertinent negatives include no chest pain, myalgias or shortness of breath. Current antihyperlipidemic treatment includes ezetimibe. The current treatment provides moderate improvement of lipids. There are no compliance problems.   Diabetes She presents for her follow-up diabetic visit. She has type 2 diabetes mellitus. The initial diagnosis of diabetes was made 16 years ago. Her disease course has been worsening. Pertinent negatives for hypoglycemia include no dizziness, headaches or nervousness/anxiousness. Pertinent negatives for diabetes include no blurred vision, no chest pain, no fatigue, no foot paresthesias, no polydipsia, no polyphagia, no polyuria and no weight loss. Symptoms are stable. Pertinent negatives for diabetic complications  include no CVA, peripheral neuropathy, PVD or retinopathy. There are no known risk factors for coronary artery disease. Current diabetic treatment includes oral agent (dual therapy). She is compliant with treatment some of the time. She is following a generally healthy diet. Meal planning includes avoidance of concentrated sweets and carbohydrate counting. She participates in exercise intermittently. Her breakfast blood glucose is taken between 8-9 am. Her breakfast blood glucose range is generally 110-130 mg/dl. An ACE inhibitor/angiotensin II receptor blocker is being taken.    Lab Results  Component Value Date   NA 137 10/09/2021   K 5.3 (H) 10/09/2021   CO2 20 10/09/2021   GLUCOSE 102 (H) 10/09/2021   BUN 25 10/09/2021   CREATININE 1.01 (H) 10/09/2021   CALCIUM 9.9 12/02/2021   EGFR 60 10/09/2021   Lab Results  Component Value Date   CHOL 239 (H) 10/09/2021   HDL 69 10/09/2021   LDLCALC 149 (H) 10/09/2021   TRIG 118 10/09/2021   No results found for: "TSH" Lab Results  Component Value Date   HGBA1C 6.0 (H) 10/09/2021   No results found for: "WBC", "HGB", "HCT", "MCV", "PLT" No results found for: "ALT", "AST", "GGT", "ALKPHOS", "BILITOT" No results found for: "25OHVITD2", "25OHVITD3", "VD25OH"   Review of Systems  Constitutional:  Negative for chills, fatigue, fever and weight loss.  HENT:  Negative for drooling, ear discharge, ear pain and sore throat.   Eyes:  Negative for blurred vision.  Respiratory:  Negative for cough, shortness of breath and wheezing.   Cardiovascular:  Negative for chest pain, palpitations, orthopnea, leg swelling and PND.  Gastrointestinal:  Negative for abdominal pain, blood in stool, constipation, diarrhea and nausea.  Endocrine: Negative for polydipsia, polyphagia and polyuria.  Genitourinary:  Negative for dysuria, frequency, hematuria and urgency.  Musculoskeletal:  Negative for  back pain, myalgias and neck pain.  Skin:  Negative for rash.   Allergic/Immunologic: Negative for environmental allergies.  Neurological:  Negative for dizziness and headaches.  Hematological:  Does not bruise/bleed easily.  Psychiatric/Behavioral:  Negative for suicidal ideas. The patient is not nervous/anxious.     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   Statin intolerance 03/21/2021   Acquired trigger finger of right middle finger 03/10/2021   Ankylosis of ankle joint, left 03/10/2021   Cirrhosis (HCC) 07/25/2020   Bronchiectasis without complication (HCC) 07/03/2020   Deviated septum 03/22/2020   Allergic rhinitis due to pollen 03/22/2020   Overweight (BMI 25.0-29.9) 12/06/2019   Hypertrophy of inter-atrial septum 10/02/2019   History of cardiovascular stress test 10/02/2019   Diastolic dysfunction without heart failure 10/02/2019   Dyspareunia in female 08/02/2019   Cataract, nuclear sclerotic senile, right 07/24/2019   Fibromyalgia 07/11/2019   History of hepatitis C 07/11/2019   Rheumatoid factor positive 06/06/2019   Other insomnia 06/06/2019   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   Essential hypertension 06/06/2019   Diabetes type 2, controlled (HCC) 06/06/2019   Anxiety 06/06/2019   Age-related osteoporosis without current pathological fracture 01/03/2013   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/2012    Allergies  Allergen Reactions   Quetiapine Rash   Alendronate Other (See Comments)    dyspepsia Other reaction(s): Other dyspepsia   Atorvastatin Other (See Comments)    Other reaction(s): Other myalgia myalgia     Codeine Itching and Nausea Only   Pravastatin Other (See Comments)    myalgias   Hydrocodone-Acetaminophen     Past Surgical History:  Procedure Laterality Date   ANKLE FUSION Left 2009   APPENDECTOMY  2009   BREAST LUMPECTOMY Left 2008   ROTATOR CUFF REPAIR Left 2011   SPLENECTOMY  1973   TOTAL SHOULDER REPLACEMENT Right 2019    Social History   Tobacco Use   Smoking status: Former    Types: Cigarettes    Quit date: 2008    Years since quitting: 15.6   Smokeless tobacco: Never  Vaping Use   Vaping Use: Never used  Substance Use Topics   Alcohol use: Yes    Alcohol/week: 2.0 standard drinks of alcohol    Types: 2 Glasses of wine per week   Drug use: Never     Medication list has been reviewed and updated.  Current Meds  Medication Sig   Accu-Chek FastClix Lancets MISC 1 each by Other route daily.   ACCU-CHEK GUIDE test strip 1 each by Other route daily.   amitriptyline (ELAVIL) 10 MG tablet Take 1 tablet (10 mg total) by mouth at bedtime. (Patient taking differently: Take 20 mg by mouth at bedtime.)   Ascorbic Acid (VITAMIN C) 1000 MG tablet Take 1,000 mg by mouth daily.   aspirin 81 MG EC tablet Take 81 mg by mouth daily.   Carboxymethylcellul-Glycerin (REFRESH RELIEVA OP) Apply 1 drop to eye daily.   cetirizine (ZYRTEC) 10 MG tablet Take 10 mg by mouth daily.   ezetimibe (ZETIA) 10 MG tablet TAKE 1 TABLET BY MOUTH EVERY DAY  FLUoxetine (PROZAC) 10 MG capsule Take 1 capsule (10 mg total) by mouth daily.   glipiZIDE (GLUCOTROL XL) 2.5 MG 24 hr tablet TAKE 1 TABLET (2.5 MG TOTAL) BY MOUTH DAILY WITH BREAKFAST.   lisinopril (ZESTRIL) 2.5 MG tablet Take 2.5 mg by mouth daily.   LYSINE PO Take 1 tablet by mouth daily.   MAGNESIUM PO Take by mouth at bedtime.   omeprazole (PRILOSEC) 20 MG capsule Take 20 mg by mouth daily.   zinc gluconate 50 MG tablet Take 50 mg by mouth daily.   zolpidem (AMBIEN) 5 MG tablet Take 5 mg by mouth at bedtime. Psych       12/02/2021     1:41 PM 10/16/2021    4:04 PM 10/09/2021    3:01 PM 10/03/2021    2:15 PM  GAD 7 : Generalized Anxiety Score  Nervous, Anxious, on Edge 0 1 1 2   Control/stop worrying 1 3 3 3   Worry too much - different things 1 3 3 3   Trouble relaxing 0 1 1 1   Restless 0 0 0 0  Easily annoyed or irritable 0 3 3 0  Afraid - awful might happen 1 3  3   Total GAD 7 Score 3 14  12   Anxiety Difficulty Not difficult at all Not difficult at all Not difficult at all        12/22/2021    2:56 PM 12/22/2021    2:49 PM 12/02/2021    1:41 PM  Depression screen PHQ 2/9  Decreased Interest 0 0 0  Down, Depressed, Hopeless 0 0 0  PHQ - 2 Score 0 0 0  Altered sleeping 0 0 3  Tired, decreased energy 3 3 2   Change in appetite 3 3 2   Feeling bad or failure about yourself  0 0 0  Trouble concentrating 0 0 0  Moving slowly or fidgety/restless 0 0 0  Suicidal thoughts 0 0 0  PHQ-9 Score 6 6 7   Difficult doing work/chores Not difficult at all Not difficult at all Not difficult at all    BP Readings from Last 3 Encounters:  01/02/22 124/84  12/02/21 122/76  10/16/21 130/90    Physical Exam Vitals and nursing note reviewed. Exam conducted with a chaperone present.  Constitutional:      General: She is not in acute distress.    Appearance: She is not diaphoretic.  HENT:     Head: Normocephalic and atraumatic.     Right Ear: Tympanic membrane and external ear normal.     Left Ear: Tympanic membrane and external ear normal.     Nose: Nose normal.     Mouth/Throat:     Mouth: Mucous membranes are moist.  Eyes:     General:        Right eye: No discharge.        Left eye: No discharge.     Conjunctiva/sclera: Conjunctivae normal.     Pupils: Pupils are equal, round, and reactive to light.  Neck:     Thyroid: No thyromegaly.     Vascular: No JVD.  Cardiovascular:     Rate and Rhythm: Normal rate and regular rhythm.     Heart sounds: Normal heart sounds. No murmur heard.    No friction rub. No gallop.   Pulmonary:     Effort: Pulmonary effort is normal.     Breath sounds: Normal breath sounds.  Abdominal:     General: Bowel sounds are normal.     Palpations:  Abdomen is soft. There is no mass.     Tenderness: There is no abdominal tenderness. There is no guarding.  Musculoskeletal:        General: Normal range of motion.     Cervical back: Normal range of motion and neck supple.  Lymphadenopathy:     Cervical: No cervical adenopathy.  Skin:    General: Skin is warm and dry.  Neurological:     Mental Status: She is alert.     Deep Tendon Reflexes: Reflexes are normal and symmetric.     Wt Readings from Last 3 Encounters:  01/02/22 172 lb (78 kg)  12/02/21 172 lb 12.8 oz (78.4 kg)  10/09/21 170 lb (77.1 kg)    BP 124/84   Pulse 80   Ht 5' 3.5" (1.613 m)   Wt 172 lb (78 kg)   SpO2 96%   BMI 29.99 kg/m   Assessment and Plan:     Otilio Miu, MD

## 2022-01-03 LAB — COMPREHENSIVE METABOLIC PANEL
ALT: 20 IU/L (ref 0–32)
AST: 24 IU/L (ref 0–40)
Albumin/Globulin Ratio: 1.4 (ref 1.2–2.2)
Albumin: 4.4 g/dL (ref 3.9–4.9)
Alkaline Phosphatase: 87 IU/L (ref 44–121)
BUN/Creatinine Ratio: 16 (ref 12–28)
BUN: 11 mg/dL (ref 8–27)
Bilirubin Total: <0.2 mg/dL (ref 0.0–1.2)
CO2: 23 mmol/L (ref 20–29)
Calcium: 9.7 mg/dL (ref 8.7–10.3)
Chloride: 97 mmol/L (ref 96–106)
Creatinine, Ser: 0.68 mg/dL (ref 0.57–1.00)
Globulin, Total: 3.1 g/dL (ref 1.5–4.5)
Glucose: 119 mg/dL — ABNORMAL HIGH (ref 70–99)
Potassium: 4.6 mmol/L (ref 3.5–5.2)
Sodium: 135 mmol/L (ref 134–144)
Total Protein: 7.5 g/dL (ref 6.0–8.5)
eGFR: 94 mL/min/{1.73_m2} (ref >59)

## 2022-01-03 LAB — LIPID PANEL WITH LDL/HDL RATIO
Cholesterol, Total: 216 mg/dL — ABNORMAL HIGH (ref 100–199)
HDL: 80 mg/dL (ref >39)
LDL Chol Calc (NIH): 123 mg/dL — ABNORMAL HIGH (ref 0–99)
LDL/HDL Ratio: 1.5 ratio (ref 0.0–3.2)
Triglycerides: 76 mg/dL (ref 0–149)
VLDL Cholesterol Cal: 13 mg/dL (ref 5–40)

## 2022-01-03 LAB — MICROALBUMIN, URINE: Microalbumin, Urine: 24.2 ug/mL

## 2022-01-03 LAB — HEMOGLOBIN A1C
Est. average glucose Bld gHb Est-mCnc: 123 mg/dL
Hgb A1c MFr Bld: 5.9 % — ABNORMAL HIGH (ref 4.8–5.6)

## 2022-01-12 ENCOUNTER — Other Ambulatory Visit: Payer: Self-pay | Admitting: Family Medicine

## 2022-01-12 DIAGNOSIS — G8929 Other chronic pain: Secondary | ICD-10-CM

## 2022-01-13 ENCOUNTER — Other Ambulatory Visit: Payer: Medicare HMO

## 2022-01-19 ENCOUNTER — Telehealth: Payer: Self-pay | Admitting: Family Medicine

## 2022-01-19 NOTE — Telephone Encounter (Signed)
Copied from [place] [id number]. Topic: General - Inquiry >> Jan 19, 2022 11:37 AM Penni Bombard wrote: Reason for CRM: Pt called asking if her records from [place] have came in yet.  It is a cardiology office.  [facility] [employer] .  There should be two sets of records.  CB@  [phone]

## 2022-01-20 ENCOUNTER — Encounter: Payer: Self-pay | Admitting: Family Medicine

## 2022-01-20 ENCOUNTER — Ambulatory Visit (INDEPENDENT_AMBULATORY_CARE_PROVIDER_SITE_OTHER): Payer: Medicare HMO | Admitting: Family Medicine

## 2022-01-20 VITALS — BP 120/64 | HR 72 | Ht 63.5 in | Wt 174.0 lb

## 2022-01-20 DIAGNOSIS — I517 Cardiomegaly: Secondary | ICD-10-CM | POA: Diagnosis not present

## 2022-01-20 NOTE — Progress Notes (Signed)
Ypertrophy of intra   Date:  01/20/2022   Name:  Sharon Kaufman   DOB:  01/29/1951   MRN:  [id number]   Chief Complaint: referral to cardiology  Heart Problem This is a chronic (hx of palpatations/currently stable and echo documented septal hypertrophy) problem. The current episode started more than 1 year ago. The problem has been unchanged. Pertinent negatives include no chest pain.    Lab Results  Component Value Date   NA 135 01/02/2022   K 4.6 01/02/2022   CO2 23 01/02/2022   GLUCOSE 119 (H) 01/02/2022   BUN 11 01/02/2022   CREATININE 0.68 01/02/2022   CALCIUM 9.7 01/02/2022   EGFR 94 01/02/2022   Lab Results  Component Value Date   CHOL 216 (H) 01/02/2022   HDL 80 01/02/2022   LDLCALC 123 (H) 01/02/2022   TRIG 76 01/02/2022   No results found for: "TSH" Lab Results  Component Value Date   HGBA1C 5.9 (H) 01/02/2022   No results found for: "WBC", "HGB", "HCT", "MCV", "PLT" Lab Results  Component Value Date   ALT 20 01/02/2022   AST 24 01/02/2022   ALKPHOS 87 01/02/2022   BILITOT <0.2 01/02/2022   No results found for: "25OHVITD2", "25OHVITD3", "VD25OH"   Review of Systems  Respiratory:  Negative for chest tightness and shortness of breath.   Cardiovascular:  Negative for chest pain and palpitations.       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   Statin intolerance 03/21/2021   Acquired trigger finger of right middle finger 03/10/2021   Ankylosis of ankle joint, left 03/10/2021   Cirrhosis (HCC) 07/25/2020   Bronchiectasis without complication (HCC) 07/03/2020    Deviated septum 03/22/2020   Allergic rhinitis due to pollen 03/22/2020   Overweight (BMI 25.0-29.9) 12/06/2019   Hypertrophy of inter-atrial septum 10/02/2019   History of cardiovascular stress test 10/02/2019   Diastolic dysfunction without heart failure 10/02/2019   Dyspareunia in female 08/02/2019   Cataract, nuclear sclerotic senile, right 07/24/2019   Fibromyalgia 07/11/2019   History of hepatitis C 07/11/2019   Rheumatoid factor positive 06/06/2019   Other insomnia 06/06/2019   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   Essential hypertension 06/06/2019   Diabetes type 2, controlled (HCC) 06/06/2019   Anxiety 06/06/2019   Age-related osteoporosis without current pathological fracture 01/03/2013   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/2012    Allergies  Allergen Reactions   Quetiapine Rash   Alendronate Other (See Comments)    dyspepsia Other reaction(s): Other dyspepsia   Atorvastatin Other (See Comments)    Other reaction(s): Other myalgia myalgia    Codeine Itching and Nausea Only   Pravastatin Other (See Comments)    myalgias   Hydrocodone-Acetaminophen     Past Surgical History:  Procedure Laterality Date   ANKLE FUSION Left 2009   APPENDECTOMY  2009   BREAST LUMPECTOMY Left 2008   ROTATOR CUFF REPAIR Left 2011   SPLENECTOMY  1973   TOTAL SHOULDER REPLACEMENT Right 2019    Social History   Tobacco Use  Smoking status: Former    Types: Cigarettes    Quit date: 2008    Years since quitting: 15.6   Smokeless tobacco: Never  Vaping Use   Vaping Use: Never used  Substance Use Topics   Alcohol use: Yes    Alcohol/week: 2.0 standard drinks of alcohol    Types: 2 Glasses of wine per week   Drug use: Never     Medication list has been reviewed and updated.  Current Meds  Medication Sig   Accu-Chek FastClix Lancets MISC 1 each by Other route daily.   ACCU-CHEK GUIDE test strip 1 each by  Other route daily.   amitriptyline (ELAVIL) 10 MG tablet Take 1 tablet (10 mg total) by mouth at bedtime. (Patient taking differently: Take 20 mg by mouth at bedtime.)   Ascorbic Acid (VITAMIN C) 1000 MG tablet Take 1,000 mg by mouth daily.   aspirin 81 MG EC tablet Take 81 mg by mouth daily.   CALCIUM-VITAMIN D PO Take 1 tablet by mouth daily.   Carboxymethylcellul-Glycerin (REFRESH RELIEVA OP) Apply 1 drop to eye daily.   cetirizine (ZYRTEC) 10 MG tablet Take 10 mg by mouth daily.   diclofenac (VOLTAREN) 75 MG EC tablet Take 75 mg by mouth 2 (two) times daily as needed.   ezetimibe (ZETIA) 10 MG tablet Take 1 tablet (10 mg total) by mouth daily.   glipiZIDE (GLUCOTROL XL) 2.5 MG 24 hr tablet Take 1 tablet (2.5 mg total) by mouth daily with breakfast.   lisinopril (ZESTRIL) 2.5 MG tablet Take 1 tablet (2.5 mg total) by mouth daily.   LYSINE PO Take 1 tablet by mouth daily.   MAGNESIUM PO Take by mouth at bedtime.   metFORMIN (GLUCOPHAGE-XR) 750 MG 24 hr tablet Take 2 tablets (1,500 mg total) by mouth daily with breakfast.   omeprazole (PRILOSEC) 20 MG capsule Take 20 mg by mouth daily.   zinc gluconate 50 MG tablet Take 50 mg by mouth daily.   zolpidem (AMBIEN) 5 MG tablet Take 5 mg by mouth at bedtime. Psych   [DISCONTINUED] FLUoxetine (PROZAC) 10 MG capsule Take 1 capsule (10 mg total) by mouth daily.       01/20/2022    2:13 PM 12/02/2021    1:41 PM 10/16/2021    4:04 PM 10/09/2021    3:01 PM  GAD 7 : Generalized Anxiety Score  Nervous, Anxious, on Edge 0 0 1 1  Control/stop worrying 0 $RemoveBe'1 3 3  'khqFNgZIe$ Worry too much - different things 0 $Remove'1 3 3  'mEPcWHL$ Trouble relaxing 0 0 1 1  Restless 0 0 0 0  Easily annoyed or irritable 0 0 3 3  Afraid - awful might happen 0 1 3   Total GAD 7 Score 0 3 14   Anxiety Difficulty Not difficult at all Not difficult at all Not difficult at all Not difficult at all       01/20/2022    2:13 PM 12/22/2021    2:56 PM 12/22/2021    2:49 PM  Depression screen PHQ 2/9   Decreased Interest 0 0 0  Down, Depressed, Hopeless 0 0 0  PHQ - 2 Score 0 0 0  Altered sleeping 0 0 0  Tired, decreased energy 0 3 3  Change in appetite 0 3 3  Feeling bad or failure about yourself  0 0 0  Trouble concentrating 0 0 0  Moving slowly or fidgety/restless 0 0 0  Suicidal thoughts 0 0 0  PHQ-9 Score 0  6 6  Difficult doing work/chores Not difficult at all Not difficult at all Not difficult at all    BP Readings from Last 3 Encounters:  01/20/22 120/64  01/02/22 124/84  12/02/21 122/76    Physical Exam Vitals reviewed.  HENT:     Right Ear: Tympanic membrane normal.     Left Ear: Tympanic membrane normal.  Neck:     Vascular: Normal carotid pulses. No carotid bruit, hepatojugular reflux or JVD.  Cardiovascular:     Rate and Rhythm: Normal rate.     Pulses: Normal pulses.     Heart sounds: Normal heart sounds, S1 normal and S2 normal. No murmur heard.    No systolic murmur is present.     No diastolic murmur is present.     No friction rub. No gallop. No S3 or S4 sounds.  Pulmonary:     Effort: No respiratory distress.     Breath sounds: No stridor. No wheezing, rhonchi or rales.  Chest:     Chest wall: No tenderness.  Abdominal:     Tenderness: There is no abdominal tenderness.  Musculoskeletal:     Cervical back: Normal range of motion.  Neurological:     Mental Status: She is alert.     Wt Readings from Last 3 Encounters:  01/20/22 174 lb (78.9 kg)  01/02/22 172 lb (78 kg)  12/02/21 172 lb 12.8 oz (78.4 kg)    BP 120/64   Pulse 72   Ht 5' 3.5" (1.613 m)   Wt 174 lb (78.9 kg)   BMI 30.34 kg/m   Assessment and Plan:  1. Hypertrophy of inter-atrial septum Chronic.  Persistent.  Stable.  Patient had an echo in 2020 which noted a sigmoid shaped septum with focal hypertrophy of the basal septum without LVOT obstruction the remainder of the walls was normal thickness.  There is some mention of mild pulmonic hypertension with regurgitation of a  mild nature mention of the mitral and tricuspid pulmonic but not of the aortic.  Patient would like to more information as to if this has gotten any worse and if she needs to have further evaluation and if possible whether it can be removed and I told her that this would need to be evaluated at the cardiac level.  Referral has been placed to cardiology for evaluation and explanation of this diagnosis. - Ambulatory referral to Cardiology    Otilio Miu, MD

## 2022-01-22 DIAGNOSIS — M79671 Pain in right foot: Secondary | ICD-10-CM | POA: Diagnosis not present

## 2022-01-22 DIAGNOSIS — M79672 Pain in left foot: Secondary | ICD-10-CM | POA: Diagnosis not present

## 2022-01-22 DIAGNOSIS — M2041 Other hammer toe(s) (acquired), right foot: Secondary | ICD-10-CM | POA: Diagnosis not present

## 2022-01-22 DIAGNOSIS — E119 Type 2 diabetes mellitus without complications: Secondary | ICD-10-CM | POA: Diagnosis not present

## 2022-01-22 DIAGNOSIS — M2042 Other hammer toe(s) (acquired), left foot: Secondary | ICD-10-CM | POA: Diagnosis not present

## 2022-01-24 ENCOUNTER — Other Ambulatory Visit: Payer: Self-pay | Admitting: Family Medicine

## 2022-01-24 DIAGNOSIS — E7800 Pure hypercholesterolemia, unspecified: Secondary | ICD-10-CM

## 2022-01-24 DIAGNOSIS — Z789 Other specified health status: Secondary | ICD-10-CM

## 2022-01-27 ENCOUNTER — Ambulatory Visit
Admission: RE | Admit: 2022-01-27 | Discharge: 2022-01-27 | Disposition: A | Discharge status: Home / Self Care | Payer: Medicare HMO | Source: Ambulatory Visit | Attending: Family Medicine | Admitting: Family Medicine

## 2022-01-27 DIAGNOSIS — Z1231 Encounter for screening mammogram for malignant neoplasm of breast: Secondary | ICD-10-CM | POA: Diagnosis not present

## 2022-01-27 HISTORY — DX: Malignant neoplasm of unspecified site of unspecified female breast: C50.919

## 2022-01-27 NOTE — Telephone Encounter (Signed)
Change of pharmacy  Requested Prescriptions  Pending Prescriptions Disp Refills  . ezetimibe (ZETIA) 10 MG tablet [Pharmacy Med Name: EZETIMIBE 10 MG TABLET] 30 tablet 0    Sig: TAKE 1 TABLET BY MOUTH EVERY DAY     Cardiovascular:  Antilipid - Sterol Transport Inhibitors Failed - 01/24/2022  9:16 AM      Failed - Lipid Panel in normal range within the last 12 months    Cholesterol, Total  Date Value Ref Range Status  01/02/2022 216 (H) 100 - 199 mg/dL Final   LDL Chol Calc (NIH)  Date Value Ref Range Status  01/02/2022 123 (H) 0 - 99 mg/dL Final   HDL  Date Value Ref Range Status  01/02/2022 80 >39 mg/dL Final   Triglycerides  Date Value Ref Range Status  01/02/2022 76 0 - 149 mg/dL Final         Passed - AST in normal range and within 360 days    AST  Date Value Ref Range Status  01/02/2022 24 0 - 40 IU/L Final         Passed - ALT in normal range and within 360 days    ALT  Date Value Ref Range Status  01/02/2022 20 0 - 32 IU/L Final         Passed - Patient is not pregnant      Passed - Valid encounter within last 12 months    Recent Outpatient Visits          1 week ago Hypertrophy of inter-atrial septum   [place] [facility] [place], [place], MD   3 weeks ago Controlled type 2 diabetes mellitus without complication, without long-term current use of insulin ([place])   [place] [facility] at [place], Deanna C, MD   1 month ago Osteoarthritis of right knee, unspecified osteoarthritis type   [place] [facility] at [place], Earley Abide, MD   3 months ago Peroneal tendinitis, right   [facility] at [place], Earley Abide, MD   3 months ago Peroneal tendinitis, right   [facility] at [facility], Earley Abide, MD      Future Appointments            In 2 weeks  Zigmund Daniel, Earley Abide, MD [facility] and [facility] Medicine at [facility], [facility]   In 1 month [place], Kathlene November, MD [place]. [place]   In 3 months Juline Patch, MD [facility] Primary Care and Sports Medicine at [facility], [facility]

## 2022-01-29 ENCOUNTER — Other Ambulatory Visit: Payer: Self-pay

## 2022-01-29 ENCOUNTER — Encounter: Payer: Self-pay | Admitting: Family Medicine

## 2022-01-29 DIAGNOSIS — R233 Spontaneous ecchymoses: Secondary | ICD-10-CM

## 2022-01-30 ENCOUNTER — Other Ambulatory Visit: Payer: Self-pay | Admitting: *Deleted

## 2022-01-30 ENCOUNTER — Inpatient Hospital Stay
Admission: RE | Admit: 2022-01-30 | Discharge: 2022-01-30 | Disposition: A | Discharge status: Home / Self Care | Payer: Self-pay | Source: Ambulatory Visit | Attending: *Deleted | Admitting: *Deleted

## 2022-01-30 DIAGNOSIS — Z1231 Encounter for screening mammogram for malignant neoplasm of breast: Secondary | ICD-10-CM

## 2022-02-02 ENCOUNTER — Ambulatory Visit
Admission: RE | Admit: 2022-02-02 | Discharge: 2022-02-02 | Disposition: A | Discharge status: Home / Self Care | Payer: Medicare HMO | Source: Ambulatory Visit | Attending: Family Medicine | Admitting: Family Medicine

## 2022-02-02 DIAGNOSIS — Z780 Asymptomatic menopausal state: Secondary | ICD-10-CM | POA: Insufficient documentation

## 2022-02-02 DIAGNOSIS — M8580 Other specified disorders of bone density and structure, unspecified site: Secondary | ICD-10-CM | POA: Insufficient documentation

## 2022-02-02 DIAGNOSIS — M85852 Other specified disorders of bone density and structure, left thigh: Secondary | ICD-10-CM | POA: Diagnosis not present

## 2022-02-02 DIAGNOSIS — E119 Type 2 diabetes mellitus without complications: Secondary | ICD-10-CM | POA: Insufficient documentation

## 2022-02-02 DIAGNOSIS — Z13820 Encounter for screening for osteoporosis: Secondary | ICD-10-CM | POA: Diagnosis not present

## 2022-02-02 DIAGNOSIS — Z853 Personal history of malignant neoplasm of breast: Secondary | ICD-10-CM | POA: Insufficient documentation

## 2022-02-02 DIAGNOSIS — R233 Spontaneous ecchymoses: Secondary | ICD-10-CM | POA: Diagnosis not present

## 2022-02-03 LAB — CBC WITH DIFFERENTIAL/PLATELET
Basophils Absolute: 0.1 10*3/uL (ref 0.0–0.2)
Basos: 1 %
EOS (ABSOLUTE): 0.1 10*3/uL (ref 0.0–0.4)
Eos: 1 %
Hematocrit: 36 % (ref 34.0–46.6)
Hemoglobin: 12.4 g/dL (ref 11.1–15.9)
Immature Grans (Abs): 0 10*3/uL (ref 0.0–0.1)
Immature Granulocytes: 0 %
Lymphocytes Absolute: 4.5 10*3/uL — ABNORMAL HIGH (ref 0.7–3.1)
Lymphs: 43 %
MCH: 30.3 pg (ref 26.6–33.0)
MCHC: 34.4 g/dL (ref 31.5–35.7)
MCV: 88 fL (ref 79–97)
Monocytes Absolute: 1.2 10*3/uL — ABNORMAL HIGH (ref 0.1–0.9)
Monocytes: 12 %
Neutrophils Absolute: 4.6 10*3/uL (ref 1.4–7.0)
Neutrophils: 43 %
Platelets: 362 10*3/uL (ref 150–450)
RBC: 4.09 x10E6/uL (ref 3.77–5.28)
RDW: 12.2 % (ref 11.7–15.4)
WBC: 10.4 10*3/uL (ref 3.4–10.8)

## 2022-02-04 DIAGNOSIS — M797 Fibromyalgia: Secondary | ICD-10-CM | POA: Diagnosis not present

## 2022-02-06 ENCOUNTER — Encounter: Payer: Self-pay | Admitting: Family Medicine

## 2022-02-09 ENCOUNTER — Other Ambulatory Visit: Payer: Self-pay | Admitting: Family Medicine

## 2022-02-09 DIAGNOSIS — M7918 Myalgia, other site: Secondary | ICD-10-CM

## 2022-02-10 ENCOUNTER — Inpatient Hospital Stay (INDEPENDENT_AMBULATORY_CARE_PROVIDER_SITE_OTHER): Payer: Medicare HMO | Admitting: Radiology

## 2022-02-10 ENCOUNTER — Telehealth: Payer: Self-pay

## 2022-02-10 ENCOUNTER — Encounter: Payer: Self-pay | Admitting: Family Medicine

## 2022-02-10 ENCOUNTER — Other Ambulatory Visit: Payer: Self-pay

## 2022-02-10 ENCOUNTER — Ambulatory Visit (INDEPENDENT_AMBULATORY_CARE_PROVIDER_SITE_OTHER): Payer: Medicare HMO | Admitting: Family Medicine

## 2022-02-10 VITALS — BP 128/88 | HR 74 | Ht 63.5 in | Wt 174.0 lb

## 2022-02-10 DIAGNOSIS — M1711 Unilateral primary osteoarthritis, right knee: Secondary | ICD-10-CM | POA: Diagnosis not present

## 2022-02-10 DIAGNOSIS — M65331 Trigger finger, right middle finger: Secondary | ICD-10-CM

## 2022-02-10 DIAGNOSIS — M65841 Other synovitis and tenosynovitis, right hand: Secondary | ICD-10-CM

## 2022-02-10 MED ORDER — ACETAMINOPHEN-CODEINE 300-30 MG PO TABS: 1.0000 | ORAL_TABLET | ORAL | 0 refills | Status: AC | PRN

## 2022-02-10 MED ORDER — DICLOFENAC SODIUM 1 % EX GEL: 2.0000 g | Freq: Four times a day (QID) | CUTANEOUS | 1 refills | Status: DC

## 2022-02-10 MED ORDER — TRIAMCINOLONE ACETONIDE 40 MG/ML IJ SUSP
40.0000 mg | Freq: Once | INTRAMUSCULAR | Status: AC
  Administered 2022-02-10: 40 mg via INTRAMUSCULAR

## 2022-02-10 NOTE — Telephone Encounter (Signed)
Called pt to set her up at the 9:00 slot today for the eval. Of her blood in stools. She did not answer, but I left a message stating this. I asked her to call back and schedule at that slot.

## 2022-02-10 NOTE — Telephone Encounter (Signed)
Called pt and left another message stating "I didn't realize you are seeing Dr. Zigmund Daniel today." She can be put on our schedule for tomorrow, but not for today. As we have been told, pt can't be seen by both PCP and sports medicine on the same day.

## 2022-02-10 NOTE — Telephone Encounter (Signed)
Requested medication (s) are due for refill today:   Yes  Requested medication (s) are on the active medication list:   Yes  Future visit scheduled:   Yes today (9/19) at 2:40 with Dr. Zigmund Daniel   Last ordered: 10/09/2021 #30, 2 refills  Returned because note on rx states pt is taking 20 mg instead of 10 mg.   Has appt today with Dr. Zigmund Daniel.   Clarification needed.   Requested Prescriptions  Pending Prescriptions Disp Refills   amitriptyline (ELAVIL) 10 MG tablet [Pharmacy Med Name: AMITRIPTYLINE HCL 10 MG TAB] 30 tablet 2    Sig: TAKE 1 TABLET BY MOUTH EVERYDAY AT BEDTIME     Psychiatry:  Antidepressants - Heterocyclics (TCAs) Passed - 02/09/2022  2:18 AM      Passed - Valid encounter within last 6 months    Recent Outpatient Visits           3 weeks ago Hypertrophy of inter-atrial septum   [place] [facility] at [facility], MD   1 month ago Controlled type 2 diabetes mellitus without complication, without long-term current use of insulin ([place])   [place] [facility] at [place], Deanna C, MD   2 months ago Osteoarthritis of right knee, unspecified osteoarthritis type   [place] [facility] at [place], Earley Abide, MD   3 months ago Peroneal tendinitis, right   [facility] and [facility] Medicine at [place], Earley Abide, MD   4 months ago Peroneal tendinitis, right   [facility] and [facility] at [facility], Earley Abide, MD       Future Appointments             Today Zigmund Daniel, Earley Abide, MD [facility] Care and Sports Medicine at [facility], [place]   In 1 month [place], Kathlene November, [place]. [place]   In 2 months Juline Patch, MD [facility] Primary Care and Sports Medicine at [facility], [facility]

## 2022-02-10 NOTE — Assessment & Plan Note (Addendum)
Right-hand-dominant patient presenting with acute on chronic right third digit trigger finger, states that she has had this issue in the past successfully addressed with corticosteroid injection.  Has been on oral diclofenac with persistent symptomatology, noting catching and near locking at the finger.  Examination reveals tender nodularity at the distal palmar crease, third digit volar, passive flexion extension with intermittent catching, no overt triggering, sensorimotor intact distally.  We reviewed treatment strategies and given her persistent symptoms despite diclofenac she did elect to proceed with ultrasound-guided corticosteroid injection at the right third digit, tolerated this well, post care addressed, can follow-up as needed for this issue.

## 2022-02-10 NOTE — Progress Notes (Signed)
Primary Care / Sports Medicine Office Visit  Patient Information:  Patient ID: Sharon Kaufman, female DOB: [birth year] Age: 71 y.o. MRN: [id number]   Sharon Kaufman is a pleasant 71 y.o. female presenting with the following:  Chief Complaint  Patient presents with   Osteoarthritis of right knee    Felling better, as long as she is taking diclofenac   Hand Pain    Trigger finger right middle, had injections a long time ago    Vitals:   02/10/22 1432  BP: 128/88  Pulse: 74  SpO2: 99%   Vitals:   02/10/22 1432  Weight: 174 lb (78.9 kg)  Height: 5' 3.5" (1.613 m)   Body mass index is 30.34 kg/m.     Independent interpretation of notes and tests performed by another provider:   None  Procedures performed:   Procedure:  Injection of right third digit flexor tendon/trigger finger under ultrasound guidance. Ultrasound guidance utilized for in-plane approach at the third digit volar aspect flexor tendon sheath, no discontinuity or irregularity in the tendon morphology noted Samsung HS60 device utilized with permanent recording / reporting. Verbal informed consent obtained and verified. Skin prepped in a sterile fashion. Ethyl chloride for topical local analgesia.  Completed without difficulty and tolerated well. Medication: triamcinolone acetonide 40 mg/mL suspension for injection 0.5 mL total and 0.5 mL lidocaine 1% without epinephrine utilized for needle placement anesthetic Advised to contact for fevers/chills, erythema, induration, drainage, or persistent bleeding.   Pertinent History, Exam, Impression, and Recommendations:   Problem List Items Addressed This Visit       Musculoskeletal and Integument   Osteoarthritis of right knee    Chronic condition, symptomatic without oral diclofenac which she doses roughly once daily.  Unfortunate, she has developed recent blood loss per rectum, has upcoming visit with PCP Dr. Otilio Miu to further evaluate this issue.   While she has not been symptomatic over the past few days, I did strongly caution against usage of oral NSAIDs until this issue has been fully addressed.  Over the interim plan for transition to topical diclofenac 1%, I did discuss that this medication can still be absorbed systemically, additionally as needed Tylenol 3 for recalcitrant symptoms.  Once cleared from a GI standpoint, if appropriate, can consider restart of oral diclofenac, additionally in 1 month time she would be due for corticosteroid injection, we could also consider viscosupplementation.      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  Has been on oral diclofenac with persistent symptomatology, noting catching and near locking at the finger.  Examination reveals tender nodularity at the distal palmar crease, third digit volar, passive flexion extension with intermittent catching, no overt triggering, sensorimotor intact distally.  We reviewed treatment strategies and given her persistent symptoms despite diclofenac she did elect to proceed with ultrasound-guided corticosteroid injection at the right third digit, tolerated this well, post care addressed, can follow-up as needed for this issue.      Relevant Medications   acetaminophen-codeine (TYLENOL #3) 300-30 MG tablet   Other Relevant Orders   [place] LIMITED JOINT SPACE STRUCTURES UP RIGHT     Orders & Medications Meds ordered this encounter  Medications   triamcinolone acetonide (KENALOG-40) injection 40 mg   diclofenac Sodium (VOLTAREN) 1 % GEL    Sig: Apply 2 g topically 4 (four) times daily. To affected joint.    Dispense:  100 g    Refill:  1    acetaminophen-codeine (TYLENOL #3) 300-30 MG tablet    Sig: Take 1 tablet by mouth every 4 (four) hours as needed for up to 5 days for moderate pain.    Dispense:  20 tablet    Refill:  0   Orders Placed This Encounter  Procedures   [place] LIMITED JOINT SPACE STRUCTURES UP RIGHT     No follow-ups on file.     Montel Culver, MD   Primary Care Sports Medicine [place]

## 2022-02-10 NOTE — Assessment & Plan Note (Signed)
Chronic condition, symptomatic without oral diclofenac which she doses roughly once daily.  Unfortunate, she has developed recent blood loss per rectum, has upcoming visit with PCP Dr. Otilio Miu to further evaluate this issue.  While she has not been symptomatic over the past few days, I did strongly caution against usage of oral NSAIDs until this issue has been fully addressed.  Over the interim plan for transition to topical diclofenac 1%, I did discuss that this medication can still be absorbed systemically, additionally as needed Tylenol 3 for recalcitrant symptoms.  Once cleared from a GI standpoint, if appropriate, can consider restart of oral diclofenac, additionally in 1 month time she would be due for corticosteroid injection, we could also consider viscosupplementation.

## 2022-02-11 ENCOUNTER — Encounter: Payer: Self-pay | Admitting: Pharmacist

## 2022-02-11 NOTE — Progress Notes (Signed)
[facility])                                            [place] Team                                        Statin Quality Measure Assessment    02/11/2022  JANEENE SAND [birth year] [id number]   Per review of chart and payor information, patient has a diagnosis of diabetes but is not currently filling a statin prescription.  This places patient into the SUPD (Statin Use In Patients with Diabetes) measure for CMS.    Patient has documented trials of atorvastatin and pravastatin with reported myalgias, but no corresponding CPT codes that would exclude patient from SUPD measure.  The 10-year ASCVD risk score (Arnett DK, et al., 2019) is: 19.6%   Values used to calculate the score:     Age: 25 years     Sex: Female     Is Non-Hispanic African American: No     Diabetic: Yes     Tobacco smoker: No     Systolic Blood Pressure: 202 mmHg     Is BP treated: Yes     HDL Cholesterol: 80 mg/dL     Total Cholesterol: 216 mg/dL 01/02/2022     Component Value Date/Time   CHOL 216 (H) 01/02/2022 1015   TRIG 76 01/02/2022 1015   HDL 80 01/02/2022 1015   LDLCALC 123 (H) 01/02/2022 1015    Please consider ONE of the following recommendations:  Initiate high intensity statin Atorvastatin '40mg'$  once daily, #90, 3 refills   Rosuvastatin '20mg'$  once daily, #90, 3 refills    Initiate moderate intensity          statin with reduced frequency if prior          statin intolerance 1x weekly, #13, 3 refills   2x weekly, #26, 3 refills   3x weekly, #39, 3 refills    Code for past statin intolerance or  other exclusions (required annually)  Provider Requirements: Assess prior statin intolerance and associate code during an office visit or audiovisual encounter  Drug Induced Myopathy G72.0   Myopathy, unspecified G72.9   Myositis, unspecified M60.9   Rhabdomyolysis M62.82   Cirrhosis of liver K74.69   Prediabetes R73.03   PCOS  E28.2  Please note that Z codes will not remove the patient from the quality measure  Loretha Brasil, PharmD [place] Pharmacist Office: [phone]

## 2022-02-13 ENCOUNTER — Encounter: Payer: Self-pay | Admitting: Family Medicine

## 2022-02-13 ENCOUNTER — Ambulatory Visit (INDEPENDENT_AMBULATORY_CARE_PROVIDER_SITE_OTHER): Payer: Medicare HMO | Admitting: Family Medicine

## 2022-02-13 ENCOUNTER — Other Ambulatory Visit
Admission: RE | Admit: 2022-02-13 | Discharge: 2022-02-13 | Disposition: A | Discharge status: Home / Self Care | Payer: Medicare HMO | Attending: Family Medicine | Admitting: Family Medicine

## 2022-02-13 VITALS — BP 120/80 | HR 76 | Ht 63.5 in | Wt 174.0 lb

## 2022-02-13 DIAGNOSIS — K625 Hemorrhage of anus and rectum: Secondary | ICD-10-CM | POA: Diagnosis not present

## 2022-02-13 DIAGNOSIS — K5790 Diverticulosis of intestine, part unspecified, without perforation or abscess without bleeding: Secondary | ICD-10-CM

## 2022-02-13 DIAGNOSIS — K7460 Unspecified cirrhosis of liver: Secondary | ICD-10-CM

## 2022-02-13 DIAGNOSIS — Z9081 Acquired absence of spleen: Secondary | ICD-10-CM

## 2022-02-13 DIAGNOSIS — K2931 Chronic superficial gastritis with bleeding: Secondary | ICD-10-CM

## 2022-02-13 DIAGNOSIS — Z8619 Personal history of other infectious and parasitic diseases: Secondary | ICD-10-CM

## 2022-02-13 DIAGNOSIS — K648 Other hemorrhoids: Secondary | ICD-10-CM

## 2022-02-13 DIAGNOSIS — K922 Gastrointestinal hemorrhage, unspecified: Secondary | ICD-10-CM

## 2022-02-13 DIAGNOSIS — K2930 Chronic superficial gastritis without bleeding: Secondary | ICD-10-CM | POA: Diagnosis not present

## 2022-02-13 LAB — CBC WITH DIFFERENTIAL/PLATELET
Abs Immature Granulocytes: 0.07 10*3/uL (ref 0.00–0.07)
Basophils Absolute: 0.1 10*3/uL (ref 0.0–0.1)
Basophils Relative: 1 %
Eosinophils Absolute: 0 10*3/uL (ref 0.0–0.5)
Eosinophils Relative: 0 %
HCT: 39.6 % (ref 36.0–46.0)
Hemoglobin: 13.3 g/dL (ref 12.0–15.0)
Immature Granulocytes: 1 %
Lymphocytes Relative: 31 %
Lymphs Abs: 3.6 10*3/uL (ref 0.7–4.0)
MCH: 30.6 pg (ref 26.0–34.0)
MCHC: 33.6 g/dL (ref 30.0–36.0)
MCV: 91.2 fL (ref 80.0–100.0)
Monocytes Absolute: 1.7 10*3/uL — ABNORMAL HIGH (ref 0.1–1.0)
Monocytes Relative: 15 %
Neutro Abs: 6 10*3/uL (ref 1.7–7.7)
Neutrophils Relative %: 52 %
Platelets: 376 10*3/uL (ref 150–400)
RBC: 4.34 MIL/uL (ref 3.87–5.11)
RDW: 14.4 % (ref 11.5–15.5)
WBC: 11.5 10*3/uL — ABNORMAL HIGH (ref 4.0–10.5)
nRBC: 0 % (ref 0.0–0.2)

## 2022-02-13 NOTE — Progress Notes (Signed)
Date:  02/13/2022   Name:  Sharon Kaufman   DOB:  Mar 11, 1951   MRN:  [id number]   Chief Complaint: Rectal Bleeding (Has not had any blood since last Saturday/6 days. Bleeding lasted 24 hours and then stopped.)  Rectal Bleeding  The current episode started 5 to 7 days ago. The onset was gradual. The problem occurs occasionally. The problem has been gradually improving. The pain is moderate. The stool is described as bloody. Associated symptoms include abdominal pain and hemorrhoids. Pertinent negatives include no diarrhea, no nausea, no rectal pain, no vomiting and no chest pain. Associated symptoms comments: diverticulitis.    Lab Results  Component Value Date   NA 135 01/02/2022   K 4.6 01/02/2022   CO2 23 01/02/2022   GLUCOSE 119 (H) 01/02/2022   BUN 11 01/02/2022   CREATININE 0.68 01/02/2022   CALCIUM 9.7 01/02/2022   EGFR 94 01/02/2022   Lab Results  Component Value Date   CHOL 216 (H) 01/02/2022   HDL 80 01/02/2022   LDLCALC 123 (H) 01/02/2022   TRIG 76 01/02/2022   No results found for: "TSH" Lab Results  Component Value Date   HGBA1C 5.9 (H) 01/02/2022   Lab Results  Component Value Date   WBC 11.5 (H) 02/13/2022   HGB 13.3 02/13/2022   HCT 39.6 02/13/2022   MCV 91.2 02/13/2022   PLT 376 02/13/2022   Lab Results  Component Value Date   ALT 20 01/02/2022   AST 24 01/02/2022   ALKPHOS 87 01/02/2022   BILITOT <0.2 01/02/2022   No results found for: "25OHVITD2", "25OHVITD3", "VD25OH"   Review of Systems  Respiratory:  Negative for shortness of breath and wheezing.   Cardiovascular:  Negative for chest pain, palpitations and leg swelling.  Gastrointestinal:  Positive for abdominal pain, blood in stool, hematochezia and hemorrhoids. Negative for abdominal distention, anal bleeding, constipation, diarrhea, nausea, rectal pain and vomiting.    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   Statin intolerance 03/21/2021   Acquired trigger finger of right middle finger 03/10/2021   Ankylosis of ankle joint, left 03/10/2021   Cirrhosis ([place]) 07/25/2020   Bronchiectasis without complication ([place]) 47/65/4650   Deviated septum 03/22/2020   Allergic rhinitis due to pollen 03/22/2020   Overweight (BMI 25.0-29.9) 12/06/2019   Hypertrophy of inter-atrial septum 10/02/2019   History of cardiovascular stress test 35/46/5681   Diastolic dysfunction without heart failure 10/02/2019   Dyspareunia in female 08/02/2019   Cataract, nuclear sclerotic senile, right 07/24/2019   Fibromyalgia 07/11/2019   History of hepatitis C 07/11/2019   Rheumatoid factor positive 06/06/2019   Other insomnia 06/06/2019   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   Essential hypertension 06/06/2019   Diabetes type 2, controlled ([place]) 06/06/2019   Anxiety 06/06/2019   Age-related osteoporosis without current pathological fracture 01/03/2013   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/2012    Allergies  Allergen Reactions   Quetiapine Rash   Alendronate Other (See Comments)    dyspepsia Other reaction(s): Other dyspepsia   Atorvastatin Other (  See Comments)    Other reaction(s): Other myalgia myalgia    Codeine Itching and Nausea Only   Pravastatin Other (See Comments)    myalgias   Hydrocodone-Acetaminophen     Past Surgical History:  Procedure Laterality Date   ANKLE FUSION Left 2009   APPENDECTOMY  2009   BREAST LUMPECTOMY Left 2008   REDUCTION MAMMAPLASTY Right 2014   ROTATOR CUFF REPAIR Left 2011    SPLENECTOMY  1973   TOTAL SHOULDER REPLACEMENT Right 2019    Social History   Tobacco Use   Smoking status: Former    Types: Cigarettes    Quit date: 2008    Years since quitting: 15.7   Smokeless tobacco: Never  Vaping Use   Vaping Use: Never used  Substance Use Topics   Alcohol use: Yes    Alcohol/week: 2.0 standard drinks of alcohol    Types: 2 Glasses of wine per week   Drug use: Never     Medication list has been reviewed and updated.  Current Meds  Medication Sig   Accu-Chek FastClix Lancets MISC 1 each by Other route daily.   ACCU-CHEK GUIDE test strip 1 each by Other route daily.   acetaminophen-codeine (TYLENOL #3) 300-30 MG tablet Take 1 tablet by mouth every 4 (four) hours as needed for up to 5 days for moderate pain.   Ascorbic Acid (VITAMIN C) 1000 MG tablet Take 1,000 mg by mouth daily.   aspirin 81 MG EC tablet Take 81 mg by mouth daily.   Berberine Chloride (BERBERINE HCI PO) Take by mouth.   CALCIUM-VITAMIN D PO Take 1 tablet by mouth daily.   Carboxymethylcellul-Glycerin (REFRESH RELIEVA OP) Apply 1 drop to eye daily.   cetirizine (ZYRTEC) 10 MG tablet Take 10 mg by mouth daily.   co-enzyme Q-10 30 MG capsule Take 30 mg by mouth 3 (three) times daily.   diclofenac (VOLTAREN) 75 MG EC tablet Take 75 mg by mouth 2 (two) times daily as needed.   diclofenac Sodium (VOLTAREN) 1 % GEL Apply 2 g topically 4 (four) times daily. To affected joint.   ezetimibe (ZETIA) 10 MG tablet TAKE 1 TABLET BY MOUTH EVERY DAY   glipiZIDE (GLUCOTROL XL) 2.5 MG 24 hr tablet Take 1 tablet (2.5 mg total) by mouth daily with breakfast.   lisinopril (ZESTRIL) 2.5 MG tablet Take 1 tablet (2.5 mg total) by mouth daily.   LYSINE PO Take 1 tablet by mouth daily.   MAGNESIUM PO Take by mouth at bedtime.   metFORMIN (GLUCOPHAGE-XR) 750 MG 24 hr tablet Take 2 tablets (1,500 mg total) by mouth daily with breakfast.   omeprazole (PRILOSEC) 20 MG capsule Take 20 mg by mouth daily.   zinc  gluconate 50 MG tablet Take 50 mg by mouth daily.   zolpidem (AMBIEN) 5 MG tablet Take 5 mg by mouth at bedtime. Psych       02/13/2022    3:36 PM 01/20/2022    2:13 PM 12/02/2021    1:41 PM 10/16/2021    4:04 PM  GAD 7 : Generalized Anxiety Score  Nervous, Anxious, on Edge 0 0 0 1  Control/stop worrying 0 0 1 3  Worry too much - different things 0 0 1 3  Trouble relaxing 0 0 0 1  Restless 0 0 0 0  Easily annoyed or irritable 0 0 0 3  Afraid - awful might happen 0 0 1 3  Total GAD 7 Score 0 0 3 14  Anxiety Difficulty  Not difficult at all Not difficult at all Not difficult at all Not difficult at all       02/13/2022    3:36 PM 01/20/2022    2:13 PM 12/22/2021    2:56 PM  Depression screen PHQ 2/9  Decreased Interest 0 0 0  Down, Depressed, Hopeless 0 0 0  PHQ - 2 Score 0 0 0  Altered sleeping 0 0 0  Tired, decreased energy 0 0 3  Change in appetite 0 0 3  Feeling bad or failure about yourself  0 0 0  Trouble concentrating 0 0 0  Moving slowly or fidgety/restless 0 0 0  Suicidal thoughts 0 0 0  PHQ-9 Score 0 0 6  Difficult doing work/chores Not difficult at all Not difficult at all Not difficult at all    BP Readings from Last 3 Encounters:  02/13/22 120/80  02/10/22 128/88  01/20/22 120/64    Physical Exam Vitals and nursing note reviewed. Exam conducted with a chaperone present.  Constitutional:      General: She is not in acute distress.    Appearance: She is not diaphoretic.  HENT:     Head: Normocephalic and atraumatic.     Right Ear: Tympanic membrane and external ear normal.     Left Ear: Tympanic membrane and external ear normal.     Nose: Nose normal.     Mouth/Throat:     Mouth: Mucous membranes are moist.  Eyes:     General:        Right eye: No discharge.        Left eye: No discharge.     Conjunctiva/sclera: Conjunctivae normal.     Pupils: Pupils are equal, round, and reactive to light.  Neck:     Thyroid: No thyromegaly.     Vascular: No JVD.   Cardiovascular:     Rate and Rhythm: Normal rate and regular rhythm.     Heart sounds: Normal heart sounds. No murmur heard.    No friction rub. No gallop.  Pulmonary:     Effort: Pulmonary effort is normal.     Breath sounds: Normal breath sounds.  Abdominal:     General: Abdomen is flat. Bowel sounds are normal.     Palpations: Abdomen is soft. There is no hepatomegaly or mass.     Tenderness: There is no abdominal tenderness. There is no guarding.  Genitourinary:    Rectum: Normal. Guaiac result negative. No mass or tenderness.  Musculoskeletal:        General: Normal range of motion.     Cervical back: Normal range of motion and neck supple.  Lymphadenopathy:     Cervical: No cervical adenopathy.  Skin:    General: Skin is warm and dry.  Neurological:     Mental Status: She is alert.     Wt Readings from Last 3 Encounters:  02/13/22 174 lb (78.9 kg)  02/10/22 174 lb (78.9 kg)  01/20/22 174 lb (78.9 kg)    BP 120/80   Pulse 76   Ht 5' 3.5" (1.613 m)   Wt 174 lb (78.9 kg)   BMI 30.34 kg/m   Assessment and Plan:  1. Lower GI bleed New onset about a week ago noted bright red blood per rectum with passing of clots.  This is gradually subsided and patient is also had improvement of the abdominal pain.  CBC was done and there is actually been improvement of the hemoglobin from study that was done  11 days ago.  At this point in time I feel that things are stable and we will await gastroenterology for an appointment for other reasons as seen below. - Ambulatory referral to Gastroenterology  2. Superficial gastritis without hemorrhage, unspecified chronicity In the past patient is noted to have had on endoscopy superficial erosions consistent with gastric ulcer.  Unremarkable pathology as best I can tell.  Patient used to take Pepcid but is not on anything now and pending evaluation we may resume famotidine.  3. Cirrhosis of liver without ascites, unspecified hepatic  cirrhosis type (HCC) Chronic.  Controlled.  Stable.  Elastography was last done in 2022 at [facility].  Patient has no change in her liver transaminase levels.  Since then.  We will proceed with referral to gastroenterology.  For consideration of endoscopy in 1 to 2 years for surveillance of esophageal varices. - Ambulatory referral to Gastroenterology  4. History of hepatitis C Patient has a history of hepatitis C for which he received course of Harvoni and was told that she was doing well with this and this has not been further evaluated. - Ambulatory referral to Gastroenterology  5. History of splenectomy Patient has a history of trauma which really removal of the spleen was necessary.  6. Diverticulosis On review of the patient's colonoscopy done in 2022 it was noted that diverticula were seen.  Patient currently has no tenderness on palpation and we will continue to watch this and this will be taken in consideration of bleeding should proceed. - Ambulatory referral to Gastroenterology  7. Internal hemorrhoids On review of patient's colonoscopy in 2022 was also noted that there were hemorrhoids and I am presuming that these might be internal since there were no external hemorrhoids noted on evaluation today.  We will for to gastroenterology and this may be further evaluated if bleeding was to proceed. - Ambulatory referral to Gastroenterology    Otilio Miu, MD

## 2022-03-05 ENCOUNTER — Ambulatory Visit: Payer: Medicare HMO | Admitting: Family Medicine

## 2022-03-09 ENCOUNTER — Other Ambulatory Visit: Payer: Self-pay | Admitting: Family Medicine

## 2022-03-09 ENCOUNTER — Encounter: Payer: Self-pay | Admitting: Family Medicine

## 2022-03-09 DIAGNOSIS — E119 Type 2 diabetes mellitus without complications: Secondary | ICD-10-CM

## 2022-03-11 ENCOUNTER — Encounter: Payer: Self-pay | Admitting: Family Medicine

## 2022-03-13 ENCOUNTER — Telehealth: Payer: Self-pay

## 2022-03-13 ENCOUNTER — Other Ambulatory Visit: Payer: Self-pay

## 2022-03-13 DIAGNOSIS — E119 Type 2 diabetes mellitus without complications: Secondary | ICD-10-CM

## 2022-03-13 NOTE — Telephone Encounter (Signed)
Called pt let her know that a referral for endo has been placed. Told her it could take 7-10 days for someone to schedule an appt. Told pt that they would be able to change any medication if needed. Told pt to have [employer]ME fax form to the endo office once she gets an appt. Pt abruptly got off the phone.  KP

## 2022-03-17 ENCOUNTER — Encounter: Payer: Self-pay | Admitting: Family Medicine

## 2022-03-17 ENCOUNTER — Ambulatory Visit (INDEPENDENT_AMBULATORY_CARE_PROVIDER_SITE_OTHER): Payer: Medicare HMO | Admitting: Family Medicine

## 2022-03-17 VITALS — BP 128/78 | HR 76 | Ht 63.0 in | Wt 177.0 lb

## 2022-03-17 DIAGNOSIS — M1711 Unilateral primary osteoarthritis, right knee: Secondary | ICD-10-CM

## 2022-03-17 DIAGNOSIS — M7051 Other bursitis of knee, right knee: Secondary | ICD-10-CM | POA: Diagnosis not present

## 2022-03-17 MED ORDER — DICLOFENAC SODIUM ER 100 MG PO TB24: 100.0000 mg | ORAL_TABLET | Freq: Every day | ORAL | 2 refills | Status: DC

## 2022-03-17 NOTE — Patient Instructions (Signed)
-   Start new diclofenac 100 mg once daily (take with food) - Discontinue any other NSAID - Return as scheduled

## 2022-03-17 NOTE — Assessment & Plan Note (Signed)
Patient returns for chronic right knee pain in the setting of osteoarthritis, did trial topical diclofenac which she found to be ineffective.  Was finding oral diclofenac 75 mg twice daily superior however would often forget second dose.  Examination reveals focal tenderness about the medial joint line, otherwise examination nonfocal.  Plan for transition from diclofenac 75 mg twice daily to diclofenac 100 mg extended release daily and close follow-up with low threshold to advance to Zilretta injection.

## 2022-03-17 NOTE — Assessment & Plan Note (Signed)
Patient also brings up right medial knee pain just inferior to her joint line, aggravated with weightbearing, associate with swelling.  Examination findings are most consistent with pes anserine bursitis, can be considered secondary/compensatory given ipsilateral right knee osteoarthritis related arthralgia.  From medication management standpoint she will be placed on diclofenac 100 mg extended release daily, if still symptomatic at her return, can advance to corticosteroid injection.

## 2022-03-17 NOTE — Progress Notes (Signed)
     Primary Care / Sports Medicine Office Visit  Patient Information:  Patient ID: Sharon Kaufman, female DOB: 09-12-1950 Age: 71 y.o. MRN: [id number]   Sharon Kaufman is a pleasant 71 y.o. female presenting with the following:  Chief Complaint  Patient presents with   Knee Pain    Right knee, states it never stopped hurting. The medication oral helped but topical did not.     Vitals:   03/17/22 1055  BP: 128/78  Pulse: 76  SpO2: 98%   Vitals:   03/17/22 1055  Weight: 177 lb (80.3 kg)  Height: '5\' 3"'$  (1.6 m)   Body mass index is 31.35 kg/m.  No results found.   Independent interpretation of notes and tests performed by another provider:   None  Procedures performed:   None  Pertinent History, Exam, Impression, and Recommendations:   Problem List Items Addressed This Visit       Musculoskeletal and Integument   Osteoarthritis of right knee - Primary    Patient returns for chronic right knee pain in the setting of osteoarthritis, did trial topical diclofenac which she found to be ineffective.  Was finding oral diclofenac 75 mg twice daily superior however would often forget second dose.  Examination reveals focal tenderness about the medial joint line, otherwise examination nonfocal.  Plan for transition from diclofenac 75 mg twice daily to diclofenac 100 mg extended release daily and close follow-up with low threshold to advance to Zilretta injection.      Relevant Medications   Diclofenac Sodium CR 100 MG 24 hr tablet   Pes anserinus bursitis of right knee    Patient also brings up right medial knee pain just inferior to her joint line, aggravated with weightbearing, associate with swelling.  Examination findings are most consistent with pes anserine bursitis, can be considered secondary/compensatory given ipsilateral right knee osteoarthritis related arthralgia.  From medication management standpoint she will be placed on diclofenac 100 mg extended release  daily, if still symptomatic at her return, can advance to corticosteroid injection.        Orders & Medications Meds ordered this encounter  Medications   Diclofenac Sodium CR 100 MG 24 hr tablet    Sig: Take 1 tablet (100 mg total) by mouth daily.    Dispense:  30 tablet    Refill:  2   No orders of the defined types were placed in this encounter.    Return in 9 days (on 03/26/2022).     Montel Culver, MD   Primary Care Sports Medicine [place]

## 2022-03-21 DIAGNOSIS — R931 Abnormal findings on diagnostic imaging of heart and coronary circulation: Secondary | ICD-10-CM | POA: Insufficient documentation

## 2022-03-21 NOTE — Progress Notes (Unsigned)
Cardiology Office Note  Date:  03/23/2022   ID:  Sharon, Kaufman [birth year], MRN [id number]  PCP:  Juline Patch, MD   Chief Complaint  Patient presents with   New Patient (Initial Visit)    Ref by Dr. Ronnald Ramp for abnormal Echo. Medications reviewed by the patient verbally.     HPI:  [name] is a 71 year old woman with past medical history of Diabetes type 2 Hyperlipidemia Fibromyalgia/chronic musculoskeletal pain Who presents by referral from Dr. Ronnald Ramp for abnormal echocardiogram performed at outside facility  Previously seen by cardiology in [place] in 2019 Outside echocardiogram reviewed Normal ejection fraction Basal septal hypertrophy of the ventricular wall, no outflow tract obstruction Diastolic dysfunction Mildly elevated right heart pressures  Reports tolerating Zetia Statin myalgias, tried several statins  Active, no regular exercise program  Father with ETOH, fatty liver Mother with HTN, RA, in her 66s  EKG personally reviewed by myself on todays visit Normal sinus rhythm with rate 82 bpm no significant ST-T wave changes  PMH:   has a past medical history of Allergy, Anxiety, Breast cancer ([place]), Breast cancer, left ([place]) (2008), Diabetes mellitus without complication ([place]), GERD (gastroesophageal reflux disease), Hyperlipidemia, Hypertension, and Personal history of radiation therapy (2008).  PSH:    Past Surgical History:  Procedure Laterality Date   ANKLE FUSION Left 2009   APPENDECTOMY  2009   BREAST LUMPECTOMY Left 2008   REDUCTION MAMMAPLASTY Right 2014   ROTATOR CUFF REPAIR Left 2011   SPLENECTOMY  1973   TOTAL SHOULDER REPLACEMENT Right 2019    Current Outpatient Medications  Medication Sig Dispense Refill   Accu-Chek FastClix Lancets MISC 1 each by Other route daily.     ACCU-CHEK GUIDE test strip 1 each by Other route daily. 100 each 1   Ascorbic Acid (VITAMIN C) 1000 MG tablet Take 1,000 mg by mouth daily.     aspirin 81  MG EC tablet Take 81 mg by mouth daily.     Berberine Chloride (BERBERINE HCI PO) Take by mouth.     Calcium Carb-Cholecalciferol 600-10 MG-MCG TABS Take by mouth.     CALCIUM-VITAMIN D PO Take 1 tablet by mouth daily.     Carboxymethylcellul-Glycerin (REFRESH RELIEVA OP) Apply 1 drop to eye daily.     cetirizine (ZYRTEC) 10 MG tablet Take 10 mg by mouth daily.     co-enzyme Q-10 30 MG capsule Take 30 mg by mouth 3 (three) times daily.     Diclofenac Sodium CR 100 MG 24 hr tablet Take 1 tablet (100 mg total) by mouth daily. 30 tablet 2   ezetimibe (ZETIA) 10 MG tablet TAKE 1 TABLET BY MOUTH EVERY DAY 30 tablet 0   furosemide (LASIX) 20 MG tablet Take 1 tablet (20 mg) by mouth once daily as needed for lower extremity swelling 30 tablet 1   glipiZIDE (GLUCOTROL XL) 2.5 MG 24 hr tablet Take 1 tablet (2.5 mg total) by mouth daily with breakfast. 90 tablet 1   lisinopril (ZESTRIL) 2.5 MG tablet Take 1 tablet (2.5 mg total) by mouth daily. 90 tablet 1   LYSINE PO Take 1 tablet by mouth daily.     MAGNESIUM PO Take by mouth at bedtime.     metFORMIN (GLUCOPHAGE-XR) 750 MG 24 hr tablet TAKE 2 TABLETS EVERY DAY WITH BREAKFAST 180 tablet 0   omeprazole (PRILOSEC) 20 MG capsule Take 20 mg by mouth daily.     potassium chloride (KLOR-CON) 10 MEQ tablet Take 1 tablet (  10 meq) by mouth once daily as needed when taking lasix (furosemide) 30 tablet 1   zinc gluconate 50 MG tablet Take 50 mg by mouth daily.     zolpidem (AMBIEN) 5 MG tablet Take 5 mg by mouth at bedtime. Psych     No current facility-administered medications for this visit.     Allergies:   Quetiapine, Alendronate, Atorvastatin, Codeine, Pravastatin, Risedronate, Hydrocodone-acetaminophen, and Amitriptyline   Social History:  The patient  reports that she quit smoking about 15 years ago. Her smoking use included cigarettes. She has never used smokeless tobacco. She reports current alcohol use of about 2.0 standard drinks of alcohol per  week. She reports that she does not use drugs.   Family History:   family history includes Alcohol abuse in her father; Liver disease in her father; Rheum arthritis in her mother.    Review of Systems: Review of Systems  Constitutional: Negative.   HENT: Negative.    Respiratory: Negative.    Cardiovascular: Negative.   Gastrointestinal: Negative.   Musculoskeletal: Negative.   Neurological: Negative.   Psychiatric/Behavioral: Negative.    All other systems reviewed and are negative.   PHYSICAL EXAM: VS:  BP 132/80 (BP Location: Left Arm, Patient Position: Sitting, Cuff Size: Normal)   Pulse 82   Ht '5\' 4"'$  (1.626 m)   Wt 176 lb 8 oz (80.1 kg)   SpO2 96%   BMI 30.30 kg/m  , BMI Body mass index is 30.3 kg/m. Constitutional:  oriented to person, place, and time. No distress.  HENT:  Head: Grossly normal Eyes:  no discharge. No scleral icterus.  Neck: No JVD, no carotid bruits  Cardiovascular: Regular rate and rhythm, no murmurs appreciated Pulmonary/Chest: Clear to auscultation bilaterally, no wheezes or rails Abdominal: Soft.  no distension.  no tenderness.  Musculoskeletal: Normal range of motion Neurological:  normal muscle tone. Coordination normal. No atrophy Skin: Skin warm and dry Psychiatric: normal affect, pleasant  Recent Labs: 01/02/2022: ALT 20; BUN 11; Creatinine, Ser 0.68; Potassium 4.6; Sodium 135 02/13/2022: Hemoglobin 13.3; Platelets 376    Lipid Panel Lab Results  Component Value Date   CHOL 216 (H) 01/02/2022   HDL 80 01/02/2022   LDLCALC 123 (H) 01/02/2022   TRIG 76 01/02/2022    Wt Readings from Last 3 Encounters:  03/23/22 176 lb 8 oz (80.1 kg)  03/17/22 177 lb (80.3 kg)  02/13/22 174 lb (78.9 kg)     ASSESSMENT AND PLAN:  Problem List Items Addressed This Visit       Cardiology Problems   Essential hypertension   Relevant Medications   furosemide (LASIX) 20 MG tablet   Other Relevant Orders   EKG 12-Lead     Other   Abnormal  echocardiogram   Relevant Orders   EKG 12-Lead   Statin intolerance   Relevant Orders   EKG 12-Lead   Fibromyalgia   Relevant Orders   EKG 12-Lead   Diabetes type 2, controlled ([place]) - Primary   Relevant Orders   EKG 12-Lead  Abnormal echocardiogram Report reviewed with her in detail Basal septal hypertrophy with no outflow tract obstruction a benign finding, no significant murmur on exam Diastolic dysfunction Would recommend aggressive blood pressure control Elevated right heart pressures She does report occasional lower extremity swelling, we have provided prescription for Lasix 20 mg with potassium 10 to take as needed  Diabetes type 2 A1c relatively well controlled On low-dose ACE inhibitor for renal protection  Hyperlipidemia Tolerating Zetia, did  not tolerate statin secondary to statin myalgia Discussed possible screening studies, CT coronary calcium scoring She will read about this and call us if she would like to order the imaging study For coronary calcification on scan could treat lipids more aggressively Additional options for cholesterol control include adding bempedoic acid with Zetia, alternatively could use PCSK9 inhibitor   Total encounter time more than 50 minutes  Greater than 50% was spent in counseling and coordination of care with the patient    Signed, Esmond Plants, M.D., Ph.D. [place], [place]

## 2022-03-23 ENCOUNTER — Encounter: Payer: Self-pay | Admitting: Cardiovascular Disease

## 2022-03-23 ENCOUNTER — Ambulatory Visit: Payer: Medicare HMO | Admitting: Gastroenterology

## 2022-03-23 ENCOUNTER — Ambulatory Visit: Payer: Medicare HMO | Attending: Cardiovascular Disease | Admitting: Cardiovascular Disease

## 2022-03-23 ENCOUNTER — Encounter: Payer: Self-pay | Admitting: Gastroenterology

## 2022-03-23 VITALS — BP 132/80 | HR 82 | Ht 64.0 in | Wt 176.5 lb

## 2022-03-23 VITALS — BP 141/82 | HR 72 | Temp 98.2°F | Ht 64.0 in | Wt 176.0 lb

## 2022-03-23 DIAGNOSIS — R931 Abnormal findings on diagnostic imaging of heart and coronary circulation: Secondary | ICD-10-CM | POA: Diagnosis not present

## 2022-03-23 DIAGNOSIS — Z789 Other specified health status: Secondary | ICD-10-CM

## 2022-03-23 DIAGNOSIS — M797 Fibromyalgia: Secondary | ICD-10-CM

## 2022-03-23 DIAGNOSIS — K7460 Unspecified cirrhosis of liver: Secondary | ICD-10-CM | POA: Diagnosis not present

## 2022-03-23 DIAGNOSIS — I10 Essential (primary) hypertension: Secondary | ICD-10-CM

## 2022-03-23 DIAGNOSIS — E119 Type 2 diabetes mellitus without complications: Secondary | ICD-10-CM

## 2022-03-23 MED ORDER — FUROSEMIDE 20 MG PO TABS: ORAL_TABLET | ORAL | 1 refills | Status: DC

## 2022-03-23 MED ORDER — POTASSIUM CHLORIDE ER 10 MEQ PO TBCR: EXTENDED_RELEASE_TABLET | ORAL | 1 refills | Status: DC

## 2022-03-23 NOTE — Patient Instructions (Addendum)
Info on CT coronary calcium score  Medication Instructions:  - Your physician has recommended you make the following change in your medication:   1) START Lasix (furosemide) 20 mg - take 1 tablet by mouth once daily as needed for leg swelling  2) START Potassium 10 meq - take 1 tablet by mouth once daily as needed when taking lasix (furosemide)  If you need a refill on your cardiac medications before your next appointment, please call your pharmacy.    Lab work: No new labs needed   Testing/Procedures: No new testing needed  1) Info on CT coronary calcium score  If you decide you would like to proceed with this then just call the office at [phone] or send a My Chart message and we will place the order for you  Then you would just follow the steps below: - $99 out of pocket cost at the time of your test - Call [phone] to schedule at your convenience.  Location: [place], [place] [address]  Coronary Calcium Scan A coronary calcium scan is an imaging test used to look for deposits of plaque in the inner lining of the blood vessels of the heart (coronary arteries). Plaque is made up of calcium, protein, and fatty substances. These deposits of plaque can partly clog and narrow the coronary arteries without producing any symptoms or warning signs. This puts a person at risk for a heart attack. A coronary calcium scan is performed using a computed tomography (CT) scanner machine without using a dye (contrast). This test is recommended for people who are at moderate risk for heart disease. The test can find plaque deposits before symptoms develop. Tell a health care provider about: Any allergies you have. All medicines you are taking, including vitamins, herbs, eye drops, creams, and over-the-counter medicines. Any problems you or family members have had with anesthetic medicines. Any bleeding problems you  have. Any surgeries you have had. Any medical conditions you have. Whether you are pregnant or may be pregnant. What are the risks? Generally, this is a safe procedure. However, problems may occur, including: Harm to a pregnant woman and her unborn baby. This test involves the use of radiation. Radiation exposure can be dangerous to a pregnant woman and her unborn baby. If you are pregnant or think you may be pregnant, you should not have this procedure done. A slight increase in the risk of cancer. This is because of the radiation involved in the test. The amount of radiation from one test is similar to the amount of radiation you are naturally exposed to over one year. What happens before the procedure? Ask your health care provider for any specific instructions on how to prepare for this procedure. You may be asked to avoid products that contain caffeine, tobacco, or nicotine for 4 hours before the procedure. What happens during the procedure?  You will undress and remove any jewelry from your neck or chest. You may need to remove hearing aides and dentures. Women may need to remove their bras. You will put on a hospital gown. Sticky electrodes will be placed on your chest. The electrodes will be connected to an electrocardiogram (ECG) machine to record a tracing of the electrical activity of your heart. You will lie down on your back on a curved bed that is attached to the [facility]. You may be given medicine to slow down your heart rate so that clear pictures can be  created. You will be moved into the CT scanner, and the CT scanner will take pictures of your heart. During this time, you will be asked to lie still and hold your breath for 10-20 seconds at a time while each picture of your heart is being taken. The procedure may vary among health care providers and hospitals. What can I expect after the procedure? You can return to your normal activities. It is up to you to get the results  of your procedure. Ask your health care provider, or the department that is doing the procedure, when your results will be ready. Summary A coronary calcium scan is an imaging test used to look for deposits of plaque in the inner lining of the blood vessels of the heart. Plaque is made up of calcium, protein, and fatty substances. A coronary calcium scan is performed using a CT scanner machine without contrast. Generally, this is a safe procedure. Tell your health care provider if you are pregnant or may be pregnant. Ask your health care provider for any specific instructions on how to prepare for this procedure. You can return to your normal activities after the scan is done. This information is not intended to replace advice given to you by your health care provider. Make sure you discuss any questions you have with your health care provider. Document Revised: 04/20/2021 Document Reviewed: 04/20/2021 Elsevier Patient Education  [place]: At [facility], you and your health needs are our priority.  As part of our continuing mission to provide you with exceptional heart care, we have created designated Provider Care Teams.  These Care Teams include your primary Cardiologist (physician) and Advanced Practice Providers (APPs -  Physician Assistants and Nurse Practitioners) who all work together to provide you with the care you need, when you need it.  You will need a follow up appointment as needed  Providers on your designated Care Team:   Murray Hodgkins, NP Christell Faith, PA-C Cadence Kathlen Mody, [place]  COVID-19 Vaccine Information can be found at: [email or web address].co.uk For questions related to vaccine distribution or appointments, please email [email or web address] or call [phone].     Furosemide Tablets What is this medication? FUROSEMIDE (fyoor [place] se mide) treats high blood pressure. It may also be used  to reduce swelling related to heart, kidney, or liver disease. It helps your kidneys remove more fluid and salt from your blood through the urine. It belongs to a group of medications called diuretics. This medicine may be used for other purposes; ask your health care provider or pharmacist if you have questions. COMMON BRAND NAME(S): Active-Medicated Specimen Kit, Delone, Diuscreen, Lasix, RX Specimen Collection Kit, Specimen Collection Kit, URINX Medicated Specimen Collection What should I tell my care team before I take this medication? They need to know if you have any of these conditions: Diarrhea or vomiting Gout Heart disease High or low levels of electrolytes, such as magnesium, potassium, or sodium in your blood Kidney disease, small amounts of urine, or difficulty passing urine Liver disease Thyroid disease An unusual or allergic reaction to furosemide, sulfa medications, other medications, foods, dyes, or preservatives Pregnant or trying to get pregnant Breast-feeding How should I use this medication? Take this medication by mouth. Take it as directed on the prescription label at the same time every day. You can take it with or without food. If it upsets your stomach, take it with food. Keep taking it unless your care team tells you to stop. Talk to  your care team about the use of this medication in children. Special care may be needed. Overdosage: If you think you have taken too much of this medicine contact a poison control center or emergency room at once. NOTE: This medicine is only for you. Do not share this medicine with others. What if I miss a dose? If you miss a dose, take it as soon as you can. If it is almost time for your next dose, take only that dose. Do not take double or extra doses. What may interact with this medication? Aspirin and aspirin-like medications Certain antibiotics Chloral  hydrate Cisplatin Cyclosporine Digoxin Diuretics Laxatives Lithium Medications for blood pressure Medications that relax muscles for surgery Methotrexate NSAIDs, medications for pain and inflammation, such as ibuprofen, naproxen, or indomethacin Phenytoin Steroid medications, such as prednisone or cortisone Sucralfate Thyroid hormones This list may not describe all possible interactions. Give your health care provider a list of all the medicines, herbs, non-prescription drugs, or dietary supplements you use. Also tell them if you smoke, drink alcohol, or use illegal drugs. Some items may interact with your medicine. What should I watch for while using this medication? Visit your care team for regular checks on your progress. Tell your care team if your symptoms do not start to get better or if they get worse. Check your blood pressure as directed. Know what your blood pressure should be and when to contact your care team. This medication may increase the amount of sugar in blood or urine. The risk may be higher in patients who already have diabetes. Ask your care team what you can do to lower your risk of diabetes while taking this medication. You may need to be on a special diet while taking this medication. Check with your care team. Also, ask how many glasses of fluid you need to drink a day. You must not get dehydrated. This medication may affect your coordination, reaction time, or judgment. Do not drive or operate machinery until you know how this medication affects you. Sit up or stand slowly to reduce the risk of dizzy or fainting spells. Drinking alcohol with this medication can increase the risk of these side effects. This medication can make you more sensitive to the sun. Keep out of the sun. If you cannot avoid being in the sun, wear protective clothing and use sunscreen. Do not use sun lamps or tanning beds/booths. Check with your care team if you have severe diarrhea, nausea, and  vomiting, or if you sweat a lot. The loss of too much body fluid may make it dangerous for you to take this medication. What side effects may I notice from receiving this medication? Side effects that you should report to your care team as soon as possible: Allergic reactions--skin rash, itching, hives, swelling of the face, lips, tongue, or throat Dehydration--increased thirst, dry mouth, feeling faint or lightheaded, headache, dark yellow or brown urine Hearing loss, ringing in ears High blood sugar (hyperglycemia)--increased thirst or amount of urine, unusual weakness or fatigue, blurry vision Low blood pressure--dizziness, feeling faint or lightheaded, blurry vision Low potassium level--muscle pain or cramps, unusual weakness or fatigue, fast or irregular heartbeat, constipation Side effects that usually do not require medical attention (report to your care team if they continue or are bothersome): Burning or tingling sensation in hands or feet Constipation Diarrhea Dizziness Headache This list may not describe all possible side effects. Call your doctor for medical advice about side effects. You may report side effects  to FDA at 1-800-FDA-1088. Where should I keep my medication? Keep out of the reach of children and pets. Store at room temperature between 20 and 25 degrees C (68 and 77 degrees F). Protect from light and moisture. Keep the container tightly closed. Throw away any unused medication after the expiration date. NOTE: This sheet is a summary. It may not cover all possible information. If you have questions about this medicine, talk to your doctor, pharmacist, or health care provider.  2023 Elsevier/Gold Standard (2020-08-16 00:00:00)

## 2022-03-23 NOTE — Progress Notes (Addendum)
Gastroenterology Consultation  Referring Provider:     Juline Patch, MD Primary Care Physician:  Juline Patch, MD Primary Gastroenterologist:  Dr. Allen Norris     Reason for Consultation:     Rectal bleeding        HPI:   Sharon Kaufman is a 71 y.o. y/o female referred for consultation & management of rectal bleeding by Dr. Juline Patch, MD. This patient comes to see me after being seen in the past by gastroenterology in [facility].  The patient had recently moved here and is establishing care with me today.  She has a history of hepatitis C and was treated with Harvoni.  Imaging has shown the patient to have cirrhosis of the liver.  Esophagogastroduodenoscopy (EGD) IMPRESSIONS: 1. The duodenal mucosa showed no abnormalities in the total duodenum  2. Two 1-94m erosions were found in the gastric body; biopsies were obtained  3. The stomach otherwise appeared normal  4. The mucosa of the esophagus appeared normal  5. Retroflexed views revealed no abnormalities.    Colonoscopy IMPRESSIONS: 1. Left sided diverticulosis was noted  2. The mucosa of the terminal ileum appeared normal  3. Normal tissue in the ascending colon on retroflexed view  4. Retroflexed views revealed small hemorrhoids. 5. The colon mucosa was otherwise normal   The patient had liver elastography and a CT scan of the abdomen back in January 2022 with the findings of cirrhosis.  The patient denies being told that she needs surveillance for hepatocellular carcinoma and has not had any imaging since January 2022.  Patient now reports that she comes in because of rectal bleeding with abdominal pain.  Patient reports that she had severe abdominal pain in the lower abdomen and had bloody diarrhea which she reports to be mostly blood without any stools.  The patient then states that the abdominal pain persisted for about 4 days after the rectal bleeding.  She has had no further episodes of rectal  bleeding.     Past Medical History:  Diagnosis Date   Allergy    Anxiety    [place]    [place]   Diabetes mellitus without complication [place]    GERD (gastroesophageal reflux disease)    Hyperlipidemia    Hypertension    Personal history of radiation therapy 2008   Left Breast Cancer    Past Surgical History:  Procedure Laterality Date   ANKLE FUSION Left 2009   APPENDECTOMY  2009   BREAST LUMPECTOMY Left 2008   REDUCTION MAMMAPLASTY Right 2014   ROTATOR CUFF REPAIR Left 2011   SPLENECTOMY  1973   TOTAL SHOULDER REPLACEMENT Right 2019    Prior to Admission medications   Medication Sig Start Date End Date Taking? Authorizing Provider  Accu-Chek FastClix Lancets MISC 1 each by Other route daily. 03/17/19   [provider]  ACCU-CHEK GUIDE test strip 1 each by Other route daily. 11/05/21   MMontel Culver MD  Ascorbic Acid (VITAMIN C) 1000 MG tablet Take 1,000 mg by mouth daily.    [provider]  aspirin 81 MG EC tablet Take 81 mg by mouth daily.    [provider]  Berberine Chloride (BERBERINE HCI PO) Take by mouth.    [provider]  Calcium Carb-Cholecalciferol 600-10 MG-MCG TABS Take by mouth.    [provider]  CALCIUM-VITAMIN D PO Take 1 tablet by mouth daily.    [provider]  Carboxymethylcellul-Glycerin (REFRESH RELIEVA OP) Apply 1 drop to eye daily.    [provider]  cetirizine (ZYRTEC) 10 MG tablet Take 10 mg by mouth daily.    [provider]  co-enzyme Q-10 30 MG capsule Take 30 mg by mouth 3 (three) times daily.    [provider]  Diclofenac Sodium CR 100 MG 24 hr tablet Take 1 tablet (100 mg total) by mouth daily. 03/17/22   Montel Culver, MD  ezetimibe (ZETIA) 10 MG tablet TAKE 1 TABLET BY MOUTH EVERY DAY 01/27/22   Juline Patch, MD  glipiZIDE (GLUCOTROL XL) 2.5 MG 24 hr tablet Take 1 tablet (2.5 mg total) by mouth daily with  breakfast. 01/02/22   Juline Patch, MD  lisinopril (ZESTRIL) 2.5 MG tablet Take 1 tablet (2.5 mg total) by mouth daily. 01/02/22   Juline Patch, MD  LYSINE PO Take 1 tablet by mouth daily.    [provider]  MAGNESIUM PO Take by mouth at bedtime.    [provider]  metFORMIN (GLUCOPHAGE-XR) 750 MG 24 hr tablet TAKE 2 TABLETS EVERY DAY WITH BREAKFAST 03/10/22   Juline Patch, MD  omeprazole (PRILOSEC) 20 MG capsule Take 20 mg by mouth daily. 01/06/21   [provider]  zinc gluconate 50 MG tablet Take 50 mg by mouth daily.    [provider]  zolpidem (AMBIEN) 5 MG tablet Take 5 mg by mouth at bedtime. Psych 12/29/21   [provider]    Family History  Problem Relation Age of Onset   Rheum arthritis Mother    Alcohol abuse Father    Liver disease Father      Social History   Tobacco Use   Smoking status: Former    Types: Cigarettes    Quit date: 2008    Years since quitting: 15.8   Smokeless tobacco: Never  Vaping Use   Vaping Use: Never used  Substance Use Topics   Alcohol use: Yes    Alcohol/week: 2.0 standard drinks of alcohol    Types: 2 Glasses of wine per week   Drug use: Never    Allergies as of 03/23/2022 - Review Complete 03/23/2022  Allergen Reaction Noted   Quetiapine Rash 03/22/2020   Alendronate Other (See Comments) 10/02/2019   Atorvastatin Other (See Comments) 03/08/2020   Codeine Itching and Nausea Only 05/11/2013   Pravastatin Other (See Comments) 09/19/2020   Risedronate Other (See Comments) 07/11/2019   Hydrocodone-acetaminophen  05/11/2013   Amitriptyline Rash 12/30/2021    Review of Systems:    All systems reviewed and negative except where noted in HPI.   Physical Exam:  There were no vitals taken for this visit. No LMP recorded. Patient is postmenopausal. General:   Alert,  Well-developed, well-nourished, pleasant and cooperative in NAD Head:  Normocephalic and atraumatic. Eyes:  Sclera  clear, no icterus.   Conjunctiva pink. Ears:  Normal auditory acuity. Neck:  Supple; no masses or thyromegaly. Lungs:  Respirations even and unlabored.  Clear throughout to auscultation.   No wheezes, crackles, or rhonchi. No acute distress. Heart:  Regular rate and rhythm; no murmurs, clicks, rubs, or gallops. Abdomen:  Normal bowel sounds.  No bruits.  Soft, non-tender and non-distended without masses, hepatosplenomegaly or hernias noted.  No guarding or rebound tenderness.  Negative Carnett sign.   Rectal:  Deferred.  Pulses:  Normal pulses noted. Extremities:  No clubbing or edema.  No cyanosis. Neurologic:  Alert and oriented x3;  grossly normal neurologically. Skin:  Intact without significant lesions or rashes.  No jaundice. Lymph Nodes:  No significant cervical adenopathy. Psych:  Alert and cooperative. Normal mood and affect.  Imaging Studies: No results found.  Assessment and Plan:   Sharon Kaufman is a 71 y.o. y/o female who comes in today with a history of cirrhosis and has not had any surveillance for hepatocellular carcinoma.  The patient will have a right upper quadrant ultrasound and a alpha-fetoprotein.  The patient did have a 40 x 27 mm cyst on the right lobe of the liver.  The patient's rectal bleeding with abdominal pain is consistent with ischemic colitis since the pain came with the bleeding and then took a few days to stop after the bleeding had ceased.  The patient has had a colonoscopy last year without any worrisome features.  The patient has been told to contact me if her bleeding or abdominal pain recur.  The patient has been explained the plan agrees with it.    Lucilla Lame, MD. Mar[facility] Regal    Note: This dictation was prepared with Dragon dictation along with smaller phrase technology. Any transcriptional errors that result from this process are unintentional.

## 2022-03-24 LAB — AFP TUMOR MARKER: AFP, Serum, Tumor Marker: 3 ng/mL (ref 0.0–9.2)

## 2022-03-26 ENCOUNTER — Ambulatory Visit: Payer: Medicare HMO | Admitting: Family Medicine

## 2022-03-26 NOTE — Telephone Encounter (Unsigned)
Copied from [place] [id number]. Topic: Appointment Scheduling - Scheduling Inquiry for Clinic >> Mar 26, 2022 12:18 PM Erskine Squibb wrote: Reason for CRM: The patient called in requesting to reschedule her appt this afternoon to the next available appt stating the provider told her the diabetic cortisone shots will not be available until next week. She wants to get both shots at one time instead of 2 different appointment. Please assist patient further

## 2022-03-27 ENCOUNTER — Encounter: Payer: Self-pay | Admitting: Gastroenterology

## 2022-03-27 DIAGNOSIS — G4700 Insomnia, unspecified: Secondary | ICD-10-CM | POA: Diagnosis not present

## 2022-03-27 DIAGNOSIS — F411 Generalized anxiety disorder: Secondary | ICD-10-CM | POA: Diagnosis not present

## 2022-03-30 ENCOUNTER — Ambulatory Visit
Admission: RE | Admit: 2022-03-30 | Discharge: 2022-03-30 | Disposition: A | Discharge status: Home / Self Care | Payer: Medicare HMO | Source: Ambulatory Visit | Attending: Gastroenterology | Admitting: Gastroenterology

## 2022-03-30 DIAGNOSIS — K7460 Unspecified cirrhosis of liver: Secondary | ICD-10-CM | POA: Insufficient documentation

## 2022-04-01 ENCOUNTER — Encounter: Payer: Self-pay | Admitting: Gastroenterology

## 2022-04-02 ENCOUNTER — Other Ambulatory Visit: Payer: Self-pay

## 2022-04-02 DIAGNOSIS — K7460 Unspecified cirrhosis of liver: Secondary | ICD-10-CM

## 2022-04-02 DIAGNOSIS — K769 Liver disease, unspecified: Secondary | ICD-10-CM

## 2022-04-07 ENCOUNTER — Ambulatory Visit: Payer: Medicare HMO | Admitting: Family Medicine

## 2022-04-10 ENCOUNTER — Inpatient Hospital Stay (INDEPENDENT_AMBULATORY_CARE_PROVIDER_SITE_OTHER): Payer: Medicare HMO | Admitting: Radiology

## 2022-04-10 ENCOUNTER — Ambulatory Visit (INDEPENDENT_AMBULATORY_CARE_PROVIDER_SITE_OTHER): Payer: Medicare HMO | Admitting: Family Medicine

## 2022-04-10 ENCOUNTER — Encounter: Payer: Self-pay | Admitting: Family Medicine

## 2022-04-10 VITALS — BP 126/80 | HR 78 | Ht 63.52 in | Wt 178.0 lb

## 2022-04-10 DIAGNOSIS — M1711 Unilateral primary osteoarthritis, right knee: Secondary | ICD-10-CM | POA: Diagnosis not present

## 2022-04-10 DIAGNOSIS — E785 Hyperlipidemia, unspecified: Secondary | ICD-10-CM | POA: Diagnosis not present

## 2022-04-10 DIAGNOSIS — M7051 Other bursitis of knee, right knee: Secondary | ICD-10-CM

## 2022-04-10 DIAGNOSIS — E1159 Type 2 diabetes mellitus with other circulatory complications: Secondary | ICD-10-CM | POA: Diagnosis not present

## 2022-04-10 DIAGNOSIS — M8580 Other specified disorders of bone density and structure, unspecified site: Secondary | ICD-10-CM | POA: Diagnosis not present

## 2022-04-10 DIAGNOSIS — R5383 Other fatigue: Secondary | ICD-10-CM | POA: Diagnosis not present

## 2022-04-10 DIAGNOSIS — E1169 Type 2 diabetes mellitus with other specified complication: Secondary | ICD-10-CM | POA: Diagnosis not present

## 2022-04-10 DIAGNOSIS — I152 Hypertension secondary to endocrine disorders: Secondary | ICD-10-CM | POA: Diagnosis not present

## 2022-04-10 MED ORDER — TRIAMCINOLONE ACETONIDE 40 MG/ML IJ SUSP
40.0000 mg | Freq: Once | INTRAMUSCULAR | Status: AC
  Administered 2022-04-10: 40 mg via INTRAMUSCULAR

## 2022-04-10 MED ORDER — TRIAMCINOLONE ACETONIDE 32 MG IX SRER
32.0000 mg | Freq: Once | INTRA_ARTICULAR | Status: AC
  Administered 2022-04-10: 32 mg via INTRA_ARTICULAR

## 2022-04-10 NOTE — Patient Instructions (Addendum)
You have just been given a cortisone injection to reduce pain and inflammation. After the injection you may notice immediate relief of pain as a result of the Lidocaine. It is important to rest the area of the injection for 24 to 48 hours after the injection. There is a possibility of some temporary increased discomfort and swelling for up to 72 hours until the cortisone begins to work. If you do have pain, simply rest the joint and use ice. If you can tolerate over the counter medications, you can try Tylenol, Aleve, or Advil for added relief per package instructions. - As above relative rest x 2 days - Transition to as-needed diclofenac - Start exercises next week - Follow-up as-needed

## 2022-04-10 NOTE — Assessment & Plan Note (Signed)
Patient returns for follow-up to right knee pain in the setting of osteoarthritis, has been dosing diclofenac 100 mg, still with pain.  Additional findings on exam today include tenderness at the pes anserine bursa.  Given treatments to date and residual features, patient did elect to proceed with ultrasound-guided right knee intra-articular Zilretta injection and right knee pes anserine bursa triamcinolone/cortisone injection.  Post care reviewed and I have advised her to transition to as needed diclofenac 100 mg, start home exercise next week, and follow-up as needed.

## 2022-04-10 NOTE — Progress Notes (Signed)
     Primary Care / Sports Medicine Office Visit  Patient Information:  Patient ID: Sharon Kaufman, female DOB: 12-Jun-1950 Age: 71 y.o. MRN: [id number]   Sharon Kaufman is a pleasant 71 y.o. female presenting with the following:  Chief Complaint  Patient presents with   Osteoarthritis of right knee    Zilretta-[id number] EXP-10/22/2022    Vitals:   04/10/22 1439  BP: 126/80  Pulse: 78  SpO2: 98%   Vitals:   04/10/22 1439  Weight: 178 lb (80.7 kg)  Height: 5' 3.52" (1.613 m)   Body mass index is 31.02 kg/m.     Independent interpretation of notes and tests performed by another provider:   None  Procedures performed:   Procedure:  Injection of right intra-articular knee under ultrasound guidance. Ultrasound guidance utilized for anteromedial approach, joint space visualized, no effusion Samsung HS60 device utilized with permanent recording / reporting. Verbal informed consent obtained and verified. Skin prepped in a sterile fashion. Ethyl chloride for topical local analgesia.  Completed without difficulty and tolerated well. Medication: Zilretta 32 mg/single-dose vial for injection 32 mg total  Advised to contact for fevers/chills, erythema, induration, drainage, or persistent bleeding.  Procedure:  Injection of right pes anserine bursa under ultrasound guidance. Ultrasound guidance utilized for out of plane approach to the right pes anserine bursa, hypoechoic region consistent with bursitis noted Samsung HS60 device utilized with permanent recording / reporting. Verbal informed consent obtained and verified. Skin prepped in a sterile fashion. Ethyl chloride for topical local analgesia.  Completed without difficulty and tolerated well. Medication: triamcinolone acetonide 40 mg/mL suspension for injection 1 mL total and 2 mL lidocaine 1% without epinephrine utilized for needle placement anesthetic Advised to contact for fevers/chills, erythema, induration, drainage,  or persistent bleeding.   Pertinent History, Exam, Impression, and Recommendations:   Problem List Items Addressed This Visit       Musculoskeletal and Integument   Osteoarthritis of right knee - Primary    Patient returns for follow-up to right knee pain in the setting of osteoarthritis, has been dosing diclofenac 100 mg, still with pain.  Additional findings on exam today include tenderness at the pes anserine bursa.  Given treatments to date and residual features, patient did elect to proceed with ultrasound-guided right knee intra-articular Zilretta injection and right knee pes anserine bursa triamcinolone/cortisone injection.  Post care reviewed and I have advised her to transition to as needed diclofenac 100 mg, start home exercise next week, and follow-up as needed.      Relevant Orders   [place] LIMITED JOINT SPACE STRUCTURES LOW RIGHT   Pes anserinus bursitis of right knee   Relevant Orders   [place] LIMITED JOINT SPACE STRUCTURES LOW RIGHT     Orders & Medications Meds ordered this encounter  Medications   Triamcinolone Acetonide (ZILRETTA) intra-articular injection 32 mg   triamcinolone acetonide (KENALOG-40) injection 40 mg   Orders Placed This Encounter  Procedures   [place] LIMITED JOINT SPACE STRUCTURES LOW RIGHT     Return if symptoms worsen or fail to improve.     Montel Culver, MD   Primary Care Sports Medicine [place]

## 2022-04-22 ENCOUNTER — Encounter: Payer: Self-pay | Admitting: Family Medicine

## 2022-04-22 NOTE — Telephone Encounter (Signed)
Please review.  KP

## 2022-04-23 ENCOUNTER — Other Ambulatory Visit: Payer: Self-pay

## 2022-04-23 ENCOUNTER — Other Ambulatory Visit: Payer: Self-pay | Admitting: Family Medicine

## 2022-04-23 DIAGNOSIS — G8929 Other chronic pain: Secondary | ICD-10-CM

## 2022-04-23 DIAGNOSIS — M1711 Unilateral primary osteoarthritis, right knee: Secondary | ICD-10-CM

## 2022-04-23 DIAGNOSIS — M7051 Other bursitis of knee, right knee: Secondary | ICD-10-CM

## 2022-04-23 DIAGNOSIS — M7918 Myalgia, other site: Secondary | ICD-10-CM

## 2022-04-23 MED ORDER — LIDOCAINE 5 % EX PTCH: 1.0000 | MEDICATED_PATCH | Freq: Two times a day (BID) | CUTANEOUS | 2 refills | Status: DC

## 2022-04-23 NOTE — Telephone Encounter (Signed)
Please call pt to schedule 2 month follow up with Dr. Zigmund Daniel.  KP

## 2022-04-24 ENCOUNTER — Telehealth: Payer: Self-pay | Admitting: Family Medicine

## 2022-04-24 ENCOUNTER — Other Ambulatory Visit: Payer: Self-pay | Admitting: Family Medicine

## 2022-04-24 DIAGNOSIS — M7051 Other bursitis of knee, right knee: Secondary | ICD-10-CM

## 2022-04-24 MED ORDER — LIDOCAINE 5 % EX PTCH: 1.0000 | MEDICATED_PATCH | Freq: Two times a day (BID) | CUTANEOUS | 2 refills | Status: DC

## 2022-04-24 NOTE — Telephone Encounter (Signed)
Resending to local pharmacy per pt request.   Requested Prescriptions  Pending Prescriptions Disp Refills   lidocaine (LIDODERM) 5 % 30 patch 2    Sig: Place 1 patch onto the skin every 12 (twelve) hours. Remove & Discard patch within 12 hours or as directed by MD     Analgesics:  Topicals Failed - 04/24/2022 10:20 AM      Failed - Manual Review: Labs are only required if the patient has taken medication for more than 8 weeks.      Passed - PLT in normal range and within 360 days    Platelets  Date Value Ref Range Status  02/13/2022 376 150 - 400 K/uL Final  02/02/2022 362 150 - 450 x10E3/uL Final         Passed - HGB in normal range and within 360 days    Hemoglobin  Date Value Ref Range Status  02/13/2022 13.3 12.0 - 15.0 g/dL Final  02/02/2022 12.4 11.1 - 15.9 g/dL Final         Passed - HCT in normal range and within 360 days    HCT  Date Value Ref Range Status  02/13/2022 39.6 36.0 - 46.0 % Final   Hematocrit  Date Value Ref Range Status  02/02/2022 36.0 34.0 - 46.6 % Final         Passed - Cr in normal range and within 360 days    Creatinine, Ser  Date Value Ref Range Status  01/02/2022 0.68 0.57 - 1.00 mg/dL Final         Passed - eGFR is 30 or above and within 360 days    eGFR  Date Value Ref Range Status  01/02/2022 94 >59 mL/min/1.73 Final         Passed - Patient is not pregnant      Passed - Valid encounter within last 12 months    Recent Outpatient Visits           2 weeks ago Osteoarthritis of right knee, unspecified osteoarthritis type   [facility] [place], Earley Abide, MD   1 month ago Osteoarthritis of right knee, unspecified osteoarthritis type   [facility] [place], Earley Abide, MD   2 months ago Lower GI bleed   [facility] [place], Deanna C, MD   2 months ago Stenosing tenosynovitis of  finger of right hand   [facility] [place], Earley Abide, MD   3 months ago Hypertrophy of inter-atrial septum   [facility] at [place], [place], MD       Future Appointments             In 1 week Juline Patch, MD [facility] Medicine at [facility], [facility]

## 2022-04-24 NOTE — Telephone Encounter (Signed)
Patient would like  lidocaine (LIDODERM) 5 % [place] [id number] [place] [address] Phone: [phone] Fax: [phone]    [place]. Please follow up with patient.

## 2022-04-24 NOTE — Telephone Encounter (Signed)
Copied from [place] [id number]. Topic: General - Other >> Apr 24, 2022  1:14 PM Oley Balm E wrote: Reason for CRM: Whoever answers can help, [employer] called to see if office received fax submissions this month   Initial Fax: dates nov 10th 17th and 24th   Best contact: [phone]  [phone]

## 2022-04-27 ENCOUNTER — Ambulatory Visit: Payer: Self-pay

## 2022-04-27 NOTE — Telephone Encounter (Signed)
    Chief Complaint: Right knee pain, had injection last month Symptoms: Pain, swelling Frequency: "Has been going on" Pertinent Negatives: Patient denies fever Disposition: '[]'$ ED /'[]'$ Urgent Care (no appt availability in office) / '[x]'$ Appointment(In office/virtual)/ '[]'$  [place] [facility]/ '[]'$ Home Care/ '[]'$ Refused Recommended Disposition /'[]'$ [place] [facility]/ '[]'$  Follow-up with PCP Additional Notes:   Reason for Disposition  [1] Very swollen joint AND [2] no fever  Answer Assessment - Initial Assessment Questions 1. LOCATION and RADIATION: "Where is the pain located?"      Right knee 2. QUALITY: "What does the pain feel like?"  (e.g., sharp, dull, aching, burning)     Sharp, throbbing 3. SEVERITY: "How bad is the pain?" "What does it keep you from doing?"   (Scale 1-10; or mild, moderate, severe)   -  MILD (1-3): doesn't interfere with normal activities    -  MODERATE (4-7): interferes with normal activities (e.g., work or school) or awakens from sleep, limping    -  SEVERE (8-10): excruciating pain, unable to do any normal activities, unable to walk     10 4. ONSET: "When did the pain start?" "Does it come and go, or is it there all the time?"     Been going on 5. RECURRENT: "Have you had this pain before?" If Yes, ask: "When, and what happened then?"     Yes 6. SETTING: "Has there been any recent work, exercise or other activity that involved that part of the body?"      No 7. AGGRAVATING FACTORS: "What makes the knee pain worse?" (e.g., walking, climbing stairs, running)     Walking 8. ASSOCIATED SYMPTOMS: "Is there any swelling or redness of the knee?"     Mild swelling 9. OTHER SYMPTOMS: "Do you have any other symptoms?" (e.g., chest pain, difficulty breathing, fever, calf pain)     Pain 10. PREGNANCY: "Is there any chance you are pregnant?" "When was your last menstrual period?"       No  Protocols used: Knee Pain-A-AH

## 2022-04-28 ENCOUNTER — Ambulatory Visit (INDEPENDENT_AMBULATORY_CARE_PROVIDER_SITE_OTHER): Payer: Medicare HMO | Admitting: Family Medicine

## 2022-04-28 VITALS — BP 130/80 | HR 62 | Ht 63.0 in | Wt 184.0 lb

## 2022-04-28 DIAGNOSIS — M7918 Myalgia, other site: Secondary | ICD-10-CM | POA: Diagnosis not present

## 2022-04-28 DIAGNOSIS — M1711 Unilateral primary osteoarthritis, right knee: Secondary | ICD-10-CM | POA: Diagnosis not present

## 2022-04-28 DIAGNOSIS — M25561 Pain in right knee: Secondary | ICD-10-CM

## 2022-04-28 DIAGNOSIS — G8929 Other chronic pain: Secondary | ICD-10-CM | POA: Diagnosis not present

## 2022-04-28 DIAGNOSIS — E559 Vitamin D deficiency, unspecified: Secondary | ICD-10-CM

## 2022-04-28 DIAGNOSIS — M84469A Pathological fracture, unspecified tibia and fibula, initial encounter for fracture: Secondary | ICD-10-CM | POA: Diagnosis not present

## 2022-04-28 DIAGNOSIS — R7989 Other specified abnormal findings of blood chemistry: Secondary | ICD-10-CM | POA: Diagnosis not present

## 2022-04-28 MED ORDER — ACETAMINOPHEN-CODEINE 300-60 MG PO TABS: 1.0000 | ORAL_TABLET | ORAL | 0 refills | Status: DC | PRN

## 2022-04-28 MED ORDER — AMITRIPTYLINE HCL 10 MG PO TABS: 10.0000 mg | ORAL_TABLET | Freq: Every day | ORAL | 2 refills | Status: DC

## 2022-04-29 ENCOUNTER — Encounter: Payer: Self-pay | Admitting: Family Medicine

## 2022-04-29 LAB — BASIC METABOLIC PANEL
BUN/Creatinine Ratio: 30 — ABNORMAL HIGH (ref 12–28)
BUN: 30 mg/dL — ABNORMAL HIGH (ref 8–27)
CO2: 17 mmol/L — ABNORMAL LOW (ref 20–29)
Calcium: 9.3 mg/dL (ref 8.7–10.3)
Chloride: 98 mmol/L (ref 96–106)
Creatinine, Ser: 0.99 mg/dL (ref 0.57–1.00)
Glucose: 138 mg/dL — ABNORMAL HIGH (ref 70–99)
Potassium: 5.5 mmol/L — ABNORMAL HIGH (ref 3.5–5.2)
Sodium: 131 mmol/L — ABNORMAL LOW (ref 134–144)
eGFR: 62 mL/min/{1.73_m2} (ref >59)

## 2022-04-29 LAB — VITAMIN D 25 HYDROXY (VIT D DEFICIENCY, FRACTURES): Vit D, 25-Hydroxy: 48.6 ng/mL (ref 30.0–100.0)

## 2022-04-29 NOTE — Assessment & Plan Note (Signed)
Patient received corticosteroid injections to the right knee and pes bursa, including Zilretta intra-articularly. She reports roughly 1 week of improvement during which time she had mild ramp up with activities (visiting family, etc). Has noted severe recurrence of pain, no radiation or swelling. Examination with tenderness minimal to joint line and bursa, noted at proximal medial tibia. Plan for MRI to assess bone edema, hinged knee brace, restart prior amitriptyline, and PRN Tylenol #4 given severity. Cane usage for additional modified weight bearing. Will await results of MRI to determine next steps.

## 2022-04-29 NOTE — Telephone Encounter (Signed)
Please advise 

## 2022-04-29 NOTE — Progress Notes (Signed)
     Primary Care / Sports Medicine Office Visit  Patient Information:  Patient ID: [place] Kaufman, female DOB: 23-Oct-1950 Age: 71 y.o. MRN: [id number]   Sharon Kaufman is a pleasant 71 y.o. female presenting with the following:  Chief Complaint  Patient presents with   Osteoarthritis of right knee    Pain response x 1 week    Vitals:   04/28/22 1317  BP: 130/80  Pulse: 62  SpO2: 98%   Vitals:   04/28/22 1317  Weight: 184 lb (83.5 kg)  Height: '5\' 3"'$  (1.6 m)   Body mass index is 32.59 kg/m.     Independent interpretation of notes and tests performed by another provider:   None  Procedures performed:   None  Pertinent History, Exam, Impression, and Recommendations:   Problem List Items Addressed This Visit       Musculoskeletal and Integument   Osteoarthritis of right knee - Primary    Patient received corticosteroid injections to the right knee and pes bursa, including Zilretta intra-articularly. She reports roughly 1 week of improvement during which time she had mild ramp up with activities (visiting family, etc). Has noted severe recurrence of pain, no radiation or swelling. Examination with tenderness minimal to joint line and bursa, noted at proximal medial tibia. Plan for MRI to assess bone edema, hinged knee brace, restart prior amitriptyline, and PRN Tylenol #4 given severity. Cane usage for additional modified weight bearing. Will await results of MRI to determine next steps.      Relevant Medications   acetaminophen-codeine (TYLENOL #4) 300-60 MG tablet   Other Relevant Orders   MR Knee Right Wo Contrast     Other   Chronic musculoskeletal pain   Relevant Medications   acetaminophen-codeine (TYLENOL #4) 300-60 MG tablet   amitriptyline (ELAVIL) 10 MG tablet   Other Visit Diagnoses     Chronic pain of right knee       Relevant Orders   MR Knee Right Wo Contrast   Insufficiency fracture of tibia, initial encounter       Relevant Orders   MR  Knee Right Wo Contrast   Basic metabolic panel   VITAMIN D 25 Hydroxy (Vit-D Deficiency, Fractures)   Vitamin D deficiency       Relevant Orders   Basic metabolic panel   VITAMIN D 25 Hydroxy (Vit-D Deficiency, Fractures)   Low serum vitamin D       Relevant Orders   VITAMIN D 25 Hydroxy (Vit-D Deficiency, Fractures)        Orders & Medications Meds ordered this encounter  Medications   acetaminophen-codeine (TYLENOL #4) 300-60 MG tablet    Sig: Take 1-2 tablets by mouth every 4 (four) hours as needed for moderate pain.    Dispense:  60 tablet    Refill:  0   amitriptyline (ELAVIL) 10 MG tablet    Sig: Take 1-2 tablets (10-20 mg total) by mouth at bedtime.    Dispense:  30 tablet    Refill:  2   Orders Placed This Encounter  Procedures   MR Knee Right Wo Contrast   Basic metabolic panel   VITAMIN D 25 Hydroxy (Vit-D Deficiency, Fractures)     No follow-ups on file.     Montel Culver, MD, [facility]   Primary Care Sports Medicine Primary Care and [facility] Medicine at [facility]

## 2022-04-29 NOTE — Patient Instructions (Addendum)
-   Obtain labs -  Use hinged knee brace while on your feet - Utilize cane for additional support - Coordinator will contact you to schedule MRI knee - Restart amitriptyline nightly (10-20 mg) - Can use Tylenol #4 every 4 hours as-needed - We will coordinate next steps pending MRI results

## 2022-05-01 ENCOUNTER — Telehealth: Payer: Self-pay | Admitting: Family Medicine

## 2022-05-01 ENCOUNTER — Other Ambulatory Visit: Payer: Self-pay | Admitting: Family Medicine

## 2022-05-01 MED ORDER — ACETAMINOPHEN-CODEINE 300-30 MG PO TABS: 1.0000 | ORAL_TABLET | ORAL | 0 refills | Status: DC | PRN

## 2022-05-01 NOTE — Telephone Encounter (Signed)
Pt called to report that the pharmacy does not have the tylenol #4 in stock, they want to know if PCP can send in a Rx for tylenol #3?

## 2022-05-05 ENCOUNTER — Ambulatory Visit: Payer: Medicare HMO | Admitting: Family Medicine

## 2022-05-07 ENCOUNTER — Encounter: Payer: Self-pay | Admitting: Family Medicine

## 2022-05-07 ENCOUNTER — Ambulatory Visit (INDEPENDENT_AMBULATORY_CARE_PROVIDER_SITE_OTHER): Payer: Medicare HMO | Admitting: Family Medicine

## 2022-05-07 ENCOUNTER — Ambulatory Visit
Admission: RE | Admit: 2022-05-07 | Discharge: 2022-05-07 | Disposition: A | Discharge status: Home / Self Care | Payer: Medicare HMO | Source: Ambulatory Visit | Attending: Family Medicine | Admitting: Family Medicine

## 2022-05-07 VITALS — BP 128/78 | HR 85 | Ht 64.0 in | Wt 182.0 lb

## 2022-05-07 DIAGNOSIS — M84469A Pathological fracture, unspecified tibia and fibula, initial encounter for fracture: Secondary | ICD-10-CM | POA: Diagnosis not present

## 2022-05-07 DIAGNOSIS — M1711 Unilateral primary osteoarthritis, right knee: Secondary | ICD-10-CM | POA: Insufficient documentation

## 2022-05-07 DIAGNOSIS — I10 Essential (primary) hypertension: Secondary | ICD-10-CM

## 2022-05-07 DIAGNOSIS — E7800 Pure hypercholesterolemia, unspecified: Secondary | ICD-10-CM | POA: Diagnosis not present

## 2022-05-07 DIAGNOSIS — Z789 Other specified health status: Secondary | ICD-10-CM

## 2022-05-07 DIAGNOSIS — G8929 Other chronic pain: Secondary | ICD-10-CM | POA: Insufficient documentation

## 2022-05-07 DIAGNOSIS — M25561 Pain in right knee: Secondary | ICD-10-CM | POA: Diagnosis not present

## 2022-05-07 DIAGNOSIS — S83241A Other tear of medial meniscus, current injury, right knee, initial encounter: Secondary | ICD-10-CM | POA: Diagnosis not present

## 2022-05-07 MED ORDER — LISINOPRIL 2.5 MG PO TABS: 2.5000 mg | ORAL_TABLET | Freq: Every day | ORAL | 1 refills | Status: DC

## 2022-05-07 MED ORDER — EZETIMIBE 10 MG PO TABS: 10.0000 mg | ORAL_TABLET | Freq: Every day | ORAL | 1 refills | Status: DC

## 2022-05-07 NOTE — Progress Notes (Signed)
Date:  05/07/2022   Name:  Sharon Kaufman   DOB:  [birth year]   MRN:  [id number]   Chief Complaint: Hyperlipidemia and Hypertension  Hyperlipidemia This is a chronic problem. The current episode started more than 1 year ago. The problem is controlled. Recent lipid tests were reviewed and are normal. She has no history of chronic renal disease, diabetes, hypothyroidism, liver disease, obesity or nephrotic syndrome. There are no known factors aggravating her hyperlipidemia. Pertinent negatives include no chest pain, focal sensory loss, focal weakness, leg pain, myalgias or shortness of breath. She is currently on no antihyperlipidemic treatment. The current treatment provides moderate improvement of lipids. There are no compliance problems.  Risk factors for coronary artery disease include dyslipidemia and hypertension.  Hypertension This is a chronic problem. The current episode started more than 1 year ago. The problem has been gradually improving since onset. The problem is controlled. Pertinent negatives include no blurred vision, chest pain, orthopnea, palpitations, PND or shortness of breath. There are no associated agents to hypertension. Risk factors for coronary artery disease include dyslipidemia. Past treatments include ACE inhibitors. The current treatment provides mild improvement. There is no history of chronic renal disease.    Lab Results  Component Value Date   NA 131 (L) 04/28/2022   K 5.5 (H) 04/28/2022   CO2 17 (L) 04/28/2022   GLUCOSE 138 (H) 04/28/2022   BUN 30 (H) 04/28/2022   CREATININE 0.99 04/28/2022   CALCIUM 9.3 04/28/2022   EGFR 62 04/28/2022   Lab Results  Component Value Date   CHOL 216 (H) 01/02/2022   HDL 80 01/02/2022   LDLCALC 123 (H) 01/02/2022   TRIG 76 01/02/2022   No results found for: "TSH" Lab Results  Component Value Date   HGBA1C 5.9 (H) 01/02/2022   Lab Results  Component Value Date   WBC 11.5 (H) 02/13/2022   HGB 13.3 02/13/2022    HCT 39.6 02/13/2022   MCV 91.2 02/13/2022   PLT 376 02/13/2022   Lab Results  Component Value Date   ALT 20 01/02/2022   AST 24 01/02/2022   ALKPHOS 87 01/02/2022   BILITOT <0.2 01/02/2022   Lab Results  Component Value Date   VD25OH 48.6 04/28/2022     Review of Systems  HENT:  Negative for trouble swallowing.   Eyes:  Negative for blurred vision and visual disturbance.  Respiratory:  Positive for choking. Negative for cough, chest tightness, shortness of breath, wheezing and stridor.   Cardiovascular:  Negative for chest pain, palpitations, orthopnea, leg swelling and PND.  Musculoskeletal:  Negative for myalgias.  Neurological:  Negative for focal weakness.    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   Statin intolerance 03/21/2021   Acquired trigger finger of right middle finger 03/10/2021   Ankylosis of ankle joint, left 03/10/2021   Cirrhosis ([place]) 07/25/2020   Bronchiectasis without complication ([place]) 18/56/3149   Deviated septum 03/22/2020   Allergic rhinitis due to pollen 03/22/2020   Overweight (BMI 25.0-29.9) 12/06/2019  History of cardiovascular stress test 67/04/4579   Diastolic dysfunction without heart failure 10/02/2019   Dyspareunia in female 08/02/2019   Cataract, nuclear sclerotic senile, right 07/24/2019   Fibromyalgia 07/11/2019   History of hepatitis C 07/11/2019   Rheumatoid factor positive 06/06/2019   Other insomnia 06/06/2019   History of splenectomy  06/06/2019   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   Essential hypertension 06/06/2019   Diabetes type 2, controlled ([place]) 06/06/2019   Anxiety 06/06/2019   Age-related osteoporosis without current pathological fracture 01/03/2013   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/2012    Allergies  Allergen Reactions   Quetiapine Rash   Alendronate Other (See Comments)    dyspepsia Other reaction(s): Other dyspepsia dyspepsia Other reaction(s): Other dyspepsia  Other reaction(s): Other dyspepsia  dyspepsia   Atorvastatin Other (See Comments)    Other reaction(s): Other myalgia myalgia  Other reaction(s): Other myalgia myalgia  Other reaction(s): Other myalgia  myalgia   Codeine Itching and Nausea Only   Pravastatin Other (See Comments)    myalgias   Risedronate Other (See Comments)    dyspepsia   Hydrocodone-Acetaminophen    Amitriptyline Rash    Past Surgical History:  Procedure Laterality Date   ANKLE FUSION Left 2009   APPENDECTOMY  2009   BREAST LUMPECTOMY Left 2008   REDUCTION MAMMAPLASTY Right 2014   ROTATOR CUFF REPAIR Left 2011   SPLENECTOMY  1973   TOTAL SHOULDER REPLACEMENT Right 2019    Social History   Tobacco Use   Smoking status: Former    Types: Cigarettes    Quit date: 2008    Years since quitting: 15.9   Smokeless tobacco: Never  Vaping Use   Vaping Use: Never used  Substance Use Topics   Alcohol use: Yes    Alcohol/week: 2.0 standard drinks of alcohol    Types: 2 Glasses of wine per week   Drug use: Never     Medication list has been reviewed and updated.  Current Meds  Medication Sig   Accu-Chek FastClix Lancets MISC 1 each by Other route daily.   ACCU-CHEK GUIDE test strip 1 each by Other route daily.   acetaminophen-codeine (TYLENOL #3) 300-30 MG tablet Take 1-2 tablets by mouth every 4 (four) hours as needed for moderate pain.   amitriptyline (ELAVIL) 10 MG tablet Take 1-2 tablets (10-20 mg total) by mouth  at bedtime.   Ascorbic Acid (VITAMIN C) 1000 MG tablet Take 1,000 mg by mouth daily.   aspirin 81 MG EC tablet Take 81 mg by mouth daily.   Berberine Chloride (BERBERINE HCI PO) Take by mouth.   Calcium Carb-Cholecalciferol 600-10 MG-MCG TABS Take by mouth.   CALCIUM-VITAMIN D PO Take 1 tablet by mouth daily.   Carboxymethylcellul-Glycerin (REFRESH RELIEVA OP) Apply 1 drop to eye daily.   cetirizine (ZYRTEC) 10 MG tablet Take 10 mg by mouth daily.   co-enzyme Q-10 30 MG capsule Take 30 mg by mouth 3 (three) times daily.   Diclofenac Sodium CR 100 MG 24 hr tablet Take 1 tablet (100 mg total) by mouth daily.   ezetimibe (ZETIA) 10 MG tablet TAKE 1 TABLET BY MOUTH EVERY DAY   furosemide (LASIX) 20 MG tablet Take 1 tablet (20 mg) by mouth once daily as needed for lower extremity swelling   glipiZIDE (GLUCOTROL XL) 2.5 MG 24 hr tablet Take 1 tablet (2.5 mg total) by mouth daily with breakfast.   lidocaine (LIDODERM) 5 % Place 1 patch onto the  skin every 12 (twelve) hours. Remove & Discard patch within 12 hours or as directed by MD   lisinopril (ZESTRIL) 2.5 MG tablet Take 1 tablet (2.5 mg total) by mouth daily.   LYSINE PO Take 1 tablet by mouth daily.   MAGNESIUM PO Take by mouth at bedtime.   metFORMIN (GLUCOPHAGE-XR) 750 MG 24 hr tablet TAKE 2 TABLETS EVERY DAY WITH BREAKFAST   omeprazole (PRILOSEC) 20 MG capsule Take 20 mg by mouth daily.   zolpidem (AMBIEN) 5 MG tablet Take 5 mg by mouth at bedtime. Psych       05/07/2022    3:20 PM 02/13/2022    3:36 PM 01/20/2022    2:13 PM 12/02/2021    1:41 PM  GAD 7 : Generalized Anxiety Score  Nervous, Anxious, on Edge 0 0 0 0  Control/stop worrying 0 0 0 1  Worry too much - different things 0 0 0 1  Trouble relaxing 0 0 0 0  Restless 0 0 0 0  Easily annoyed or irritable 0 0 0 0  Afraid - awful might happen 0 0 0 1  Total GAD 7 Score 0 0 0 3  Anxiety Difficulty Not difficult at all Not difficult at all Not difficult at all Not difficult at all        05/07/2022    3:20 PM 02/13/2022    3:36 PM 01/20/2022    2:13 PM  Depression screen PHQ 2/9  Decreased Interest 0 0 0  Down, Depressed, Hopeless 0 0 0  PHQ - 2 Score 0 0 0  Altered sleeping 0 0 0  Tired, decreased energy 0 0 0  Change in appetite 0 0 0  Feeling bad or failure about yourself  0 0 0  Trouble concentrating 0 0 0  Moving slowly or fidgety/restless 0 0 0  Suicidal thoughts 0 0 0  PHQ-9 Score 0 0 0  Difficult doing work/chores Not difficult at all Not difficult at all Not difficult at all    BP Readings from Last 3 Encounters:  05/07/22 128/78  04/28/22 130/80  04/10/22 126/80    Physical Exam Vitals reviewed.  HENT:     Mouth/Throat:     Mouth: Mucous membranes are moist.     Pharynx: No oropharyngeal exudate or posterior oropharyngeal erythema.  Cardiovascular:     Heart sounds: No murmur heard.    No friction rub. No gallop.  Pulmonary:     Breath sounds: No wheezing, rhonchi or rales.  Abdominal:     Palpations: There is no hepatomegaly or splenomegaly.     Tenderness: There is no abdominal tenderness.  Musculoskeletal:     Cervical back: Neck supple.     Wt Readings from Last 3 Encounters:  05/07/22 182 lb (82.6 kg)  04/28/22 184 lb (83.5 kg)  04/10/22 178 lb (80.7 kg)    BP 128/78   Pulse 85   Ht _0  (1.626 m)   Wt 182 lb (82.6 kg)   SpO2 98%   BMI 31.24 kg/m   Assessment and Plan:  1. Essential hypertension Chronic.  Controlled.  Stable.  Blood pressure 128/78.  Asymptomatic.  Tolerating medications well.  Continue lisinopril 2.5 mg once a day.  Will recheck in 6 months. - lisinopril (ZESTRIL) 2.5 MG tablet; Take 1 tablet (2.5 mg total) by mouth daily.  Dispense: 90 tablet; Refill: 1  2. Hypercholesterolemia Chronic.  Stable.  Continue Zetia 10 mg once a day.  Will recheck lipid panel  in 6 months. - ezetimibe (ZETIA) 10 MG tablet; Take 1 tablet (10 mg total) by mouth daily.  Dispense: 90 tablet; Refill: 1  3. Statin  intolerance Unable to tolerate statin due to myalgias. - ezetimibe (ZETIA) 10 MG tablet; Take 1 tablet (10 mg total) by mouth daily.  Dispense: 90 tablet; Refill: 1    Otilio Miu, MD

## 2022-05-08 ENCOUNTER — Other Ambulatory Visit: Payer: Self-pay | Admitting: Family Medicine

## 2022-05-08 ENCOUNTER — Other Ambulatory Visit: Payer: Self-pay | Admitting: Cardiovascular Disease

## 2022-05-08 NOTE — Telephone Encounter (Signed)
Requested Prescriptions  Pending Prescriptions Disp Refills   ACCU-CHEK GUIDE test strip [Pharmacy Med Name: ACCU-CHEK GUIDE TEST STRIP] 100 strip 1    Sig: USE 1 DAILY.     Endocrinology: Diabetes - Testing Supplies Passed - 05/08/2022 12:48 AM      Passed - Valid encounter within last 12 months    Recent Outpatient Visits           Yesterday Essential hypertension   [facility] [place], [place], MD   1 week ago Osteoarthritis of right knee, unspecified osteoarthritis type   [facility] [place], [place], MD   4 weeks ago Osteoarthritis of right knee, unspecified osteoarthritis type   [facility] at [place], [place], MD   1 month ago Osteoarthritis of right knee, unspecified osteoarthritis type   [facility] at [place], [place], MD   2 months ago Lower GI bleed   [facility] at [place], [place], MD       Future Appointments             In 6 months Juline Patch, MD [place] at [facility], [facility]

## 2022-05-11 ENCOUNTER — Other Ambulatory Visit: Payer: Self-pay | Admitting: Family Medicine

## 2022-05-11 ENCOUNTER — Encounter: Payer: Self-pay | Admitting: Family Medicine

## 2022-05-12 NOTE — Telephone Encounter (Signed)
Please advise 

## 2022-05-13 ENCOUNTER — Ambulatory Visit: Payer: Medicare HMO

## 2022-05-13 ENCOUNTER — Other Ambulatory Visit: Payer: Self-pay | Admitting: Family Medicine

## 2022-05-13 MED ORDER — VITAMIN D (ERGOCALCIFEROL) 1.25 MG (50000 UNIT) PO CAPS: 50000.0000 [IU] | ORAL_CAPSULE | ORAL | 0 refills | Status: DC

## 2022-05-13 MED ORDER — ALENDRONATE SODIUM 70 MG PO TABS: 70.0000 mg | ORAL_TABLET | ORAL | 2 refills | Status: DC

## 2022-05-13 MED ORDER — CODEINE SULFATE 30 MG PO TABS: 30.0000 mg | ORAL_TABLET | Freq: Four times a day (QID) | ORAL | 0 refills | Status: DC | PRN

## 2022-05-18 ENCOUNTER — Other Ambulatory Visit: Payer: Self-pay | Admitting: Family Medicine

## 2022-05-18 DIAGNOSIS — G8929 Other chronic pain: Secondary | ICD-10-CM

## 2022-05-21 NOTE — Telephone Encounter (Signed)
Dose changed 03/23/22 [place]. Ivin Booty CMA  Requested Prescriptions  Refused Prescriptions Disp Refills   diclofenac (VOLTAREN) 75 MG EC tablet [Pharmacy Med Name: DICLOFENAC SOD EC 75 MG TAB] 60 tablet 0    Sig: TAKE 1 TABLET BY MOUTH TWICE A DAY AS NEEDED     Analgesics:  NSAIDS Failed - 05/18/2022  9:49 AM      Failed - Manual Review: Labs are only required if the patient has taken medication for more than 8 weeks.      Passed - Cr in normal range and within 360 days    Creatinine, Ser  Date Value Ref Range Status  04/28/2022 0.99 0.57 - 1.00 mg/dL Final         Passed - HGB in normal range and within 360 days    Hemoglobin  Date Value Ref Range Status  02/13/2022 13.3 12.0 - 15.0 g/dL Final  02/02/2022 12.4 11.1 - 15.9 g/dL Final         Passed - PLT in normal range and within 360 days    Platelets  Date Value Ref Range Status  02/13/2022 376 150 - 400 K/uL Final  02/02/2022 362 150 - 450 x10E3/uL Final         Passed - HCT in normal range and within 360 days    HCT  Date Value Ref Range Status  02/13/2022 39.6 36.0 - 46.0 % Final   Hematocrit  Date Value Ref Range Status  02/02/2022 36.0 34.0 - 46.6 % Final         Passed - eGFR is 30 or above and within 360 days    eGFR  Date Value Ref Range Status  04/28/2022 62 >59 mL/min/1.73 Final         Passed - Patient is not pregnant      Passed - Valid encounter within last 12 months    Recent Outpatient Visits           2 weeks ago Essential hypertension   [place] at [place], [place], MD   3 weeks ago Osteoarthritis of right knee, unspecified osteoarthritis type   [place] [facility] at [place], [place], MD   1 month ago Osteoarthritis of right knee, unspecified osteoarthritis type   [place] [facility] at [place], [place], MD   2 months ago Osteoarthritis of right knee,  unspecified osteoarthritis type   [facility] at [place], [place], MD   3 months ago Lower GI bleed   [facility] at [place], [place], MD       Future Appointments             In 5 months Juline Patch, MD [facility] at [facility], [facility]

## 2022-05-31 ENCOUNTER — Telehealth: Payer: Self-pay | Admitting: Family Medicine

## 2022-05-31 DIAGNOSIS — M1711 Unilateral primary osteoarthritis, right knee: Secondary | ICD-10-CM

## 2022-06-01 ENCOUNTER — Ambulatory Visit
Admission: RE | Admit: 2022-06-01 | Discharge: 2022-06-01 | Disposition: A | Discharge status: Home / Self Care | Payer: Medicare HMO | Source: Ambulatory Visit | Attending: Gastroenterology | Admitting: Gastroenterology

## 2022-06-01 DIAGNOSIS — K769 Liver disease, unspecified: Secondary | ICD-10-CM | POA: Diagnosis not present

## 2022-06-01 DIAGNOSIS — M9901 Segmental and somatic dysfunction of cervical region: Secondary | ICD-10-CM | POA: Diagnosis not present

## 2022-06-01 DIAGNOSIS — R519 Headache, unspecified: Secondary | ICD-10-CM | POA: Diagnosis not present

## 2022-06-01 DIAGNOSIS — Z9081 Acquired absence of spleen: Secondary | ICD-10-CM | POA: Diagnosis not present

## 2022-06-01 DIAGNOSIS — K7689 Other specified diseases of liver: Secondary | ICD-10-CM | POA: Diagnosis not present

## 2022-06-01 DIAGNOSIS — K7460 Unspecified cirrhosis of liver: Secondary | ICD-10-CM

## 2022-06-01 DIAGNOSIS — M9902 Segmental and somatic dysfunction of thoracic region: Secondary | ICD-10-CM | POA: Diagnosis not present

## 2022-06-01 DIAGNOSIS — R160 Hepatomegaly, not elsewhere classified: Secondary | ICD-10-CM | POA: Diagnosis not present

## 2022-06-01 DIAGNOSIS — M542 Cervicalgia: Secondary | ICD-10-CM | POA: Diagnosis not present

## 2022-06-01 MED ORDER — GADOBUTROL 1 MMOL/ML IV SOLN
8.0000 mL | Freq: Once | INTRAVENOUS | Status: AC | PRN
  Administered 2022-06-01: 7.5 mL via INTRAVENOUS

## 2022-06-01 NOTE — Telephone Encounter (Signed)
Unable to refill per protocol, Rx request is too soon.  Requested Prescriptions  Pending Prescriptions Disp Refills   Diclofenac Sodium CR 100 MG 24 hr tablet [Pharmacy Med Name: DICLOFENAC SOD ER 100 MG TAB] 30 tablet 2    Sig: TAKE 1 TABLET BY MOUTH EVERY DAY     Analgesics:  NSAIDS Failed - 05/31/2022  2:20 PM      Failed - Manual Review: Labs are only required if the patient has taken medication for more than 8 weeks.      Passed - Cr in normal range and within 360 days    Creatinine, Ser  Date Value Ref Range Status  04/28/2022 0.99 0.57 - 1.00 mg/dL Final         Passed - HGB in normal range and within 360 days    Hemoglobin  Date Value Ref Range Status  02/13/2022 13.3 12.0 - 15.0 g/dL Final  02/02/2022 12.4 11.1 - 15.9 g/dL Final         Passed - PLT in normal range and within 360 days    Platelets  Date Value Ref Range Status  02/13/2022 376 150 - 400 K/uL Final  02/02/2022 362 150 - 450 x10E3/uL Final         Passed - HCT in normal range and within 360 days    HCT  Date Value Ref Range Status  02/13/2022 39.6 36.0 - 46.0 % Final   Hematocrit  Date Value Ref Range Status  02/02/2022 36.0 34.0 - 46.6 % Final         Passed - eGFR is 30 or above and within 360 days    eGFR  Date Value Ref Range Status  04/28/2022 62 >59 mL/min/1.73 Final         Passed - Patient is not pregnant      Passed - Valid encounter within last 12 months    Recent Outpatient Visits           3 weeks ago Essential hypertension   [place] [place], [place], MD   1 month ago Osteoarthritis of right knee, unspecified osteoarthritis type   [place] [facility] at [place], [place], MD   1 month ago Osteoarthritis of right knee, unspecified osteoarthritis type   [place] [facility] at [place], [place], MD   2 months ago Osteoarthritis of right knee,  unspecified osteoarthritis type   [facility] at [place], [place], MD   3 months ago Lower GI bleed   [facility] at [place], [place], MD       Future Appointments             In 5 months Juline Patch, MD [place] [facility] at [place], [facility]

## 2022-06-02 ENCOUNTER — Telehealth: Payer: Self-pay

## 2022-06-02 NOTE — Telephone Encounter (Signed)
Called pt to ask if she was using [facility] now d/t forms we received via fax. She stated she was switching to them. I sent back the forms to order Met., Glip., lisinopril and Zetia to fax number [phone]

## 2022-06-05 ENCOUNTER — Other Ambulatory Visit: Payer: Self-pay | Admitting: Family Medicine

## 2022-06-05 ENCOUNTER — Other Ambulatory Visit: Payer: Self-pay

## 2022-06-05 ENCOUNTER — Encounter: Payer: Self-pay | Admitting: Family Medicine

## 2022-06-05 ENCOUNTER — Telehealth: Payer: Self-pay

## 2022-06-05 DIAGNOSIS — M9902 Segmental and somatic dysfunction of thoracic region: Secondary | ICD-10-CM | POA: Diagnosis not present

## 2022-06-05 DIAGNOSIS — M542 Cervicalgia: Secondary | ICD-10-CM | POA: Diagnosis not present

## 2022-06-05 DIAGNOSIS — R519 Headache, unspecified: Secondary | ICD-10-CM | POA: Diagnosis not present

## 2022-06-05 DIAGNOSIS — M9901 Segmental and somatic dysfunction of cervical region: Secondary | ICD-10-CM | POA: Diagnosis not present

## 2022-06-05 DIAGNOSIS — G8929 Other chronic pain: Secondary | ICD-10-CM

## 2022-06-05 DIAGNOSIS — M1711 Unilateral primary osteoarthritis, right knee: Secondary | ICD-10-CM

## 2022-06-05 MED ORDER — DICLOFENAC SODIUM ER 100 MG PO TB24: 100.0000 mg | ORAL_TABLET | Freq: Every day | ORAL | 2 refills | Status: DC

## 2022-06-05 NOTE — Telephone Encounter (Signed)
Refused Voltaren 75 mg tablet because it was discontinued 10/16/2021.

## 2022-06-05 NOTE — Telephone Encounter (Signed)
Pt reports that she will be going out of town for 10 days and she needs the Rx refill for Diclofenac Sodium CR 100 MG 24 hr tablet to be approved asap today because she will be leaving out tomorrow.

## 2022-06-05 NOTE — Telephone Encounter (Signed)
Pt called requesting a return call regarding her MRI results from Jan 8th. Please contact pt when results are available.

## 2022-06-09 ENCOUNTER — Other Ambulatory Visit: Payer: Self-pay

## 2022-06-09 ENCOUNTER — Encounter: Payer: Self-pay | Admitting: Family Medicine

## 2022-06-09 DIAGNOSIS — R911 Solitary pulmonary nodule: Secondary | ICD-10-CM

## 2022-06-09 NOTE — Progress Notes (Unsigned)
Ordered CT Chest for RLL nodule

## 2022-06-19 DIAGNOSIS — R69 Illness, unspecified: Secondary | ICD-10-CM | POA: Diagnosis not present

## 2022-06-19 DIAGNOSIS — F411 Generalized anxiety disorder: Secondary | ICD-10-CM | POA: Diagnosis not present

## 2022-06-22 ENCOUNTER — Encounter: Payer: Self-pay | Admitting: Family Medicine

## 2022-06-25 DIAGNOSIS — R519 Headache, unspecified: Secondary | ICD-10-CM | POA: Diagnosis not present

## 2022-06-25 DIAGNOSIS — M9901 Segmental and somatic dysfunction of cervical region: Secondary | ICD-10-CM | POA: Diagnosis not present

## 2022-06-25 DIAGNOSIS — M9902 Segmental and somatic dysfunction of thoracic region: Secondary | ICD-10-CM | POA: Diagnosis not present

## 2022-06-25 DIAGNOSIS — M542 Cervicalgia: Secondary | ICD-10-CM | POA: Diagnosis not present

## 2022-06-26 ENCOUNTER — Ambulatory Visit
Admission: RE | Admit: 2022-06-26 | Discharge: 2022-06-26 | Disposition: A | Discharge status: Home / Self Care | Payer: Medicare HMO | Source: Ambulatory Visit | Attending: Family Medicine | Admitting: Family Medicine

## 2022-06-26 DIAGNOSIS — J479 Bronchiectasis, uncomplicated: Secondary | ICD-10-CM | POA: Diagnosis not present

## 2022-06-26 DIAGNOSIS — R911 Solitary pulmonary nodule: Secondary | ICD-10-CM | POA: Insufficient documentation

## 2022-06-30 ENCOUNTER — Ambulatory Visit: Payer: Self-pay | Admitting: Family Medicine

## 2022-07-01 ENCOUNTER — Encounter: Payer: Self-pay | Admitting: Family Medicine

## 2022-07-02 ENCOUNTER — Ambulatory Visit
Payer: Medicare HMO | Attending: Student in an Organized Health Care Education/Training Program | Admitting: Student in an Organized Health Care Education/Training Program

## 2022-07-02 ENCOUNTER — Encounter: Payer: Self-pay | Admitting: Student in an Organized Health Care Education/Training Program

## 2022-07-02 ENCOUNTER — Encounter: Payer: Self-pay | Admitting: Family Medicine

## 2022-07-02 ENCOUNTER — Ambulatory Visit (INDEPENDENT_AMBULATORY_CARE_PROVIDER_SITE_OTHER): Payer: Medicare HMO | Admitting: Family Medicine

## 2022-07-02 VITALS — BP 159/91 | HR 77 | Temp 97.3°F | Ht 64.0 in | Wt 182.0 lb

## 2022-07-02 VITALS — BP 118/70 | HR 76 | Ht 64.0 in | Wt 182.0 lb

## 2022-07-02 DIAGNOSIS — M23203 Derangement of unspecified medial meniscus due to old tear or injury, right knee: Secondary | ICD-10-CM | POA: Insufficient documentation

## 2022-07-02 DIAGNOSIS — E1140 Type 2 diabetes mellitus with diabetic neuropathy, unspecified: Secondary | ICD-10-CM

## 2022-07-02 DIAGNOSIS — M25512 Pain in left shoulder: Secondary | ICD-10-CM | POA: Diagnosis not present

## 2022-07-02 DIAGNOSIS — M1711 Unilateral primary osteoarthritis, right knee: Secondary | ICD-10-CM | POA: Diagnosis not present

## 2022-07-02 DIAGNOSIS — M12812 Other specific arthropathies, not elsewhere classified, left shoulder: Secondary | ICD-10-CM | POA: Diagnosis not present

## 2022-07-02 DIAGNOSIS — R519 Headache, unspecified: Secondary | ICD-10-CM | POA: Diagnosis not present

## 2022-07-02 DIAGNOSIS — M75102 Unspecified rotator cuff tear or rupture of left shoulder, not specified as traumatic: Secondary | ICD-10-CM | POA: Insufficient documentation

## 2022-07-02 DIAGNOSIS — G8929 Other chronic pain: Secondary | ICD-10-CM | POA: Insufficient documentation

## 2022-07-02 DIAGNOSIS — M797 Fibromyalgia: Secondary | ICD-10-CM | POA: Diagnosis not present

## 2022-07-02 DIAGNOSIS — R911 Solitary pulmonary nodule: Secondary | ICD-10-CM

## 2022-07-02 DIAGNOSIS — M9901 Segmental and somatic dysfunction of cervical region: Secondary | ICD-10-CM | POA: Diagnosis not present

## 2022-07-02 DIAGNOSIS — J0100 Acute maxillary sinusitis, unspecified: Secondary | ICD-10-CM

## 2022-07-02 DIAGNOSIS — M542 Cervicalgia: Secondary | ICD-10-CM | POA: Diagnosis not present

## 2022-07-02 DIAGNOSIS — M9902 Segmental and somatic dysfunction of thoracic region: Secondary | ICD-10-CM | POA: Diagnosis not present

## 2022-07-02 MED ORDER — AMOXICILLIN 500 MG PO CAPS: 500.0000 mg | ORAL_CAPSULE | Freq: Three times a day (TID) | ORAL | 0 refills | Status: DC

## 2022-07-02 NOTE — Progress Notes (Signed)
Date:  07/02/2022   Name:  Sharon Kaufman   DOB:  07/30/1950   MRN:  [id number]   Chief Complaint: Sinusitis (X1 month, unchanged, sinus pressure, runny nose, face eyes teeth and gums hurt, green mucous/clear/fever)  Sinusitis This is a new problem. The current episode started more than 1 month ago. The problem has been waxing and waning since onset. There has been no fever. Associated symptoms include congestion, coughing, headaches, sinus pressure and sneezing. Pertinent negatives include no shortness of breath or sore throat. Past treatments include oral decongestants (steroid nasal).    Lab Results  Component Value Date   NA 131 (L) 04/28/2022   K 5.5 (H) 04/28/2022   CO2 17 (L) 04/28/2022   GLUCOSE 138 (H) 04/28/2022   BUN 30 (H) 04/28/2022   CREATININE 0.99 04/28/2022   CALCIUM 9.3 04/28/2022   EGFR 62 04/28/2022   Lab Results  Component Value Date   CHOL 216 (H) 01/02/2022   HDL 80 01/02/2022   LDLCALC 123 (H) 01/02/2022   TRIG 76 01/02/2022   No results found for: "TSH" Lab Results  Component Value Date   HGBA1C 5.9 (H) 01/02/2022   Lab Results  Component Value Date   WBC 11.5 (H) 02/13/2022   HGB 13.3 02/13/2022   HCT 39.6 02/13/2022   MCV 91.2 02/13/2022   PLT 376 02/13/2022   Lab Results  Component Value Date   ALT 20 01/02/2022   AST 24 01/02/2022   ALKPHOS 87 01/02/2022   BILITOT <0.2 01/02/2022   Lab Results  Component Value Date   VD25OH 48.6 04/28/2022     Review of Systems  Constitutional:  Negative for fever.  HENT:  Positive for congestion, sinus pressure and sneezing. Negative for sore throat.   Respiratory:  Positive for cough. Negative for shortness of breath.   Cardiovascular:  Negative for chest pain and palpitations.  Neurological:  Positive for headaches.    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   Statin intolerance 03/21/2021   Acquired trigger finger of right middle finger 03/10/2021   Ankylosis of ankle joint, left 03/10/2021   Cirrhosis ([place]) 07/25/2020   Bronchiectasis without complication ([place]) 24/40/1027   Deviated septum 03/22/2020   Allergic rhinitis due to pollen 03/22/2020   Overweight (BMI 25.0-29.9) 12/06/2019   History of cardiovascular stress test 25/36/6440   Diastolic dysfunction without heart failure 10/02/2019   Dyspareunia in female 08/02/2019   Cataract, nuclear sclerotic senile, right 07/24/2019   Fibromyalgia 07/11/2019   History of hepatitis C 07/11/2019   Rheumatoid factor positive 06/06/2019   Other insomnia 06/06/2019   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   Essential hypertension 06/06/2019   Diabetes type 2, controlled ([place]) 06/06/2019   Anxiety 06/06/2019   Age-related osteoporosis without current pathological fracture 01/03/2013   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/2012    Allergies  Allergen Reactions   Quetiapine Rash   Alendronate Other (See Comments)    dyspepsia  Other reaction(s): Other dyspepsia dyspepsia Other reaction(s): Other dyspepsia  Other reaction(s): Other dyspepsia  dyspepsia   Atorvastatin Other (See Comments)    Other reaction(s): Other myalgia myalgia  Other reaction(s): Other myalgia myalgia  Other reaction(s): Other myalgia  myalgia   Codeine Itching and Nausea Only   Pravastatin Other (See Comments)    myalgias   Risedronate Other (See Comments)    dyspepsia    Hydrocodone-Acetaminophen    Amitriptyline Rash    Past Surgical History:  Procedure Laterality Date   ANKLE FUSION Left 2009   APPENDECTOMY  2009   BREAST LUMPECTOMY Left 2008   REDUCTION MAMMAPLASTY Right 2014   ROTATOR CUFF REPAIR Left 2011   SPLENECTOMY  1973   TOTAL SHOULDER REPLACEMENT Right 2019    Social History   Tobacco Use   Smoking status: Former    Types: Cigarettes    Quit date: 2008    Years since quitting: 16.1   Smokeless tobacco: Never  Vaping Use   Vaping Use: Never used  Substance Use Topics   Alcohol use: Yes    Alcohol/week: 2.0 standard drinks of alcohol    Types: 2 Glasses of wine per week   Drug use: Never     Medication list has been reviewed and updated.  Current Meds  Medication Sig   Accu-Chek FastClix Lancets MISC 1 each by Other route daily.   ACCU-CHEK GUIDE test strip USE 1 DAILY.   alendronate (FOSAMAX) 70 MG tablet Take 1 tablet (70 mg total) by mouth every 7 (seven) days.   Ascorbic Acid (VITAMIN C) 1000 MG tablet Take 1,000 mg by mouth daily.   aspirin 81 MG EC tablet Take 81 mg by mouth daily.   Berberine Chloride (BERBERINE HCI PO) Take by mouth.   Calcium Carb-Cholecalciferol 600-10 MG-MCG TABS Take by mouth.   CALCIUM-VITAMIN D PO Take 1 tablet by mouth daily.   Carboxymethylcellul-Glycerin (REFRESH RELIEVA OP) Apply 1 drop to eye daily.   cetirizine (ZYRTEC) 10 MG tablet Take 10 mg by mouth daily.   co-enzyme Q-10 30 MG capsule Take 30 mg by mouth 3 (three) times daily.   codeine 30 MG tablet Take 1 tablet (30 mg total) by mouth every 6 (six) hours as needed.   Diclofenac Sodium CR 100 MG 24 hr tablet Take 1 tablet (100 mg total) by mouth daily.   ezetimibe (ZETIA) 10 MG tablet Take 1 tablet (10 mg total) by mouth daily.   Famotidine (PEPCID PO) Take by mouth.   lisinopril (ZESTRIL) 2.5 MG tablet Take 1 tablet (2.5 mg total) by mouth daily.   LYSINE PO Take 1 tablet by mouth daily.   MAGNESIUM PO Take by mouth at bedtime.    metFORMIN (GLUCOPHAGE-XR) 750 MG 24 hr tablet TAKE 2 TABLETS EVERY DAY WITH BREAKFAST   potassium chloride (KLOR-CON) 10 MEQ tablet TAKE 1 TABLET (10 MEQ) BY MOUTH ONCE DAILY AS NEEDED WHEN TAKING LASIX (FUROSEMIDE)   pseudoephedrine (SUDAFED) 120 MG 12 hr tablet Take 120 mg by mouth 2 (two) times daily.   Semaglutide,0.25 or 0.'5MG'$ /DOS, 2 MG/3ML SOPN Inject into the skin.   Vitamin D, Ergocalciferol, (DRISDOL) 1.25 MG (50000 UNIT) CAPS capsule Take 1 capsule (50,000 Units total) by mouth every 7 (seven) days. Take for 8 total doses(weeks)   zinc gluconate 50 MG tablet Take 50 mg by mouth daily.   zolpidem (AMBIEN) 5 MG tablet Take 5 mg by mouth at bedtime. Psych   [DISCONTINUED] glipiZIDE (GLUCOTROL XL) 2.5 MG  24 hr tablet Take 1 tablet (2.5 mg total) by mouth daily with breakfast.   [DISCONTINUED] lidocaine (LIDODERM) 5 % Place 1 patch onto the skin every 12 (twelve) hours. Remove & Discard patch within 12 hours or as directed by MD   [DISCONTINUED] omeprazole (PRILOSEC) 20 MG capsule Take 20 mg by mouth daily.       07/02/2022   10:01 AM 05/07/2022    3:20 PM 02/13/2022    3:36 PM 01/20/2022    2:13 PM  GAD 7 : Generalized Anxiety Score  Nervous, Anxious, on Edge 0 0 0 0  Control/stop worrying 0 0 0 0  Worry too much - different things 0 0 0 0  Trouble relaxing 0 0 0 0  Restless 0 0 0 0  Easily annoyed or irritable 0 0 0 0  Afraid - awful might happen 0 0 0 0  Total GAD 7 Score 0 0 0 0  Anxiety Difficulty Not difficult at all Not difficult at all Not difficult at all Not difficult at all       07/02/2022   10:01 AM 05/07/2022    3:20 PM 02/13/2022    3:36 PM  Depression screen PHQ 2/9  Decreased Interest  0 0  Down, Depressed, Hopeless 0 0 0  PHQ - 2 Score 0 0 0  Altered sleeping 0 0 0  Tired, decreased energy 0 0 0  Change in appetite 0 0 0  Feeling bad or failure about yourself  0 0 0  Trouble concentrating 0 0 0  Moving slowly or fidgety/restless 0 0 0  Suicidal thoughts  0 0 0  PHQ-9 Score 0 0 0  Difficult doing work/chores Not difficult at all Not difficult at all Not difficult at all    BP Readings from Last 3 Encounters:  07/02/22 118/70  05/07/22 128/78  04/28/22 130/80    Physical Exam Vitals and nursing note reviewed. Exam conducted with a chaperone present.  Constitutional:      General: She is not in acute distress.    Appearance: She is not diaphoretic.  HENT:     Head: Normocephalic and atraumatic.     Right Ear: Hearing, tympanic membrane and external ear normal.     Left Ear: Hearing, tympanic membrane and external ear normal.     Nose:     Right Turbinates: Swollen.     Left Turbinates: Swollen.     Right Sinus: Maxillary sinus tenderness and frontal sinus tenderness present.     Left Sinus: Maxillary sinus tenderness and frontal sinus tenderness present.     Mouth/Throat:     Mouth: Mucous membranes are moist.     Pharynx: Oropharynx is clear. No pharyngeal swelling, oropharyngeal exudate, posterior oropharyngeal erythema or uvula swelling.  Eyes:     General:        Right eye: No discharge.        Left eye: No discharge.     Conjunctiva/sclera: Conjunctivae normal.     Pupils: Pupils are equal, round, and reactive to light.  Neck:     Thyroid: No thyromegaly.     Vascular: No JVD.  Cardiovascular:     Rate and Rhythm: Normal rate and regular rhythm.     Heart sounds: Normal heart sounds, S1 normal and S2 normal. No murmur heard.    No systolic murmur is present.     No diastolic murmur is present.     No friction rub. No gallop. No S3 or  S4 sounds.  Pulmonary:     Effort: Pulmonary effort is normal.     Breath sounds: Normal breath sounds. No decreased breath sounds, wheezing, rhonchi or rales.  Abdominal:     General: Bowel sounds are normal.     Palpations: Abdomen is soft. There is no mass.     Tenderness: There is no abdominal tenderness. There is no guarding.  Musculoskeletal:        General: Normal range of  motion.     Cervical back: Normal range of motion and neck supple.  Lymphadenopathy:     Cervical: No cervical adenopathy.  Skin:    General: Skin is warm and dry.  Neurological:     Mental Status: She is alert.     Wt Readings from Last 3 Encounters:  07/02/22 182 lb (82.6 kg)  05/07/22 182 lb (82.6 kg)  04/28/22 184 lb (83.5 kg)    BP 118/70   Pulse 76   Ht '5\' 4"'$  (1.626 m)   Wt 182 lb (82.6 kg)   SpO2 97%   BMI 31.24 kg/m   Assessment and Plan:  1. Pulmonary nodule 1 cm or greater in diameter Newly noted on MRI of the abdomen January 2024 solid pulmonary nodule of the right lower lobe measuring 1.5 x 1 cm.  Options were given for 3 months eval which included repeat CT/CT PET scan/tissue biopsy.  After discussion with patient we have decided to consult pulmonary and we will place consultation with with Dr Mortimer Fries recommendation as next step to take in 3 months. - Ambulatory referral to Pulmonology  2. Acute maxillary sinusitis, recurrence not specified New onset.  Episodic.  Persistent for about a month.  There is tenderness and discomfort of the frontal and maxillary sinuses.  Will treat with amoxicillin 500 mg 3 times a day for 10 days as well as use of nasal saline lavage nasal steroid and Sudafed. - amoxicillin (AMOXIL) 500 MG capsule; Take 1 capsule (500 mg total) by mouth 3 (three) times daily for 10 days.  Dispense: 30 capsule; Refill: 0    Otilio Miu, MD

## 2022-07-02 NOTE — Patient Instructions (Signed)
____________________________________________________________________________________________  General Risks and Possible Complications  Patient Responsibilities: It is important that you read this as it is part of your informed consent. It is our duty to inform you of the risks and possible complications associated with treatments offered to you. It is your responsibility as a patient to read this and to ask questions about anything that is not clear or that you believe was not covered in this document.  Patient's Rights: You have the right to refuse treatment. You also have the right to change your mind, even after initially having agreed to have the treatment done. However, under this last option, if you wait until the last second to change your mind, you may be charged for the materials used up to that point.  Introduction: Medicine is not an Chief Strategy Officer. Everything in Medicine, including the lack of treatment(s), carries the potential for danger, harm, or loss (which is by definition: Risk). In Medicine, a complication is a secondary problem, condition, or disease that can aggravate an already existing one. All treatments carry the risk of possible complications. The fact that a side effects or complications occurs, does not imply that the treatment was conducted incorrectly. It must be clearly understood that these can happen even when everything is done following the highest safety standards.  No treatment: You can choose not to proceed with the proposed treatment alternative. The "PRO(s)" would include: avoiding the risk of complications associated with the therapy. The "CON(s)" would include: not getting any of the treatment benefits. These benefits fall under one of three categories: diagnostic; therapeutic; and/or palliative. Diagnostic benefits include: getting information which can ultimately lead to improvement of the disease or symptom(s). Therapeutic benefits are those associated with  the successful treatment of the disease. Finally, palliative benefits are those related to the decrease of the primary symptoms, without necessarily curing the condition (example: decreasing the pain from a flare-up of a chronic condition, such as incurable terminal cancer).  General Risks and Complications: These are associated to most interventional treatments. They can occur alone, or in combination. They fall under one of the following six (6) categories: no benefit or worsening of symptoms; bleeding; infection; nerve damage; allergic reactions; and/or death. No benefits or worsening of symptoms: In Medicine there are no guarantees, only probabilities. No healthcare provider can ever guarantee that a medical treatment will work, they can only state the probability that it may. Furthermore, there is always the possibility that the condition may worsen, either directly, or indirectly, as a consequence of the treatment. Bleeding: This is more common if the patient is taking a blood thinner, either prescription or over the counter (example: Goody Powders, Fish oil, Aspirin, Garlic, etc.), or if suffering a condition associated with impaired coagulation (example: Hemophilia, cirrhosis of the liver, low platelet counts, etc.). However, even if you do not have one on these, it can still happen. If you have any of these conditions, or take one of these drugs, make sure to notify your treating physician. Infection: This is more common in patients with a compromised immune system, either due to disease (example: diabetes, cancer, human immunodeficiency virus [HIV], etc.), or due to medications or treatments (example: therapies used to treat cancer and rheumatological diseases). However, even if you do not have one on these, it can still happen. If you have any of these conditions, or take one of these drugs, make sure to notify your treating physician. Nerve Damage: This is more common when the treatment is an  invasive one, but it can also happen with the use of medications, such as those used in the treatment of cancer. The damage can occur to small secondary nerves, or to large primary ones, such as those in the spinal cord and brain. This damage may be temporary or permanent and it may lead to impairments that can range from temporary numbness to permanent paralysis and/or brain death. Allergic Reactions: Any time a substance or material comes in contact with our body, there is the possibility of an allergic reaction. These can range from a mild skin rash (contact dermatitis) to a severe systemic reaction (anaphylactic reaction), which can result in death. Death: In general, any medical intervention can result in death, most of the time due to an unforeseen complication. ____________________________________________________________________________________________   ____________________________________________________________________________________________  Genicular Nerve Block  What is a genicular nerve block? A genicular nerve block is the injection of a local anesthetic to block the nerves that transmits pain from the knee.  What is the purpose of a facet nerve block? A genicular nerve block is a diagnostic procedure to determine if the pathologic changes (i.e. arthritis, meniscal tears, etc) and inflammation within the knee joint is the source of your knee pain. It also confirms that the knee pain will respond well to the actual treatment procedure. If a genicular nerve block works, it will give you relief for several hours. After that, the pain is expected to return to normal. This test is always performed twice (usually a week or two apart) because two successful tests are required to move onto treatment. If both diagnostic tests are positive, then we schedule a treatment called radiofrequency (RF) ablation. In this procedure, the same nerves are cauterized, which typically leads to pain relief for 4  -18 months. If this process works well for one knee, it can be performed on the other knee if needed.  How is the procedure performed? You will be placed on the procedure table. The injection site is sterilized with either iodine or chlorhexadine. The site to be injected is numbed with a local anesthetic, and a needle is directed to the target area. X-ray guidance is used to ensure proper placement and positioning of the needle. When the needle is properly positioned near the genicular nerve, local anesthetic is injected to numb that nerve. This will be repeated at multiple sites around the knee to block all genicular nerves.  Will the procedure be painful? The injection can be painful and we therefore provide the option of receiving IV sedation. IV sedation, combined with local anesthetic, can make the injection nearly pain free. It allows you to remain very still during the procedure, which can also make the injection easier, faster, and more successful. If you decide to have IV sedation, you must have a driver to get you home safely afterwards. In addition, you cannot have anything to eat or drink within 8 hours of your appointment (clear liquids are allowed until 3 hours before the procedure). If you take medications for diabetes, these medications may need to be adjusted the morning of the procedure. Your primary care physician can help you with this adjustment.  What are the discharge instructions? If you received IV sedation do not drive or operate machinery for at least 24 hours after the procedure. You may return to work the next day following your procedure. You may resume your normal diet immediately. Do not engage in any strenuous activity for 24 hours. You should, however, engage in moderate activity that  typically causes your ususal pain. If the block works, those activities should not be painful for several hours after the injection. Do not take a bath, swim, or use a hot tub for 24 hours  (you may take a shower). Call the office if you have any of the following: severe pain afterwards (different than your usual symptoms), redness/swelling/discharge at the injection site(s), fevers/chills, difficulty with bowel or bladder functions.  What are the risks and side effects? The complication rate for this procedure is very low. Whenever a needle enters the skin, bleeding or infection can occur. Some other serious but extremely rare risks include paralysis and death. You may have an allergic reaction to any of the medications used. If you have a known allergy to any medications, especially local anesthetics, notify our staff before the procedure takes place. You may experience any of the following side effects up to 4 - 6 hours after the procedure: Leg muscle weakness or numbness may occur due to the local anesthetic affecting the nerves that control your legs (this is a temporary affect and it is not paralysis). If you have any leg weakness or numbness, walk only with assistance in order to prevent falls and injury. Your leg strength will return slowly and completely. Dizziness may occur due to a decrease in your blood pressure. If this occurs, remain in a seated or lying position. Gradually sit up, and then stand after at least 10 minutes of sitting. Mild headaches may occur. Drink fluids and take pain medications if needed. If the headaches persist or become severe, call the office. Mild discomfort at the injection site can occur. This typically lasts for a few hours but can persist for a couple days. If this occurs, take anti-inflammatories or pain medications, apply ice to the area the day of the procedure. If it persists, apply moist heat in the day(s) following.  The side effects listed above can be normal. They are not dangerous and will resolve on their own. If, however, you experience any of the following, a complication may have occurred and you should either contact your doctor. If he  is not readily available, then you should proceed to the closest urgent care center for evaluation: Severe or progressive pain at the injection site(s) Arm or leg weakness that progressively worsens or persists for longer than 8 hours Severe or progressive redness, swelling, or discharge from the injections site(s) Fevers, chills, nausea, or vomiting Bowel or bladder dysfunction (i.e. inability to urinate or pass stool or difficulty controlling either)  How long does it take for the procedure to work? You should feel relief from your usual pain within the first hour. Again, this is only expected to last for several hours, at the most. Remember, you may be sore in the middle part of your back from the needles, and you must distinguish this from your usual pain. ____________________________________________________________________________________________  Selective Nerve Root Block Patient Information  Description: Specific nerve roots exit the spinal canal and these nerves can be compressed and inflamed by a bulging disc and bone spurs.  By injecting steroids on the nerve root, we can potentially decrease the inflammation surrounding these nerves, which often leads to decreased pain.  Also, by injecting local anesthesia on the nerve root, this can provide [place] helpful information to give to your referring doctor if it decreases your pain.  Selective nerve root blocks can be done along the spine from the neck to the low back depending on the location of your pain.  After numbing the skin with local anesthesia, a small needle is passed to the nerve root and the position of the needle is verified using x-ray pictures.  After the needle is in correct position, we then deposit the medication.  You may experience a pressure sensation while this is being done.  The entire block usually lasts less than 15 minutes.  Conditions that may be treated with selective nerve root blocks: Low back and leg pain Spinal  stenosis Diagnostic block prior to potential surgery Neck and arm pain Post laminectomy syndrome  Preparation for the injection:  Do not eat any solid food or dairy products within 8 hours of your appointment. You may drink clear liquids up to 3 hours before an appointment.  Clear liquids include water, black coffee, juice or soda.  No milk or cream please. You may take your regular medications, including pain medications, with a sip of water before your appointment.  Diabetics should hold regular insulin (if taken separately) and take 1/2 normal NPH dose the morning of the procedure.  Carry some sugar containing items with you to your appointment. A driver must accompany you and be prepared to drive you home after your procedure. Bring all your current medications with you. An IV may be inserted and sedation may be given at the discretion of the physician. A blood pressure cuff, EKG, and other monitors will often be applied during the procedure.  Some patients may need to have extra oxygen administered for a short period. You will be asked to provide medical information, including allergies, prior to the procedure.  We must know immediately if you are taking blood  Thinners (like Coumadin) or if you are allergic to IV iodine contrast (dye).  Possible side-effects: All are usually temporary Bleeding from needle site Light headedness Numbness and tingling Decreased blood pressure Weakness in arms/legs Pressure sensation in back/neck Pain at injection site (several days)  Possible complications: All are extremely rare Infection Nerve injury Spinal headache (a headache wore with upright position)  Call if you experience: Fever/chills associated with headache or increased back/neck pain Headache worsened by an upright position New onset weakness or numbness of an extremity below the injection site Hives or difficulty breathing (go to the emergency room) Inflammation or drainage at  the injection site(s) Severe back/neck pain greater than usual New symptoms which are concerning to you  Please note:  Although the local anesthetic injected can often make your back or neck feel good for several hours after the injection the pain will likely return.  It takes 3-5 days for steroids to work on the nerve root. You may not notice any pain relief for at least one week.  If effective, we will often do a series of 3 injections spaced 3-6 weeks apart to maximally decrease your pain.    If you have any questions, please call [phone] [facility] Patient Information  Description: The celiac plexus is a group of nerves which are part of the sympathetic nervous system.  These nerves supply organs in the abdomen and pelvis.  Specific organs supplied with sensation by the celiac plexus include the stomach, liver, gallbladder, pancreas, kidneys and part of the gut.   The celiac plexus is located on both sides of the aorta at approximately the level of the first lumbar vertebral body.  The block will be performed with you lying on your abdomen with a pillow underneath.  Using direct x-ray guidance, the celiac plexus  will be located on both sides of the spine.  Numbing medicine will be used to deaden the skin prior to needle insertion.  In most cases, a small amount of sedation can be given by IV prior to the numbing medicine.  Two small needles will be place near the celiac plexus and local anesthetic and steroid will be injected.  The entire block usually last about 15-25 minutes.  Conditions which may be treated by celiac plexus block:  Acute and chronic pancreatitis Pain from liver or pancreatic cancer Pain from Crohn's disease Other types of abdominal or flank pain  Preparation for the injection:  Do not eat any solid food or dairy products within 8 hours of your appointment. You may drink clear liquids up to 3 hours before  appointment.  Clear liquids include water, black coffee, juice or soda.  No milk or cream please. You may take your regular medication, including pain medications, with a sip of water before your appointment.  Diabetics should hold regular insulin (if taken separately) and take 1/2 normal NPH dose in the morning of the procedure.  Carry some sugar containing items with you to your appointment. A driver must accompany you and be prepared to drive you home after your procedure. Bring all your current medications with you. An IV may be inserted and sedation may be given at the discretion of the physician. A blood pressure cuff, EKG, and other monitors will often be applied during the procedure.  Some patients may need to have extra oxygen administered for a short period. You will be asked to provide medical information, including your allergies and medications, prior to the procedure.  We must know immediately if you are taking blood thinners (like Coumadin/Warfarin) or if you are allergic to IV iodine contrast (dye).  We must know if you could possible be pregnant.  Possible side-effects:  Bleeding from needle site or deeper Infection (rarre, can require surgery) Nerve injury (rare) Numbness & tingling (temporary) Collapsed lung (rare) Spinal headache ( a headache worse with upright posture) Light-headedness (temporary) Pain at injection site (several days) Decreased blood pressure (temporary) Weakness in legs (temporary) Seizure or other drug reaction (rare)  Call if you experience:  Fever/chills associated with headache or increased back/neck pain Headache worsened by an upright position New onset weakness or numbness of an extremity below the injection site. Hives or difficulty breathing (go to the emergency room) Inflammation or drainage at the injection site. New onset diarrhea lasting more than 2 weeks. New symptoms which are concerning to you  Please note:  If effective, we  will often do a series of 2-3 injections spaced 3-6 weeks apart to maximally decrease your pain.  If initial series is effective, you may be a candidate for a more permanent block of the celiac plexus. .  If you have questions, please call [phone] [facility]

## 2022-07-02 NOTE — Progress Notes (Signed)
Patient: Sharon Kaufman  Service Category: E/M  Provider: Gillis Santa, MD  DOB: August 16, 1950  DOS: 07/02/2022  Referring Provider: Montel Culver, MD  MRN: [id number]  Setting: Ambulatory outpatient  PCP: Juline Patch, MD  Type: New Patient  Specialty: Interventional Pain Management    Location: Office  Delivery: Face-to-face     Primary Reason(s) for Visit: Encounter for initial evaluation of one or more chronic problems (new to examiner) potentially causing chronic pain, and posing a threat to normal musculoskeletal function. (Level of risk: High) CC: Shoulder Pain (left)  HPI  Sharon Kaufman is a 72 y.o. year old, female patient, who comes for the first time to our practice referred by Zigmund Daniel Earley Abide, MD for our initial evaluation of her chronic pain. She has Statin intolerance; Rheumatoid factor positive; Overweight (BMI 25.0-29.9); Other insomnia; Osteoarthritis (arthritis due to wear and tear of joints); Medial tibial stress syndrome; Fibromyalgia; History of splenectomy; History of hepatitis C; History of ductal carcinoma in situ (DCIS) of breast; History of cardiovascular stress test; Essential hypertension; Dyspareunia in female; Diastolic dysfunction without heart failure; Diabetes type 2, controlled ([place]); Deviated septum; Cirrhosis ([place]); Cataract, nuclear sclerotic senile, right; Bronchiectasis without complication ([place]); Anxiety; Allergic rhinitis due to pollen; Age-related osteoporosis without current pathological fracture; Acquired trigger finger of right middle finger; Traumatic arthropathy of ankle and foot; Ankylosis of ankle joint, left; Osteoarthritis of right knee; Plantar fasciitis, right; Chronic musculoskeletal pain; Positive ANA (antinuclear antibody); Peroneal tendinitis, right; Stenosing tenosynovitis of finger of right hand; Pes anserinus bursitis of right knee; Abnormal echocardiogram; Left rotator cuff tear arthropathy (hx of left shoulder surgery); Chronic painful  diabetic neuropathy ([place]); and Old tear of medial meniscus of right knee on their problem list. Today she comes in for evaluation of her Shoulder Pain (left)  Pain Assessment: Location: Left Shoulder Radiating: Denies Onset: More than a month ago Duration: Chronic pain Quality: Aching, Nagging, Constant, Shooting Severity: 7 /10 (subjective, self-reported pain score)  Effect on ADL: limits my daily activities Timing: Constant Modifying factors: Meds, heat, BP: (!) 159/91  HR: 77  Onset and Duration: Sudden and Gradual Cause of pain: Unknown Severity: Getting worse, NAS-11 at its worse: 8/10, NAS-11 at its best: 5/10, NAS-11 now: 5/10, and NAS-11 on the average: 6/10 Timing: Not influenced by the time of the day, During activity or exercise, After activity or exercise, and After a period of immobility Aggravating Factors: Climbing, Kneeling, Motion, Prolonged sitting, Prolonged standing, Squatting, Stooping , and Walking Alleviating Factors: Hot packs, Lying down, Medications, Resting, Using a brace, and Chiropractic manipulations Associated Problems: Constipation, Night-time cramps, Fatigue, and Sweating Quality of Pain: Aching, Annoying, Intermittent, Distressing, Fearful, Itching, Nagging, Pulsating, Sharp, Shooting, Tender, Throbbing, Toothache-like, and Uncomfortable Previous Examinations or Tests: Orthopedic evaluation and Chiropractic evaluation Previous Treatments: Chiropractic manipulations  Sharon Kaufman is being evaluated for possible interventional pain management therapies for the treatment of her chronic pain.   Sharon Kaufman is a pleasant 72 year old female who presents with a chief complaint of left shoulder pain and right knee pain.  She also has other musculoskeletal complaints including neck pain, hip pain and ankle pain.  She has a history of fibromyalgia.  She has a history of right shoulder replacement and states that her right shoulder is doing well since that surgery.  She  also has a prior history of left shoulder arthroscopic surgery which she describes as a debridement and rotator cuff repair.  She has pain and limited range of motion of her  left shoulder.  She has tried physical therapy as well as various medications to help manage her pain including NSAIDs, gabapentin, Lyrica, Cymbalta, amitriptyline which were not effective or resulted in side effects.  She has a right knee brace in place.  She has tried a right knee intra-articular steroid injection with extended release triamcinolone with limited response.  She also has diabetes, not on insulin.  She does have intermittent paresthesias of bilateral feet.  She sees a Restaurant manager, fast food.  She is also done physical therapy, last visit was approximately 8 months ago.  Meds   Current Outpatient Medications:    Accu-Chek FastClix Lancets MISC, 1 each by Other route daily., Disp: , Rfl:    ACCU-CHEK GUIDE test strip, USE 1 DAILY., Disp: 100 strip, Rfl: 1   acetaminophen (TYLENOL) 650 MG CR tablet, Take 650 mg by mouth every 8 (eight) hours as needed for pain., Disp: , Rfl:    alendronate (FOSAMAX) 70 MG tablet, Take 1 tablet (70 mg total) by mouth every 7 (seven) days., Disp: 4 tablet, Rfl: 2   Ascorbic Acid (VITAMIN C) 1000 MG tablet, Take 1,000 mg by mouth daily., Disp: , Rfl:    aspirin 81 MG EC tablet, Take 81 mg by mouth daily., Disp: , Rfl:    Bioflavonoid Products (QUERCETIN COMPLEX IMMUNE PO), Take by mouth., Disp: , Rfl:    Biotin 10 MG CAPS, Take by mouth., Disp: , Rfl:    CALCIUM-VITAMIN D PO, Take 1 tablet by mouth daily., Disp: , Rfl:    carbamazepine (TEGRETOL) 200 MG tablet, Take 200 mg by mouth 3 (three) times daily., Disp: , Rfl:    Carboxymethylcellul-Glycerin (REFRESH RELIEVA OP), Apply 1 drop to eye daily., Disp: , Rfl:    cetirizine (ZYRTEC) 10 MG tablet, Take 10 mg by mouth daily., Disp: , Rfl:    co-enzyme Q-10 30 MG capsule, Take 30 mg by mouth 3 (three) times daily., Disp: , Rfl:    Diclofenac  Sodium CR 100 MG 24 hr tablet, Take 1 tablet (100 mg total) by mouth daily., Disp: 30 tablet, Rfl: 2   ezetimibe (ZETIA) 10 MG tablet, Take 1 tablet (10 mg total) by mouth daily., Disp: 90 tablet, Rfl: 1   Famotidine (PEPCID PO), Take by mouth., Disp: , Rfl:    ibuprofen (ADVIL) 800 MG tablet, Take 800 mg by mouth every 8 (eight) hours as needed., Disp: , Rfl:    lisinopril (ZESTRIL) 2.5 MG tablet, Take 1 tablet (2.5 mg total) by mouth daily., Disp: 90 tablet, Rfl: 1   LYSINE PO, Take 1 tablet by mouth daily., Disp: , Rfl:    MAGNESIUM PO, Take by mouth at bedtime., Disp: , Rfl:    meloxicam (MOBIC) 15 MG tablet, Take 15 mg by mouth daily., Disp: , Rfl:    metFORMIN (GLUCOPHAGE-XR) 750 MG 24 hr tablet, TAKE 2 TABLETS EVERY DAY WITH BREAKFAST, Disp: 180 tablet, Rfl: 0   pseudoephedrine (SUDAFED) 120 MG 12 hr tablet, Take 120 mg by mouth 2 (two) times daily., Disp: , Rfl:    Semaglutide,0.25 or 0.'5MG'$ /DOS, 2 MG/3ML SOPN, Inject into the skin., Disp: , Rfl:    Vitamin D, Ergocalciferol, (DRISDOL) 1.25 MG (50000 UNIT) CAPS capsule, Take 1 capsule (50,000 Units total) by mouth every 7 (seven) days. Take for 8 total doses(weeks), Disp: 8 capsule, Rfl: 0   vitamin E 1000 UNIT capsule, Take 1,000 Units by mouth daily., Disp: , Rfl:    zinc gluconate 50 MG tablet, Take 50 mg by  mouth daily., Disp: , Rfl:    zolpidem (AMBIEN) 5 MG tablet, Take 5 mg by mouth at bedtime. Psych, Disp: , Rfl:   Imaging Review   DG Shoulder Left  Narrative CLINICAL DATA:  Pain  EXAM: LEFT SHOULDER - 2+ VIEW  COMPARISON:  None.  FINDINGS: No recent fracture or dislocation is seen. There are no abnormal soft tissue calcifications. Degenerative changes are noted in glenohumeral joint with bony spurs. There is deformity of lateral end of left clavicle which may be normal variation or residual change from previous injury.  IMPRESSION: No recent fracture or dislocation is seen. Degenerative changes are noted with bony  spurs in the left shoulder joint.   Electronically Signed By: Elmer Picker M.D. On: 07/30/2021 18:17   Narrative CLINICAL DATA:  Right medial knee pain and swelling for the past 2 weeks. No injury or prior surgery.  EXAM: MRI OF THE RIGHT KNEE WITHOUT CONTRAST  TECHNIQUE: Multiplanar, multisequence MR imaging of the knee was performed. No intravenous contrast was administered.  COMPARISON:  Right knee x-rays dated July 29, 2021.  FINDINGS: MENISCI  Medial meniscus:  Radial tearing of the body and posterior horn.  Lateral meniscus:  Intact.  LIGAMENTS  Cruciates:  Intact ACL and PCL.  Collaterals: Medial collateral ligament is intact. Lateral collateral ligament complex is intact.  CARTILAGE  Patellofemoral:  No chondral defect.  Medial: Full-thickness cartilage loss over the central weight-bearing medial femoral condyle and central peripheral medial tibial plateau.  Lateral: Full-thickness cartilage loss over the mesials aspect of the lateral femoral condyle and lateral tibial plateau.  Joint:  Small joint effusion.  Normal Hoffa's fat.  Popliteal Fossa:  No Baker cyst. Intact popliteus tendon.  Extensor Mechanism: Intact quadriceps tendon and patellar tendon. Intact medial and lateral patellar retinaculum. Intact MPFL.  Bones: Subchondral insufficiency fracture of the medial tibial plateau with focal disruption of the articular cortex (series 11, image 15). No dislocation. No suspicious bone lesion.  Other: None.  IMPRESSION: 1. Subchondral insufficiency fracture of the medial tibial plateau. 2. Radial tearing of the medial meniscus body and posterior horn. 3. Moderate medial and mild lateral compartment osteoarthritis.   Electronically Signed By: Titus Dubin M.D. On: 05/08/2022 15:07   Narrative CLINICAL DATA:  Initial evaluation for right knee pain, history of breast cancer.  EXAM: RIGHT KNEE - COMPLETE 4+ VIEW  COMPARISON:   None available.  FINDINGS: No acute fracture or dislocation. No joint effusion. Mild juxta-articular osteoarthritic spurring present at the patellofemoral and medial femorotibial joint space compartments. Osseous mineralization normal. No focal osseous lesions. No soft tissue abnormality.  IMPRESSION: 1. Mild for age degenerative osteoarthritic changes about the patellofemoral and medial femorotibial joint space compartments. 2. No acute osseous finding.   Electronically Signed By: Jeannine Boga M.D. On: 07/30/2021 18:04   DG Ankle Complete Left  Narrative CLINICAL DATA:  Pain  EXAM: LEFT ANKLE COMPLETE - 3+ VIEW  COMPARISON:  None.  FINDINGS: There is previous surgical fusion of left ankle joint. No recent fracture or dislocation is seen. Deformity in the distal shaft of fibula may be residual from previous injury. Small bony spurs seen in the dorsal aspect of talonavicular joint.  IMPRESSION: No recent fracture or dislocation is seen. There is previous surgical fusion of left ankle joint.   Electronically Signed By: Elmer Picker M.D. On: 07/30/2021 18:19   Narrative CLINICAL DATA:  Pain right foot  EXAM: RIGHT FOOT COMPLETE - 3+ VIEW  COMPARISON:  None.  FINDINGS: No fracture or dislocation is seen. Hallux valgus deformity is noted. There is bony protrusion, possibly as bony spur in the dorsal aspect of anterior talus. There is possible minimal soft tissue calcification in the plantar fascia.  IMPRESSION: No recent fracture or dislocation is seen in the right foot. Hallux valgus deformity is noted. Possible minimal calcifications are seen in the plantar fascia suggesting possible chronic inflammation.   Electronically Signed By: Elmer Picker M.D. On: 07/30/2021 18:16  Complexity Note: Imaging results reviewed.                         ROS  Cardiovascular: Daily Aspirin intake, Blood thinners:  Antiplatelet, and Needs  antibiotics prior to dental procedures Pulmonary or Respiratory: Snoring  Neurological: No reported neurological signs or symptoms such as seizures, abnormal skin sensations, urinary and/or fecal incontinence, being born with an abnormal open spine and/or a tethered spinal cord Psychological-Psychiatric: No reported psychological or psychiatric signs or symptoms such as difficulty sleeping, anxiety, depression, delusions or hallucinations (schizophrenial), mood swings (bipolar disorders) or suicidal ideations or attempts Gastrointestinal: Inflamed liver (Hepatitis), Scarred liver (Cirrhosis), and Irregular, infrequent bowel movements (Constipation) Genitourinary: No reported renal or genitourinary signs or symptoms such as difficulty voiding or producing urine, peeing blood, non-functioning kidney, kidney stones, difficulty emptying the bladder, difficulty controlling the flow of urine, or chronic kidney disease Hematological: Brusing easily Endocrine: High blood sugar requiring insulin (IDDM) Rheumatologic: Joint aches and or swelling due to excess weight (Osteoarthritis) Musculoskeletal: Negative for myasthenia gravis, muscular dystrophy, multiple sclerosis or malignant hyperthermia Work History: Retired  Allergies  Sharon Kaufman is allergic to quetiapine, alendronate, atorvastatin, codeine, pravastatin, risedronate, hydrocodone-acetaminophen, and amitriptyline.  Laboratory Chemistry Profile   Renal Lab Results  Component Value Date   BUN 30 (H) 04/28/2022   CREATININE 0.99 04/28/2022   BCR 30 (H) 04/28/2022     Electrolytes Lab Results  Component Value Date   NA 131 (L) 04/28/2022   K 5.5 (H) 04/28/2022   CL 98 04/28/2022   CALCIUM 9.3 04/28/2022   PHOS 2.7 (L) 12/02/2021     Hepatic Lab Results  Component Value Date   AST 24 01/02/2022   ALT 20 01/02/2022   ALBUMIN 4.4 01/02/2022   ALKPHOS 87 01/02/2022     ID Lab Results  Component Value Date   SARSCOV2NAA NEGATIVE  09/12/2021     Bone Lab Results  Component Value Date   VD25OH 48.6 04/28/2022     Endocrine Lab Results  Component Value Date   GLUCOSE 138 (H) 04/28/2022   HGBA1C 5.9 (H) 01/02/2022     Neuropathy Lab Results  Component Value Date   HGBA1C 5.9 (H) 01/02/2022     CNS No results found for: "COLORCSF", "APPEARCSF", "RBCCOUNTCSF", "WBCCSF", "POLYSCSF", "LYMPHSCSF", "EOSCSF", "PROTEINCSF", "GLUCCSF", "JCVIRUS", "CSFOLI", "IGGCSF", "LABACHR", "ACETBL"   Inflammation (CRP: Acute  ESR: Chronic) No results found for: "CRP", "ESRSEDRATE", "LATICACIDVEN"   Rheumatology No results found for: "RF", "ANA", "LABURIC", "URICUR", "LYMEIGGIGMAB", "LYMEABIGMQN", "HLAB27"   Coagulation Lab Results  Component Value Date   PLT 376 02/13/2022     Cardiovascular Lab Results  Component Value Date   HGB 13.3 02/13/2022   HCT 39.6 02/13/2022     Screening Lab Results  Component Value Date   SARSCOV2NAA NEGATIVE 09/12/2021     Cancer No results found for: "CEA", "CA125", "LABCA2"   Allergens No results found for: "ALMOND", "APPLE", "ASPARAGUS", "AVOCADO", "BANANA", "BARLEY", "BASIL", "BAYLEAF", "GREENBEAN", "LIMABEAN", "WHITEBEAN", "BEEFIGE", "  REDBEET", "BLUEBERRY", "BROCCOLI", "CABBAGE", "MELON", "CARROT", "CASEIN", "CASHEWNUT", "CAULIFLOWER", "CELERY"     Note: Lab results reviewed.  PFSH  Drug: Sharon Kaufman  reports no history of drug use. Alcohol:  reports current alcohol use of about 2.0 standard drinks of alcohol per week. Tobacco:  reports that she quit smoking about 16 years ago. Her smoking use included cigarettes. She has never used smokeless tobacco. Medical:  has a past medical history of Allergy, Anxiety, Breast cancer ([place]), Breast cancer, left ([place]) (2008), Diabetes mellitus without complication ([place]), GERD (gastroesophageal reflux disease), Hyperlipidemia, Hypertension, and Personal history of radiation therapy (2008). Family: family history includes Alcohol abuse in  her father; Liver disease in her father; Rheum arthritis in her mother.  Past Surgical History:  Procedure Laterality Date   ANKLE FUSION Left 2009   APPENDECTOMY  2009   BREAST LUMPECTOMY Left 2008   REDUCTION MAMMAPLASTY Right 2014   ROTATOR CUFF REPAIR Left 2011   SPLENECTOMY  1973   TOTAL SHOULDER REPLACEMENT Right 2019   Active Ambulatory Problems    Diagnosis Date Noted   Statin intolerance 03/21/2021   Rheumatoid factor positive 06/06/2019   Overweight (BMI 25.0-29.9) 12/06/2019   Other insomnia 06/06/2019   Osteoarthritis (arthritis due to wear and tear of joints) 07/29/2021   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/2012   Fibromyalgia 07/11/2019   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   History of hepatitis C 07/11/2019   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   History of cardiovascular stress test 10/02/2019   Essential hypertension 06/06/2019   Dyspareunia in female 92/42/6834   Diastolic dysfunction without heart failure 10/02/2019   Diabetes type 2, controlled ([place]) 06/06/2019   Deviated septum 03/22/2020   Cirrhosis ([place]) 07/25/2020   Cataract, nuclear sclerotic senile, right 07/24/2019   Bronchiectasis without complication ([place]) 19/62/2297   Anxiety 06/06/2019   Allergic rhinitis due to pollen 03/22/2020   Age-related osteoporosis without current pathological fracture 01/03/2013   Acquired trigger finger of right middle finger 03/10/2021   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   Ankylosis of ankle joint, left 03/10/2021   Osteoarthritis of right knee 07/29/2021   Plantar fasciitis, right 07/29/2021   Chronic musculoskeletal pain 08/05/2021   Positive ANA (antinuclear antibody) 08/22/2021   Peroneal tendinitis, right 10/03/2021   Stenosing tenosynovitis of finger of right hand 02/10/2022   Pes anserinus bursitis of right knee 03/17/2022   Abnormal echocardiogram 03/21/2022   Left rotator cuff tear arthropathy (hx of left  shoulder surgery) 07/02/2022   Chronic painful diabetic neuropathy ([place]) 07/02/2022   Old tear of medial meniscus of right knee 07/02/2022   Resolved Ambulatory Problems    Diagnosis Date Noted   Hypertrophy of inter-atrial septum 10/02/2019   Past Medical History:  Diagnosis Date   Allergy    Breast cancer ([place])    Breast cancer, left ([place]) 2008   Diabetes mellitus without complication ([place])    GERD (gastroesophageal reflux disease)    Hyperlipidemia    Hypertension    Personal history of radiation therapy 2008   Constitutional Exam  General appearance: Well nourished, well developed, and well hydrated. In no apparent acute distress Vitals:   07/02/22 1430 07/02/22 1436  BP: (!) 174/92 (!) 159/91  Pulse: 77   Temp: (!) 97.3 F (36.3 C)   SpO2: 97%   Weight: 182 lb (82.6 kg)   Height: '5\' 4"'$  (1.626 m)    BMI Assessment: Estimated body mass index is 31.24 kg/m as  calculated from the following:   Height as of this encounter: '5\' 4"'$  (1.626 m).   Weight as of this encounter: 182 lb (82.6 kg).  BMI interpretation table: BMI level Category Range association with higher incidence of chronic pain  <18 kg/m2 Underweight   18.5-24.9 kg/m2 Ideal body weight   25-29.9 kg/m2 Overweight Increased incidence by 20%  30-34.9 kg/m2 Obese (Class I) Increased incidence by 68%  35-39.9 kg/m2 Severe obesity (Class II) Increased incidence by 136%  >40 kg/m2 Extreme obesity (Class III) Increased incidence by 254%   Patient's current BMI Ideal Body weight  Body mass index is 31.24 kg/m. Ideal body weight: 54.7 kg (120 lb 9.5 oz) Adjusted ideal body weight: 65.8 kg (145 lb 2.5 oz)   BMI Readings from Last 4 Encounters:  07/02/22 31.24 kg/m  07/02/22 31.24 kg/m  05/07/22 31.24 kg/m  04/28/22 32.59 kg/m   Wt Readings from Last 4 Encounters:  07/02/22 182 lb (82.6 kg)  07/02/22 182 lb (82.6 kg)  05/07/22 182 lb (82.6 kg)  04/28/22 184 lb (83.5 kg)    Psych/Mental status: Alert,  oriented x 3 (person, place, & time)       Eyes: PERLA Respiratory: No evidence of acute respiratory distress  Cervical Spine Area Exam  Skin & Axial Inspection: No masses, redness, edema, swelling, or associated skin lesions Alignment: Symmetrical Functional ROM: Pain restricted ROM      Stability: No instability detected Muscle Tone/Strength: Functionally intact. No obvious neuro-muscular anomalies detected. Sensory (Neurological): Musculoskeletal pain pattern Palpation: No palpable anomalies              Upper Extremity (UE) Exam    Side: Right upper extremity  Side: Left upper extremity  Skin & Extremity Inspection: Evidence of prior arthroplastic surgery  Skin & Extremity Inspection: Evidence of prior arthroplastic surgery  Functional ROM: Unrestricted ROM          Functional ROM: Unrestricted ROM          Muscle Tone/Strength: Functionally intact. No obvious neuro-muscular anomalies detected.  Muscle Tone/Strength: Functionally intact. No obvious neuro-muscular anomalies detected.  Sensory (Neurological): Unimpaired          Sensory (Neurological): Musculoskeletal pain pattern          Palpation: No palpable anomalies              Palpation: No palpable anomalies              Provocative Test(s):  Phalen's test: deferred Tinel's test: deferred Apley's scratch test (touch opposite shoulder):  Action 1 (Across chest): deferred Action 2 (Overhead): deferred Action 3 (LB reach): deferred   Provocative Test(s):  Phalen's test: deferred Tinel's test: deferred Apley's scratch test (touch opposite shoulder):  Action 1 (Across chest): Decreased ROM Action 2 (Overhead): Decreased ROM Action 3 (LB reach): Decreased ROM    Lumbar Spine Area Exam  Skin & Axial Inspection: No masses, redness, or swelling Alignment: Symmetrical Functional ROM: Unrestricted ROM       Stability: No instability detected Muscle Tone/Strength: Functionally intact. No obvious neuro-muscular anomalies  detected. Sensory (Neurological): Musculoskeletal pain pattern Gait & Posture Assessment  Ambulation: Unassisted Gait: Relatively normal for age and body habitus Posture: WNL  Lower Extremity Exam    Side: Right lower extremity  Side: Left lower extremity  Stability: No instability observed          Stability: No instability observed          Skin & Extremity Inspection:  knee  brace in place  Skin & Extremity Inspection: Skin color, temperature, and hair growth are WNL. No peripheral edema or cyanosis. No masses, redness, swelling, asymmetry, or associated skin lesions. No contractures.  Functional ROM: Pain restricted ROM                  Functional ROM: Unrestricted ROM                  Muscle Tone/Strength: Functionally intact. No obvious neuro-muscular anomalies detected.  Muscle Tone/Strength: Functionally intact. No obvious neuro-muscular anomalies detected.  Sensory (Neurological): Arthropathic arthralgia        Sensory (Neurological): Unimpaired        DTR: Patellar: deferred today Achilles: deferred today Plantar: deferred today  DTR: Patellar: deferred today Achilles: deferred today Plantar: deferred today  Palpation: No palpable anomalies  Palpation: No palpable anomalies    Assessment  Primary Diagnosis & Pertinent Problem List: The primary encounter diagnosis was Left rotator cuff tear arthropathy (hx of left shoulder surgery). Diagnoses of Primary osteoarthritis of right knee, Chronic painful diabetic neuropathy ([place]), Fibromyalgia, Old tear of medial meniscus of right knee, unspecified tear type, and Chronic left shoulder pain were also pertinent to this visit.  Visit Diagnosis (New problems to examiner): 1. Left rotator cuff tear arthropathy (hx of left shoulder surgery)   2. Primary osteoarthritis of right knee   3. Chronic painful diabetic neuropathy ([place])   4. Fibromyalgia   5. Old tear of medial meniscus of right knee, unspecified tear type   6. Chronic left  shoulder pain    Plan of Care (Initial workup plan)  In the context of her having prior left shoulder surgery, we discussed a left diagnostic suprascapular nerve block to help manage her left shoulder pain.  If this is effective, we can further discuss left suprascapular nerve radiofrequency ablation versus suprascapular nerve stimulation.  We also discussed a right knee genicular nerve block for right knee osteoarthritis and medial meniscus tear in the context of having limited response to bracing, NSAIDs, intra-articular steroid injection.  Risks and benefits of both of these procedure were discussed in great detail and patient would like to proceed.  These will be done under fluoroscopy.  I also discussed potential Qutenza treatment for painful diabetic neuropathy of bilateral feet however the patient wants to hold off on that for the time being.  1. Left rotator cuff tear arthropathy (hx of left shoulder surgery) - SUPRASCAPULAR NERVE BLOCK; Future  2. Primary osteoarthritis of right knee - GENICULAR NERVE BLOCK; Future  3. Chronic painful diabetic neuropathy ([place])  4. Fibromyalgia  5. Old tear of medial meniscus of right knee, unspecified tear type - GENICULAR NERVE BLOCK; Future  6. Chronic left shoulder pain - SUPRASCAPULAR NERVE BLOCK; Future   Procedure Orders         SUPRASCAPULAR NERVE BLOCK         GENICULAR NERVE BLOCK     Provider-requested follow-up: Return in about 2 weeks (around 07/16/2022) for Left SSNB + right GNB , in clinic [facility].  Future Appointments  Date Time Provider [place]  08/11/2022  2:45 PM Flora Lipps, MD [facility] None  11/09/2022  1:20 PM Juline Patch, MD [facility]  12/28/2022  2:30 PM PCM NURSE HEALTH ADVISOR [facility]    Duration of encounter: 58mnutes.  Total time on encounter, as per AMA guidelines included both the face-to-face and non-face-to-face time personally spent by the physician and/or other qualified health care  professional(s)  on the day of the encounter (includes time in activities that require the physician or other qualified health care professional and does not include time in activities normally performed by clinical staff). Physician's time may include the following activities when performed: Preparing to see the patient (e.g., pre-charting review of records, searching for previously ordered imaging, lab work, and nerve conduction tests) Review of prior analgesic pharmacotherapies. Reviewing PMP Interpreting ordered tests (e.g., lab work, imaging, nerve conduction tests) Performing post-procedure evaluations, including interpretation of diagnostic procedures Obtaining and/or reviewing separately obtained history Performing a medically appropriate examination and/or evaluation Counseling and educating the patient/family/caregiver Ordering medications, tests, or procedures Referring and communicating with other health care professionals (when not separately reported) Documenting clinical information in the electronic or other health record Independently interpreting results (not separately reported) and communicating results to the patient/ family/caregiver Care coordination (not separately reported)  Note by: Gillis Santa, MD Date: 07/02/2022; Time: 3:37 PM

## 2022-07-07 ENCOUNTER — Encounter: Payer: Self-pay | Admitting: Student in an Organized Health Care Education/Training Program

## 2022-07-07 NOTE — Telephone Encounter (Signed)
Called patient and answered her questions and concerns.

## 2022-07-08 ENCOUNTER — Encounter: Payer: Self-pay | Admitting: Family Medicine

## 2022-07-09 ENCOUNTER — Encounter: Payer: Self-pay | Admitting: Family Medicine

## 2022-07-09 DIAGNOSIS — M9901 Segmental and somatic dysfunction of cervical region: Secondary | ICD-10-CM | POA: Diagnosis not present

## 2022-07-09 DIAGNOSIS — M542 Cervicalgia: Secondary | ICD-10-CM | POA: Diagnosis not present

## 2022-07-09 DIAGNOSIS — R519 Headache, unspecified: Secondary | ICD-10-CM | POA: Diagnosis not present

## 2022-07-09 DIAGNOSIS — M9902 Segmental and somatic dysfunction of thoracic region: Secondary | ICD-10-CM | POA: Diagnosis not present

## 2022-07-10 ENCOUNTER — Encounter: Payer: Self-pay | Admitting: Student in an Organized Health Care Education/Training Program

## 2022-07-10 NOTE — Telephone Encounter (Signed)
Please advise 

## 2022-07-14 ENCOUNTER — Ambulatory Visit
Payer: Medicare HMO | Attending: Student in an Organized Health Care Education/Training Program | Admitting: Student in an Organized Health Care Education/Training Program

## 2022-07-14 ENCOUNTER — Encounter: Payer: Self-pay | Admitting: Student in an Organized Health Care Education/Training Program

## 2022-07-14 ENCOUNTER — Telehealth: Payer: Self-pay | Admitting: Student in an Organized Health Care Education/Training Program

## 2022-07-14 VITALS — BP 180/81 | HR 88 | Temp 97.2°F | Resp 16 | Ht 64.0 in | Wt 180.0 lb

## 2022-07-14 DIAGNOSIS — M1712 Unilateral primary osteoarthritis, left knee: Secondary | ICD-10-CM | POA: Insufficient documentation

## 2022-07-14 DIAGNOSIS — E1140 Type 2 diabetes mellitus with diabetic neuropathy, unspecified: Secondary | ICD-10-CM | POA: Insufficient documentation

## 2022-07-14 DIAGNOSIS — G8929 Other chronic pain: Secondary | ICD-10-CM | POA: Insufficient documentation

## 2022-07-14 DIAGNOSIS — M75102 Unspecified rotator cuff tear or rupture of left shoulder, not specified as traumatic: Secondary | ICD-10-CM | POA: Diagnosis not present

## 2022-07-14 DIAGNOSIS — M797 Fibromyalgia: Secondary | ICD-10-CM

## 2022-07-14 DIAGNOSIS — M25512 Pain in left shoulder: Secondary | ICD-10-CM | POA: Diagnosis not present

## 2022-07-14 DIAGNOSIS — M12812 Other specific arthropathies, not elsewhere classified, left shoulder: Secondary | ICD-10-CM

## 2022-07-14 DIAGNOSIS — M1711 Unilateral primary osteoarthritis, right knee: Secondary | ICD-10-CM | POA: Insufficient documentation

## 2022-07-14 NOTE — Patient Instructions (Addendum)
Patient would like to have bilateral GNB instead of just right GNB Can do bilateral GNB + and left SSNB  ____________________________________________________________________________________________  General Risks and Possible Complications  Patient Responsibilities: It is important that you read this as it is part of your informed consent. It is our duty to inform you of the risks and possible complications associated with treatments offered to you. It is your responsibility as a patient to read this and to ask questions about anything that is not clear or that you believe was not covered in this document.  Patient's Rights: You have the right to refuse treatment. You also have the right to change your mind, even after initially having agreed to have the treatment done. However, under this last option, if you wait until the last second to change your mind, you may be charged for the materials used up to that point.  Introduction: Medicine is not an Chief Strategy Officer. Everything in Medicine, including the lack of treatment(s), carries the potential for danger, harm, or loss (which is by definition: Risk). In Medicine, a complication is a secondary problem, condition, or disease that can aggravate an already existing one. All treatments carry the risk of possible complications. The fact that a side effects or complications occurs, does not imply that the treatment was conducted incorrectly. It must be clearly understood that these can happen even when everything is done following the highest safety standards.  No treatment: You can choose not to proceed with the proposed treatment alternative. The "PRO(s)" would include: avoiding the risk of complications associated with the therapy. The "CON(s)" would include: not getting any of the treatment benefits. These benefits fall under one of three categories: diagnostic; therapeutic; and/or palliative. Diagnostic benefits include: getting information which can  ultimately lead to improvement of the disease or symptom(s). Therapeutic benefits are those associated with the successful treatment of the disease. Finally, palliative benefits are those related to the decrease of the primary symptoms, without necessarily curing the condition (example: decreasing the pain from a flare-up of a chronic condition, such as incurable terminal cancer).  General Risks and Complications: These are associated to most interventional treatments. They can occur alone, or in combination. They fall under one of the following six (6) categories: no benefit or worsening of symptoms; bleeding; infection; nerve damage; allergic reactions; and/or death. No benefits or worsening of symptoms: In Medicine there are no guarantees, only probabilities. No healthcare provider can ever guarantee that a medical treatment will work, they can only state the probability that it may. Furthermore, there is always the possibility that the condition may worsen, either directly, or indirectly, as a consequence of the treatment. Bleeding: This is more common if the patient is taking a blood thinner, either prescription or over the counter (example: Goody Powders, Fish oil, Aspirin, Garlic, etc.), or if suffering a condition associated with impaired coagulation (example: Hemophilia, cirrhosis of the liver, low platelet counts, etc.). However, even if you do not have one on these, it can still happen. If you have any of these conditions, or take one of these drugs, make sure to notify your treating physician. Infection: This is more common in patients with a compromised immune system, either due to disease (example: diabetes, cancer, human immunodeficiency virus [HIV], etc.), or due to medications or treatments (example: therapies used to treat cancer and rheumatological diseases). However, even if you do not have one on these, it can still happen. If you have any of these conditions, or take one of  these drugs,  make sure to notify your treating physician. Nerve Damage: This is more common when the treatment is an invasive one, but it can also happen with the use of medications, such as those used in the treatment of cancer. The damage can occur to small secondary nerves, or to large primary ones, such as those in the spinal cord and brain. This damage may be temporary or permanent and it may lead to impairments that can range from temporary numbness to permanent paralysis and/or brain death. Allergic Reactions: Any time a substance or material comes in contact with our body, there is the possibility of an allergic reaction. These can range from a mild skin rash (contact dermatitis) to a severe systemic reaction (anaphylactic reaction), which can result in death. Death: In general, any medical intervention can result in death, most of the time due to an unforeseen complication. ____________________________________________________________________________________________    ______________________________________________________________________  Preparing for your procedure  During your procedure appointment there will be: No Prescription Refills. No disability issues to discussed. No medication changes or discussions.  Instructions: Food intake: Avoid eating anything solid for at least 8 hours prior to your procedure. Clear liquid intake: You may take clear liquids such as water up to 2 hours prior to your procedure. (No carbonated drinks. No soda.) Transportation: Unless otherwise stated by your physician, bring a driver. Morning Medicines: Except for blood thinners, take all of your other morning medications with a sip of water. Make sure to take your heart and blood pressure medicines. If your blood pressure's lower number is above 100, the case will be rescheduled. Blood thinners: Make sure to stop your blood thinners as instructed.  If you take a blood thinner, but were not instructed to stop it,  call our office [phone] and ask to talk to a nurse. Not stopping a blood thinner prior to certain procedures could lead to serious complications. Diabetics on insulin: Notify the staff so that you can be scheduled 1st case in the morning. If your diabetes requires high dose insulin, take only  of your normal insulin dose the morning of the procedure and notify the staff that you have done so. Preventing infections: Shower with an antibacterial soap the morning of your procedure.  Build-up your immune system: Take 1000 mg of Vitamin C with every meal (3 times a day) the day prior to your procedure. Antibiotics: Inform the nursing staff if you are taking any antibiotics or if you have any conditions that may require antibiotics prior to procedures. (Example: recent joint implants)   Pregnancy: If you are pregnant make sure to notify the nursing staff. Not doing so may result in injury to the fetus, including death.  Sickness: If you have a cold, fever, or any active infections, call and cancel or reschedule your procedure. Receiving steroids while having an infection may result in complications. Arrival: You must be in the facility at least 30 minutes prior to your scheduled procedure. Tardiness: Your scheduled time is also the cutoff time. If you do not arrive at least 15 minutes prior to your procedure, you will be rescheduled.  Children: Do not bring any children with you. Make arrangements to keep them home. Dress appropriately: There is always a possibility that your clothing may get soiled. Avoid long dresses. Valuables: Do not bring any jewelry or valuables.  Reasons to call and reschedule or cancel your procedure: (Following these recommendations will minimize the risk of a serious complication.) Surgeries: Avoid having procedures within  2 weeks of any surgery. (Avoid for 2 weeks before or after any surgery). Flu Shots: Avoid having procedures within 2 weeks of a flu shots or . (Avoid  for 2 weeks before or after immunizations). Barium: Avoid having a procedure within 7-10 days after having had a radiological study involving the use of radiological contrast. (Myelograms, Barium swallow or enema study). Heart attacks: Avoid any elective procedures or surgeries for the initial 6 months after a "Myocardial Infarction" (Heart Attack). Blood thinners: It is imperative that you stop these medications before procedures. Let us know if you if you take any blood thinner.  Infection: Avoid procedures during or within two weeks of an infection (including chest colds or gastrointestinal problems). Symptoms associated with infections include: Localized redness, fever, chills, night sweats or profuse sweating, burning sensation when voiding, cough, congestion, stuffiness, runny nose, sore throat, diarrhea, nausea, vomiting, cold or Flu symptoms, recent or current infections. It is specially important if the infection is over the area that we intend to treat. Heart and lung problems: Symptoms that may suggest an active cardiopulmonary problem include: cough, chest pain, breathing difficulties or shortness of breath, dizziness, ankle swelling, uncontrolled high or unusually low blood pressure, and/or palpitations. If you are experiencing any of these symptoms, cancel your procedure and contact your primary care physician for an evaluation.  Remember:  Regular Business hours are:  Monday to Thursday 8:00 AM to 4:00 PM  Provider's Schedule: Milinda Pointer, MD:  Procedure days: Tuesday and Thursday 7:30 AM to 4:00 PM  Gillis Santa, MD:  Procedure days: Monday and Wednesday 7:30 AM to 4:00 PM  ______________________________________________________________________   ____________________________________________________________________________________________  Genicular Nerve Block  What is a genicular nerve block? A genicular nerve block is the injection of a local anesthetic to block the  nerves that transmits pain from the knee.  What is the purpose of a facet nerve block? A genicular nerve block is a diagnostic procedure to determine if the pathologic changes (i.e. arthritis, meniscal tears, etc) and inflammation within the knee joint is the source of your knee pain. It also confirms that the knee pain will respond well to the actual treatment procedure. If a genicular nerve block works, it will give you relief for several hours. After that, the pain is expected to return to normal. This test is always performed twice (usually a week or two apart) because two successful tests are required to move onto treatment. If both diagnostic tests are positive, then we schedule a treatment called radiofrequency (RF) ablation. In this procedure, the same nerves are cauterized, which typically leads to pain relief for 4 -18 months. If this process works well for one knee, it can be performed on the other knee if needed.  How is the procedure performed? You will be placed on the procedure table. The injection site is sterilized with either iodine or chlorhexadine. The site to be injected is numbed with a local anesthetic, and a needle is directed to the target area. X-ray guidance is used to ensure proper placement and positioning of the needle. When the needle is properly positioned near the genicular nerve, local anesthetic is injected to numb that nerve. This will be repeated at multiple sites around the knee to block all genicular nerves.  Will the procedure be painful? The injection can be painful and we therefore provide the option of receiving IV sedation. IV sedation, combined with local anesthetic, can make the injection nearly pain free. It allows you to remain very still  during the procedure, which can also make the injection easier, faster, and more successful. If you decide to have IV sedation, you must have a driver to get you home safely afterwards. In addition, you cannot have anything  to eat or drink within 8 hours of your appointment (clear liquids are allowed until 3 hours before the procedure). If you take medications for diabetes, these medications may need to be adjusted the morning of the procedure. Your primary care physician can help you with this adjustment.  What are the discharge instructions? If you received IV sedation do not drive or operate machinery for at least 24 hours after the procedure. You may return to work the next day following your procedure. You may resume your normal diet immediately. Do not engage in any strenuous activity for 24 hours. You should, however, engage in moderate activity that typically causes your ususal pain. If the block works, those activities should not be painful for several hours after the injection. Do not take a bath, swim, or use a hot tub for 24 hours (you may take a shower). Call the office if you have any of the following: severe pain afterwards (different than your usual symptoms), redness/swelling/discharge at the injection site(s), fevers/chills, difficulty with bowel or bladder functions.  What are the risks and side effects? The complication rate for this procedure is very low. Whenever a needle enters the skin, bleeding or infection can occur. Some other serious but extremely rare risks include paralysis and death. You may have an allergic reaction to any of the medications used. If you have a known allergy to any medications, especially local anesthetics, notify our staff before the procedure takes place. You may experience any of the following side effects up to 4 - 6 hours after the procedure: Leg muscle weakness or numbness may occur due to the local anesthetic affecting the nerves that control your legs (this is a temporary affect and it is not paralysis). If you have any leg weakness or numbness, walk only with assistance in order to prevent falls and injury. Your leg strength will return slowly and completely. Dizziness  may occur due to a decrease in your blood pressure. If this occurs, remain in a seated or lying position. Gradually sit up, and then stand after at least 10 minutes of sitting. Mild headaches may occur. Drink fluids and take pain medications if needed. If the headaches persist or become severe, call the office. Mild discomfort at the injection site can occur. This typically lasts for a few hours but can persist for a couple days. If this occurs, take anti-inflammatories or pain medications, apply ice to the area the day of the procedure. If it persists, apply moist heat in the day(s) following.  The side effects listed above can be normal. They are not dangerous and will resolve on their own. If, however, you experience any of the following, a complication may have occurred and you should either contact your doctor. If he is not readily available, then you should proceed to the closest urgent care center for evaluation: Severe or progressive pain at the injection site(s) Arm or leg weakness that progressively worsens or persists for longer than 8 hours Severe or progressive redness, swelling, or discharge from the injections site(s) Fevers, chills, nausea, or vomiting Bowel or bladder dysfunction (i.e. inability to urinate or pass stool or difficulty controlling either)  How long does it take for the procedure to work? You should feel relief from your usual pain within  the first hour. Again, this is only expected to last for several hours, at the most. Remember, you may be sore in the middle part of your back from the needles, and you must distinguish this from your usual pain. ____________________________________________________________________________________________

## 2022-07-14 NOTE — Telephone Encounter (Signed)
PT stated that she takes Sudafed everyday that's what is causing her blood pressure to be high. PT wants to know exactly how long will the procedure takes. Please give patient a call. TY

## 2022-07-14 NOTE — Progress Notes (Signed)
Safety precautions to be maintained throughout the outpatient stay will include: orient to surroundings, keep bed in low position, maintain call bell within reach at all times, provide assistance with transfer out of bed and ambulation.  

## 2022-07-14 NOTE — Progress Notes (Signed)
PROVIDER NOTE: Information contained herein reflects review and annotations entered in association with encounter. Interpretation of such information and data should be left to medically-trained personnel. Information provided to patient can be located elsewhere in the medical record under "Patient Instructions". Document created using STT-dictation technology, any transcriptional errors that may result from process are unintentional.    Patient: Sharon Kaufman  Service Category: E/M  Provider: Gillis Santa, MD  DOB: 1950/07/27  DOS: 07/14/2022  Referring Provider: Juline Patch, MD  MRN: [id number]  Specialty: Interventional Pain Management  PCP: Juline Patch, MD  Type: Established Patient  Setting: Ambulatory outpatient    Location: Office  Delivery: Face-to-face     HPI  Sharon Kaufman, a 72 y.o. year old female, is here today because of her Left rotator cuff tear arthropathy [M75.102, M12.812]. Sharon Kaufman's primary complain today is Knee Pain (Bilateral ), Shoulder Pain (Left ), and Neck Pain (Left ) Last encounter: My last encounter with her was on 07/02/2022.  Pain Assessment: Severity of Chronic pain is reported as a 7 /10. Location: Knee (see visit info for other pain sites.) Left, Right/shoulder pain into left neck. Onset: More than a month ago. Quality: Discomfort, Constant, Headache, Throbbing, Spasm, Other (Comment), Sore, Sharp (swelling). Timing: Constant. Modifying factor(s): rest, heat, ANSAIDS. Vitals:  height is 5' 4"$  (1.626 m) and weight is 180 lb (81.6 kg). Her temporal temperature is 97.2 F (36.2 C) (abnormal). Her blood pressure is 180/81 (abnormal) and her pulse is 88. Her respiration is 16 and oxygen saturation is 99%.  BMI: Estimated body mass index is 30.9 kg/m as calculated from the following:   Height as of this encounter: 5' 4"$  (1.626 m).   Weight as of this encounter: 180 lb (81.6 kg).  Reason for encounter: patient-requested evaluation.   Patient is  scheduled for a left suprascapular nerve block and right genicular nerve block in early March.  She states that she is also experiencing left knee pain and is wondering if she can have a left genicular nerve block on the same day that she has a right genicular nerve block.  I informed her that was okay.  Of note, the patient had a headache and was somewhat nauseous when she came in.  Her systolic blood pressure was in the 180s.  I encouraged her to follow-up with her primary care provider for blood pressure management.   ROS  Constitutional: Denies any fever or chills Gastrointestinal: No reported hemesis, hematochezia, vomiting, or acute GI distress Musculoskeletal:  Left shoulder pain, bilateral knee pain, left cervical pain Neurological: No reported episodes of acute onset apraxia, aphasia, dysarthria, agnosia, amnesia, paralysis, loss of coordination, or loss of consciousness  Medication Review  Accu-Chek FastClix Lancets, Bioflavonoid Products, Biotin, Calcium-Vitamin D, Carboxymethylcellul-Glycerin, Diclofenac Sodium CR, Famotidine, Lysine, Magnesium, Semaglutide(0.25 or 0.5MG/DOS), Vitamin D (Ergocalciferol), acetaminophen, alendronate, aspirin EC, carbamazepine, cetirizine, co-enzyme Q-10, ezetimibe, glucose blood, ibuprofen, lisinopril, meloxicam, metFORMIN, pseudoephedrine, vitamin C, vitamin E, zinc gluconate, and zolpidem  History Review  Allergy: Sharon Kaufman is allergic to quetiapine, alendronate, atorvastatin, codeine, pravastatin, risedronate, hydrocodone-acetaminophen, and amitriptyline. Drug: Sharon Kaufman  reports no history of drug use. Alcohol:  reports current alcohol use of about 2.0 standard drinks of alcohol per week. Tobacco:  reports that she quit smoking about 16 years ago. Her smoking use included cigarettes. She has never used smokeless tobacco. Social: Sharon Kaufman  reports that she quit smoking about 16 years ago. Her smoking use included cigarettes. She has never  used  smokeless tobacco. She reports current alcohol use of about 2.0 standard drinks of alcohol per week. She reports that she does not use drugs. Medical:  has a past medical history of Allergy, Anxiety, Breast cancer ([place]), Breast cancer, left ([place]) (2008), Diabetes mellitus without complication ([place]), GERD (gastroesophageal reflux disease), Hyperlipidemia, Hypertension, and Personal history of radiation therapy (2008). Surgical: Sharon Kaufman  has a past surgical history that includes Splenectomy (1973); Appendectomy (2009); Breast lumpectomy (Left, 2008); Ankle Fusion (Left, 2009); Rotator cuff repair (Left, 2011); Total shoulder replacement (Right, 2019); and Reduction mammaplasty (Right, 2014). Family: family history includes Alcohol abuse in her father; Liver disease in her father; Rheum arthritis in her mother.  Laboratory Chemistry Profile   Renal Lab Results  Component Value Date   BUN 30 (H) 04/28/2022   CREATININE 0.99 04/28/2022   BCR 30 (H) 04/28/2022    Hepatic Lab Results  Component Value Date   AST 24 01/02/2022   ALT 20 01/02/2022   ALBUMIN 4.4 01/02/2022   ALKPHOS 87 01/02/2022    Electrolytes Lab Results  Component Value Date   NA 131 (L) 04/28/2022   K 5.5 (H) 04/28/2022   CL 98 04/28/2022   CALCIUM 9.3 04/28/2022   PHOS 2.7 (L) 12/02/2021    Bone Lab Results  Component Value Date   VD25OH 48.6 04/28/2022    Inflammation (CRP: Acute Phase) (ESR: Chronic Phase) No results found for: "CRP", "ESRSEDRATE", "LATICACIDVEN"       Note: Above Lab results reviewed.  Recent Imaging Review  CT CHEST NODULE FOLLOW UP WO CONTRAST CLINICAL DATA:  Right lower lobe finding on MRI.  EXAM: CT CHEST WITHOUT CONTRAST  TECHNIQUE: Multidetector CT imaging of the chest was performed following the standard protocol without IV contrast.  RADIATION DOSE REDUCTION: This exam was performed according to the departmental dose-optimization program which includes automated exposure  control, adjustment of the mA and/or kV according to patient size and/or use of iterative reconstruction technique.  COMPARISON:  MRI abdomen dated June 01, 2022  FINDINGS: Cardiovascular: Normal heart size. No pericardial effusion. Normal caliber thoracic aorta with moderate calcified plaque.  Mediastinum/Nodes: Esophagus and thyroid are unremarkable. No pathologically enlarged lymph nodes seen in the chest.  Lungs/Pleura: Central airways are patent. Solid pulmonary nodule of the right lower lobe measuring 1.5 x 1.0 cm with adjacent bronchiectasis. No pleural effusion or pneumothorax.  Upper Abdomen: Subcapsular lesion of the posterior right lobe of the liver, see prior MRI for better characterization. Cirrhotic liver morphology.  Musculoskeletal: No chest wall mass or suspicious bone lesions identified.  IMPRESSION: 1. Solid pulmonary nodule of the right lower lobe measuring up to 1.5 cm. Consider one of the following in 3 months for both low-risk and high-risk individuals: (a) repeat chest CT, (b) follow-up PET-CT, or (c) tissue sampling. This recommendation follows the consensus statement: Guidelines for Management of Incidental Pulmonary Nodules Detected on CT Images: From the Fleischner Society 2017; Radiology 2017; 284:228-243. 2. Aortic Atherosclerosis (ICD10-I70.0).  Electronically Signed   By: Yetta Glassman M.D.   On: 06/29/2022 09:15 Note: Reviewed        Physical Exam  General appearance: Well nourished, well developed, and well hydrated. In no apparent acute distress Mental status: Alert, oriented x 3 (person, place, & time)       Respiratory: No evidence of acute respiratory distress Eyes: PERLA Vitals: BP (!) 180/81 (BP Location: Right Arm, Patient Position: Sitting, Cuff Size: Normal)   Pulse 88   Temp Marland Kitchen)  97.2 F (36.2 C) (Temporal)   Resp 16   Ht 5' 4"$  (1.626 m)   Wt 180 lb (81.6 kg)   SpO2 99%   BMI 30.90 kg/m  BMI: Estimated body mass  index is 30.9 kg/m as calculated from the following:   Height as of this encounter: 5' 4"$  (1.626 m).   Weight as of this encounter: 180 lb (81.6 kg). Ideal: Ideal body weight: 54.7 kg (120 lb 9.5 oz) Adjusted ideal body weight: 65.5 kg (144 lb 5.7 oz)  Assessment   Diagnosis Status  1. Left rotator cuff tear arthropathy (hx of left shoulder surgery)   2. Primary osteoarthritis of right knee   3. Arthritis of left knee   4. Chronic painful diabetic neuropathy ([place])   5. Fibromyalgia   6. Chronic left shoulder pain    Persistent Persistent Persistent   Updated Problems: No problems updated.  Plan of Care  Okay to do bilateral genicular nerve block when she follows up in early March for her procedures.  Will continue with left suprascapular nerve block as well.  Encourage patient to follow-up with primary care provider to optimize blood pressure management.  Follow-up plan:   Return for Keep sch. appt.      Recent Visits Date Type Provider Dept  07/02/22 Office Visit Gillis Santa, MD [facility]  Showing recent visits within past 90 days and meeting all other requirements Today's Visits Date Type Provider Dept  07/14/22 Office Visit Gillis Santa, MD [facility]  Showing today's visits and meeting all other requirements Future Appointments Date Type Provider Dept  07/27/22 Appointment Gillis Santa, MD [facility]  Showing future appointments within next 90 days and meeting all other requirements  I discussed the assessment and treatment plan with the patient. The patient was provided an opportunity to ask questions and all were answered. The patient agreed with the plan and demonstrated an understanding of the instructions.  Patient advised to call back or seek an in-person evaluation if the symptoms or condition worsens.  Duration of encounter: 55mnutes.  Total time on encounter, as per AMA guidelines included both the face-to-face and  non-face-to-face time personally spent by the physician and/or other qualified health care professional(s) on the day of the encounter (includes time in activities that require the physician or other qualified health care professional and does not include time in activities normally performed by clinical staff). Physician's time may include the following activities when performed: Preparing to see the patient (e.g., pre-charting review of records, searching for previously ordered imaging, lab work, and nerve conduction tests) Review of prior analgesic pharmacotherapies. Reviewing PMP Interpreting ordered tests (e.g., lab work, imaging, nerve conduction tests) Performing post-procedure evaluations, including interpretation of diagnostic procedures Obtaining and/or reviewing separately obtained history Performing a medically appropriate examination and/or evaluation Counseling and educating the patient/family/caregiver Ordering medications, tests, or procedures Referring and communicating with other health care professionals (when not separately reported) Documenting clinical information in the electronic or other health record Independently interpreting results (not separately reported) and communicating results to the patient/ family/caregiver Care coordination (not separately reported)  Note by: BGillis Santa MD Date: 07/14/2022; Time: 3:35 PM

## 2022-07-15 NOTE — Telephone Encounter (Signed)
All questions answered

## 2022-07-27 ENCOUNTER — Ambulatory Visit: Payer: Medicare HMO | Admitting: Student in an Organized Health Care Education/Training Program

## 2022-07-29 DIAGNOSIS — M542 Cervicalgia: Secondary | ICD-10-CM | POA: Diagnosis not present

## 2022-07-29 DIAGNOSIS — M9901 Segmental and somatic dysfunction of cervical region: Secondary | ICD-10-CM | POA: Diagnosis not present

## 2022-07-29 DIAGNOSIS — R519 Headache, unspecified: Secondary | ICD-10-CM | POA: Diagnosis not present

## 2022-07-29 DIAGNOSIS — M9902 Segmental and somatic dysfunction of thoracic region: Secondary | ICD-10-CM | POA: Diagnosis not present

## 2022-08-03 ENCOUNTER — Encounter: Payer: Self-pay | Admitting: Family Medicine

## 2022-08-03 ENCOUNTER — Encounter: Payer: Self-pay | Admitting: Student in an Organized Health Care Education/Training Program

## 2022-08-03 NOTE — Telephone Encounter (Signed)
Please advise 

## 2022-08-06 ENCOUNTER — Ambulatory Visit (INDEPENDENT_AMBULATORY_CARE_PROVIDER_SITE_OTHER): Payer: Medicare HMO | Admitting: Family Medicine

## 2022-08-06 ENCOUNTER — Encounter: Payer: Self-pay | Admitting: Family Medicine

## 2022-08-06 VITALS — BP 142/90 | HR 97 | Ht 64.0 in | Wt 174.0 lb

## 2022-08-06 DIAGNOSIS — R21 Rash and other nonspecific skin eruption: Secondary | ICD-10-CM | POA: Diagnosis not present

## 2022-08-06 DIAGNOSIS — E7800 Pure hypercholesterolemia, unspecified: Secondary | ICD-10-CM

## 2022-08-06 DIAGNOSIS — I10 Essential (primary) hypertension: Secondary | ICD-10-CM | POA: Diagnosis not present

## 2022-08-06 NOTE — Patient Instructions (Addendum)

## 2022-08-06 NOTE — Progress Notes (Signed)
Date:  08/06/2022   Name:  Sharon Kaufman   DOB:  April 28, 1951   MRN:  [id number]   Chief Complaint: No chief complaint on file.  Rash This is a recurrent problem. The current episode started in the past 7 days (3-4 days ago). Progression since onset: come and go. The affected locations include the left arm and right arm (forearms). Rash characteristics: ecchymotic appearance. Associated symptoms include fatigue and joint pain. Pertinent negatives include no fever or shortness of breath.    Lab Results  Component Value Date   NA 131 (L) 04/28/2022   K 5.5 (H) 04/28/2022   CO2 17 (L) 04/28/2022   GLUCOSE 138 (H) 04/28/2022   BUN 30 (H) 04/28/2022   CREATININE 0.99 04/28/2022   CALCIUM 9.3 04/28/2022   EGFR 62 04/28/2022   Lab Results  Component Value Date   CHOL 216 (H) 01/02/2022   HDL 80 01/02/2022   LDLCALC 123 (H) 01/02/2022   TRIG 76 01/02/2022   No results found for: "TSH" Lab Results  Component Value Date   HGBA1C 5.9 (H) 01/02/2022   Lab Results  Component Value Date   WBC 11.5 (H) 02/13/2022   HGB 13.3 02/13/2022   HCT 39.6 02/13/2022   MCV 91.2 02/13/2022   PLT 376 02/13/2022   Lab Results  Component Value Date   ALT 20 01/02/2022   AST 24 01/02/2022   ALKPHOS 87 01/02/2022   BILITOT <0.2 01/02/2022   Lab Results  Component Value Date   VD25OH 48.6 04/28/2022     Review of Systems  Constitutional:  Positive for fatigue. Negative for chills and fever.  HENT:  Negative for trouble swallowing.   Eyes:  Negative for visual disturbance.  Respiratory:  Negative for shortness of breath.   Cardiovascular:  Negative for palpitations.  Gastrointestinal:  Negative for abdominal pain, blood in stool and constipation.  Musculoskeletal:  Positive for joint pain.  Skin:  Positive for rash.    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) 07/02/2022   Chronic painful diabetic  neuropathy ([place]) 07/02/2022   Old tear of medial meniscus of right knee 07/02/2022   Abnormal echocardiogram 03/21/2022   Pes anserinus bursitis of right knee 03/17/2022   Stenosing tenosynovitis of finger of right hand 02/10/2022   Peroneal tendinitis, right 10/03/2021   Positive ANA (antinuclear antibody) 08/22/2021   Chronic musculoskeletal pain 08/05/2021   Osteoarthritis (arthritis due to wear and tear of joints) 07/29/2021   Osteoarthritis of right knee 07/29/2021   Plantar fasciitis, right 07/29/2021   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   Statin intolerance 03/21/2021   Acquired trigger finger of right middle finger 03/10/2021   Ankylosis of ankle joint, left 03/10/2021   Cirrhosis ([place]) 07/25/2020   Bronchiectasis without complication ([place]) [id number]   Deviated septum 03/22/2020   Allergic rhinitis due to pollen 03/22/2020   Overweight (BMI 25.0-29.9) 12/06/2019   History of cardiovascular stress test [id number]   Diastolic dysfunction without heart failure 10/02/2019   Dyspareunia in female 08/02/2019   Cataract, nuclear sclerotic senile, right 07/24/2019   Fibromyalgia 07/11/2019   History of hepatitis C 07/11/2019   Rheumatoid factor positive 06/06/2019   Other insomnia 06/06/2019   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   Essential hypertension 06/06/2019   Diabetes type 2, controlled ([place]) 06/06/2019   Anxiety  06/06/2019   Age-related osteoporosis without current pathological fracture 01/03/2013   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/2012    Allergies  Allergen Reactions   Quetiapine Rash   Alendronate Other (See Comments)    dyspepsia Other reaction(s): Other dyspepsia dyspepsia Other reaction(s): Other dyspepsia  Other reaction(s): Other dyspepsia  dyspepsia   Atorvastatin Other (See Comments)    Other reaction(s): Other myalgia myalgia  Other reaction(s): Other myalgia  myalgia  Other reaction(s): Other myalgia  myalgia   Codeine Itching and Nausea Only   Pravastatin Other (See Comments)    myalgias   Risedronate Other (See Comments)    dyspepsia   Hydrocodone-Acetaminophen    Amitriptyline Rash    Past Surgical History:  Procedure Laterality Date   ANKLE FUSION Left 2009   APPENDECTOMY  2009   BREAST LUMPECTOMY Left 2008   REDUCTION MAMMAPLASTY Right 2014   ROTATOR CUFF REPAIR Left 2011   SPLENECTOMY  1973   TOTAL SHOULDER REPLACEMENT Right 2019    Social History   Tobacco Use   Smoking status: Former    Types: Cigarettes    Quit date: 2008    Years since quitting: 16.2   Smokeless tobacco: Never  Vaping Use   Vaping Use: Never used  Substance Use Topics   Alcohol use: Yes    Alcohol/week: 2.0 standard drinks of alcohol    Types: 2 Glasses of wine per week   Drug use: Never     Medication list has been reviewed and updated.  Current Meds  Medication Sig   Accu-Chek FastClix Lancets MISC 1 each by Other route daily.   ACCU-CHEK GUIDE test strip USE 1 DAILY.   acetaminophen (TYLENOL) 650 MG CR tablet Take 650 mg by mouth every 8 (eight) hours as needed for pain.   alendronate (FOSAMAX) 70 MG tablet Take 1 tablet (70 mg total) by mouth every 7 (seven) days.   Ascorbic Acid (VITAMIN C) 1000 MG tablet Take 1,000 mg by mouth daily.   aspirin 81 MG EC tablet Take 81 mg by mouth daily.   Bioflavonoid Products (QUERCETIN COMPLEX IMMUNE PO) Take by mouth.   Biotin 10 MG CAPS Take by mouth.   CALCIUM-VITAMIN D PO Take 1 tablet by mouth daily.   Carboxymethylcellul-Glycerin (REFRESH RELIEVA OP) Apply 1 drop to eye daily.   co-enzyme Q-10 30 MG capsule Take 30 mg by mouth 3 (three) times daily.   Diclofenac Sodium CR 100 MG 24 hr tablet Take 1 tablet (100 mg total) by mouth daily.   ezetimibe (ZETIA) 10 MG tablet Take 1 tablet (10 mg total) by mouth daily.   Famotidine (PEPCID PO) Take by mouth.   lisinopril (ZESTRIL) 2.5 MG tablet  Take 1 tablet (2.5 mg total) by mouth daily.   LYSINE PO Take 1 tablet by mouth daily.   MAGNESIUM PO Take by mouth at bedtime.   metFORMIN (GLUCOPHAGE-XR) 750 MG 24 hr tablet TAKE 2 TABLETS EVERY DAY WITH BREAKFAST   pseudoephedrine (SUDAFED) 120 MG 12 hr tablet Take 120 mg by mouth 2 (two) times daily.   Semaglutide,0.25 or 0.'5MG'$ /DOS, 2 MG/3ML SOPN Inject into the skin.   vitamin E 1000 UNIT capsule Take 1,000 Units by mouth daily.   zinc gluconate 50 MG tablet Take 50 mg by mouth daily.   zolpidem (AMBIEN) 5 MG tablet Take 5 mg by mouth at bedtime. Psych       08/06/2022    1:25 PM 07/02/2022   10:01 AM 05/07/2022  3:20 PM 02/13/2022    3:36 PM  GAD 7 : Generalized Anxiety Score  Nervous, Anxious, on Edge 0 0 0 0  Control/stop worrying 0 0 0 0  Worry too much - different things 0 0 0 0  Trouble relaxing 0 0 0 0  Restless 0 0 0 0  Easily annoyed or irritable 0 0 0 0  Afraid - awful might happen 0 0 0 0  Total GAD 7 Score 0 0 0 0  Anxiety Difficulty Not difficult at all Not difficult at all Not difficult at all Not difficult at all       08/06/2022    1:25 PM 07/02/2022   10:01 AM 05/07/2022    3:20 PM  Depression screen PHQ 2/9  Decreased Interest 0  0  Down, Depressed, Hopeless 0 0 0  PHQ - 2 Score 0 0 0  Altered sleeping 0 0 0  Tired, decreased energy 0 0 0  Change in appetite 0 0 0  Feeling bad or failure about yourself  0 0 0  Trouble concentrating 0 0 0  Moving slowly or fidgety/restless 0 0 0  Suicidal thoughts 0 0 0  PHQ-9 Score 0 0 0  Difficult doing work/chores Not difficult at all Not difficult at all Not difficult at all    BP Readings from Last 3 Encounters:  08/06/22 (!) 144/98  07/14/22 (!) 180/81  07/02/22 (!) 159/91    Physical Exam Vitals and nursing note reviewed.  Cardiovascular:     Heart sounds: No murmur heard.    No friction rub. No gallop.  Pulmonary:     Breath sounds: No wheezing, rhonchi or rales.  Abdominal:     Palpations:  Abdomen is soft.  Skin:    Findings: Bruising present.  Neurological:     Mental Status: She is alert.     Wt Readings from Last 3 Encounters:  08/06/22 174 lb (78.9 kg)  07/14/22 180 lb (81.6 kg)  07/02/22 182 lb (82.6 kg)    BP (!) 144/98   Pulse 97   Ht '5\' 4"'$  (1.626 m)   Wt 174 lb (78.9 kg)   SpO2 97%   BMI 29.87 kg/m   Assessment and Plan:  1. Rash Onset about 6 months ago.  With the appearance of ecchymosis/?  Purpura always on the left or right forearms.  Fade and then new areas, every 1 to 2 weeks.  Review of medications that I prescribe include metformin which may be discontinued by her endocrinologist in the future, Zetia which I have suggested to the patient she may discontinue but is going to adversely affect her lipids since she is statin intolerant and lisinopril 2.5 milligrams for diabetic nephropathy control. - Ambulatory referral to Dermatology in the meantime we will take a look at PT PTT given that patient has a history of cirrhosis, Lyme disease and that patient has a history of tick exposure, since it has been 6 months repeat a platelet count, and microscopic urinalysis to rule out any kidney disease which may contribute to a purpuric type concern - PT and PTT - Lyme Disease Serology w/Reflex - Platelet count - Urinalysis, microscopic only  2. Essential hypertension Chronic.  Currently not under control but these are the first readings that have been consistently elevated beginning in February 2024.  Patient has doctors also involved including an endocrinologist for her [facility].  I will encouraged that we will need to recheck  3. Hypercholesterolemia Patient is aware that  she cannot take statins and I have suggested if she is having some issues with the Zetia she can stop this and we can recheck it she will give it some consideration.   Otilio Miu, MD

## 2022-08-07 ENCOUNTER — Other Ambulatory Visit: Payer: Self-pay | Admitting: Family Medicine

## 2022-08-07 LAB — LYME DISEASE SEROLOGY W/REFLEX: Lyme Total Antibody EIA: NEGATIVE

## 2022-08-07 LAB — PT AND PTT
INR: 1 (ref 0.9–1.2)
Prothrombin Time: 11 s (ref 9.1–12.0)
aPTT: 30 s (ref 24–33)

## 2022-08-07 LAB — PLATELET COUNT: Platelets: 393 10*3/uL (ref 150–450)

## 2022-08-07 LAB — URINALYSIS, MICROSCOPIC ONLY
Bacteria, UA: NONE SEEN
Casts: NONE SEEN /lpf
WBC, UA: NONE SEEN /hpf (ref 0–5)

## 2022-08-07 MED ORDER — DICLOFENAC SODIUM 75 MG PO TBEC: 75.0000 mg | DELAYED_RELEASE_TABLET | Freq: Two times a day (BID) | ORAL | 0 refills | Status: AC | PRN

## 2022-08-07 NOTE — Telephone Encounter (Signed)
Please advise 

## 2022-08-07 NOTE — Telephone Encounter (Signed)
fyi

## 2022-08-10 DIAGNOSIS — H35073 Retinal telangiectasis, bilateral: Secondary | ICD-10-CM | POA: Diagnosis not present

## 2022-08-10 DIAGNOSIS — H0288B Meibomian gland dysfunction left eye, upper and lower eyelids: Secondary | ICD-10-CM | POA: Diagnosis not present

## 2022-08-10 DIAGNOSIS — H35372 Puckering of macula, left eye: Secondary | ICD-10-CM | POA: Diagnosis not present

## 2022-08-10 DIAGNOSIS — H16223 Keratoconjunctivitis sicca, not specified as Sjogren's, bilateral: Secondary | ICD-10-CM | POA: Diagnosis not present

## 2022-08-10 DIAGNOSIS — I10 Essential (primary) hypertension: Secondary | ICD-10-CM | POA: Diagnosis not present

## 2022-08-10 DIAGNOSIS — D485 Neoplasm of uncertain behavior of skin: Secondary | ICD-10-CM | POA: Diagnosis not present

## 2022-08-10 DIAGNOSIS — E119 Type 2 diabetes mellitus without complications: Secondary | ICD-10-CM | POA: Diagnosis not present

## 2022-08-10 DIAGNOSIS — H0288A Meibomian gland dysfunction right eye, upper and lower eyelids: Secondary | ICD-10-CM | POA: Diagnosis not present

## 2022-08-10 DIAGNOSIS — H40013 Open angle with borderline findings, low risk, bilateral: Secondary | ICD-10-CM | POA: Diagnosis not present

## 2022-08-10 DIAGNOSIS — H524 Presbyopia: Secondary | ICD-10-CM | POA: Diagnosis not present

## 2022-08-10 DIAGNOSIS — H35033 Hypertensive retinopathy, bilateral: Secondary | ICD-10-CM | POA: Diagnosis not present

## 2022-08-10 DIAGNOSIS — Z0100 Encounter for examination of eyes and vision without abnormal findings: Secondary | ICD-10-CM | POA: Diagnosis not present

## 2022-08-11 ENCOUNTER — Ambulatory Visit (INDEPENDENT_AMBULATORY_CARE_PROVIDER_SITE_OTHER): Payer: Medicare HMO | Admitting: Internal Medicine

## 2022-08-11 ENCOUNTER — Encounter: Payer: Self-pay | Admitting: Internal Medicine

## 2022-08-11 VITALS — BP 138/82 | HR 91 | Temp 97.6°F | Ht 63.5 in | Wt 176.6 lb

## 2022-08-11 DIAGNOSIS — R911 Solitary pulmonary nodule: Secondary | ICD-10-CM

## 2022-08-11 NOTE — Progress Notes (Signed)
[facility] Consultation      Date: 08/11/2022,   MRN# [id number] HALIEE RACETTE 1950/08/29     CHIEF COMPLAINT:   Assessment for abnormal CT scan   HISTORY OF PRESENT ILLNESS   72 year old pleasant white female seen today for abnormal CT chest CT scan reviewed in detail with patient from February 2024  Patient underwent MRI of the liver to assess for nonalcoholic steatosis of the liver Incidental finding in the right lower lobe noted  Follow-up CT scans of the chest reviewed  Patient has a 1.5 cm nodular opacification in the right lower lobe At the time of scan, patient may have had symptoms of sinus infection and was given antibiotics   No exacerbation at this time No evidence of heart failure at this time No evidence or signs of infection at this time No respiratory distress No fevers, chills, nausea, vomiting, diarrhea No evidence of lower extremity edema No evidence hemoptysis    PAST MEDICAL HISTORY   Past Medical History:  Diagnosis Date   Allergy    Anxiety    Breast cancer ([place])    Breast cancer, left ([place]) 2008   Diabetes mellitus without complication ([place])    GERD (gastroesophageal reflux disease)    Hyperlipidemia    Hypertension    Personal history of radiation therapy 2008   Left Breast Cancer     SURGICAL HISTORY   Past Surgical History:  Procedure Laterality Date   ANKLE FUSION Left 2009   APPENDECTOMY  2009   BREAST LUMPECTOMY Left 2008   REDUCTION MAMMAPLASTY Right 2014   ROTATOR CUFF REPAIR Left 2011   SPLENECTOMY  1973   TOTAL SHOULDER REPLACEMENT Right 2019     FAMILY HISTORY   Family History  Problem Relation Age of Onset   Rheum arthritis Mother    Alcohol abuse Father    Liver disease Father      SOCIAL HISTORY   Social History   Tobacco Use   Smoking status: Former    Types: Cigarettes    Quit date: 2008    Years since quitting: 16.2   Smokeless tobacco: Never  Vaping Use   Vaping  Use: Never used  Substance Use Topics   Alcohol use: Yes    Alcohol/week: 2.0 standard drinks of alcohol    Types: 2 Glasses of wine per week   Drug use: Never     MEDICATIONS    Home Medication:  Current Outpatient Rx   Order #: [id number] Class: Historical Med   Order #: [id number] Class: Normal   Order #: [id number] Class: Historical Med   Order #: [id number] Class: Historical Med   Order #: [id number] Class: Historical Med   Order #: [id number] Class: Historical Med   Order #: [id number] Class: Historical Med   Order #: [id number] Class: Historical Med   Order #: [id number] Class: Historical Med   Order #: [id number] Class: Historical Med   Order #: [id number] Class: Historical Med   Order #: [id number] Class: Normal   Order #: [id number] Class: Normal   Order #: [id number] Class: Historical Med   Order #: [id number] Class: Historical Med   Order #: [id number] Class: No Print   Order #: [id number] Class: Historical Med   Order #: [id number] Class: Historical Med   Order #: [id number] Class: Historical Med   Order #: [id number] Class: Normal   Order #: [id number] Class: Historical Med   Order #: [id number] Class: Historical Med   Order #: [id number] Class: Normal   Order #: [id number] Class: Historical Med   Order #: [id number] Class: Historical Med   Order #: [id number] Class:  Historical Med   Order #: [id number] Class: Historical Med    Current Medication:  Current Outpatient Medications:    Accu-Chek FastClix Lancets MISC, 1 each by Other route daily., Disp: , Rfl:    ACCU-CHEK GUIDE test strip, USE 1 DAILY., Disp: 100 strip, Rfl: 1   acetaminophen (TYLENOL) 650 MG CR tablet, Take 650 mg by mouth every 8 (eight) hours as needed for pain., Disp: , Rfl:    Ascorbic Acid (VITAMIN C) 1000 MG tablet, Take 1,000 mg by mouth daily., Disp: , Rfl:    aspirin 81 MG EC tablet, Take 81 mg by mouth daily., Disp: , Rfl:    Bioflavonoid Products (QUERCETIN COMPLEX IMMUNE PO), Take by mouth., Disp: , Rfl:    Biotin 10 MG CAPS,  Take by mouth., Disp: , Rfl:    CALCIUM-VITAMIN D PO, Take 1 tablet by mouth daily., Disp: , Rfl:    Carboxymethylcellul-Glycerin (REFRESH RELIEVA OP), Apply 1 drop to eye daily., Disp: , Rfl:    cetirizine (ZYRTEC) 10 MG tablet, Take 10 mg by mouth daily., Disp: , Rfl:    co-enzyme Q-10 30 MG capsule, Take 30 mg by mouth 3 (three) times daily., Disp: , Rfl:    diclofenac (VOLTAREN) 75 MG EC tablet, Take 1 tablet (75 mg total) by mouth 2 (two) times daily as needed for up to 15 days., Disp: 30 tablet, Rfl: 0   Diclofenac Sodium CR 100 MG 24 hr tablet, Take 1 tablet (100 mg total) by mouth daily. (Patient taking differently: Take 100 mg by mouth 2 (two) times daily.), Disp: 30 tablet, Rfl: 2   Famotidine (PEPCID PO), Take by mouth., Disp: , Rfl:    ibuprofen (ADVIL) 800 MG tablet, Take 800 mg by mouth every 8 (eight) hours as needed., Disp: , Rfl:    lisinopril (ZESTRIL) 2.5 MG tablet, Take 1 tablet (2.5 mg total) by mouth daily., Disp: 90 tablet, Rfl: 1   LYSINE PO, Take 1 tablet by mouth daily., Disp: , Rfl:    MAGNESIUM PO, Take by mouth at bedtime., Disp: , Rfl:    meloxicam (MOBIC) 15 MG tablet, Take 15 mg by mouth daily., Disp: , Rfl:    metFORMIN (GLUCOPHAGE-XR) 750 MG 24 hr tablet, TAKE 2 TABLETS EVERY DAY WITH BREAKFAST, Disp: 180 tablet, Rfl: 0   pseudoephedrine (SUDAFED) 120 MG 12 hr tablet, Take 120 mg by mouth 2 (two) times daily., Disp: , Rfl:    Semaglutide,0.25 or 0.5MG /DOS, 2 MG/3ML SOPN, Inject into the skin., Disp: , Rfl:    Vitamin D, Ergocalciferol, (DRISDOL) 1.25 MG (50000 UNIT) CAPS capsule, Take 1 capsule (50,000 Units total) by mouth every 7 (seven) days. Take for 8 total doses(weeks), Disp: 8 capsule, Rfl: 0   vitamin E 1000 UNIT capsule, Take 1,000 Units by mouth daily., Disp: , Rfl:    zinc gluconate 50 MG tablet, Take 50 mg by mouth daily., Disp: , Rfl:    zolpidem (AMBIEN) 5 MG tablet, Take 5 mg by mouth at bedtime. Psych, Disp: , Rfl:    carbamazepine (TEGRETOL) 200  MG tablet, Take 200 mg by mouth 3 (three) times daily. (Patient not taking: Reported on 08/11/2022), Disp: , Rfl:     ALLERGIES   Quetiapine, Alendronate, Atorvastatin, Codeine, Pravastatin, Risedronate, Hydrocodone-acetaminophen, and Amitriptyline     REVIEW OF SYSTEMS    Review of Systems:  Gen:  Denies  fever, sweats, chills weigh loss  HEENT: Denies blurred vision, double vision, ear pain, eye pain, hearing loss, nose bleeds,  sore throat Cardiac:  No dizziness, chest pain or heaviness, chest tightness,edema Resp:   Denies cough or sputum porduction, shortness of breath,wheezing, hemoptysis,  Gi: Denies swallowing difficulty, stomach pain, nausea or vomiting, diarrhea, constipation, bowel incontinence Gu:  Denies bladder incontinence, burning urine Ext:   Denies Joint pain, stiffness or swelling Skin: Denies  skin rash, easy bruising or bleeding or hives Endoc:  Denies polyuria, polydipsia , polyphagia or weight change Psych:   Denies depression, insomnia or hallucinations   Other:  All other systems negative   VS: BP 138/82 (BP Location: Left Arm, Cuff Size: Normal)   Pulse 91   Temp 97.6 F (36.4 C) (Temporal)   Ht 5' 3.5" (1.613 m)   Wt 176 lb 9.6 oz (80.1 kg)   SpO2 95%   BMI 30.79 kg/m      PHYSICAL EXAM  General Appearance: No distress  EYES PERRLA, EOM intact.   NECK Supple, No JVD Pulmonary: normal breath sounds, No wheezing.  CardiovascularNormal S1,S2.  No m/r/g.   Abdomen: Benign, Soft, non-tender. Skin:   warm, no rashes, no ecchymosis  Extremities: normal, no cyanosis, clubbing. Neuro:without focal findings,  speech normal  PSYCHIATRIC: Mood, affect within normal limits.   ALL OTHER ROS ARE NEGATIVE        ASSESSMENT/PLAN    72 year old pleasant white female with abnormal CT chest with a right lower lobe nodular opacity measuring 1.5 cm there could be signs of some air bronchograms signs going through the opacity  At this time I would  recommend follow-up CT chest in 1 month to assess interval changes  Avoid secondhand smoke Avoid SICK contacts Recommend  Masking  when appropriate Recommend Keep up-to-date with vaccinations     MEDICATION ADJUSTMENTS/LABS AND TESTS ORDERED: CT chest 1 month   CURRENT MEDICATIONS REVIEWED AT LENGTH WITH PATIENT TODAY   Patient  satisfied with Plan of action and management. All questions answered  Follow up 4 weeks  Total Time Spent  45 mins   Corrin Parker, M.D.  [facility]  Medical Director [place] Director [facility] Cardio-Pulmonary Department

## 2022-08-11 NOTE — Patient Instructions (Signed)
Follow up CT chest in 1 month

## 2022-08-12 ENCOUNTER — Encounter: Payer: Self-pay | Admitting: Student in an Organized Health Care Education/Training Program

## 2022-08-12 ENCOUNTER — Ambulatory Visit
Admission: RE | Admit: 2022-08-12 | Discharge: 2022-08-12 | Disposition: A | Discharge status: Home / Self Care | Payer: Medicare HMO | Source: Ambulatory Visit | Attending: Student in an Organized Health Care Education/Training Program | Admitting: Student in an Organized Health Care Education/Training Program

## 2022-08-12 ENCOUNTER — Ambulatory Visit
Payer: Medicare HMO | Attending: Student in an Organized Health Care Education/Training Program | Admitting: Student in an Organized Health Care Education/Training Program

## 2022-08-12 VITALS — BP 168/101 | HR 91 | Temp 97.2°F | Resp 16 | Ht 63.0 in | Wt 172.0 lb

## 2022-08-12 DIAGNOSIS — M25562 Pain in left knee: Secondary | ICD-10-CM

## 2022-08-12 DIAGNOSIS — M25512 Pain in left shoulder: Secondary | ICD-10-CM | POA: Diagnosis present

## 2022-08-12 DIAGNOSIS — M1712 Unilateral primary osteoarthritis, left knee: Secondary | ICD-10-CM | POA: Diagnosis present

## 2022-08-12 DIAGNOSIS — M25561 Pain in right knee: Secondary | ICD-10-CM | POA: Diagnosis not present

## 2022-08-12 DIAGNOSIS — G8929 Other chronic pain: Secondary | ICD-10-CM | POA: Diagnosis present

## 2022-08-12 DIAGNOSIS — M1711 Unilateral primary osteoarthritis, right knee: Secondary | ICD-10-CM | POA: Insufficient documentation

## 2022-08-12 DIAGNOSIS — M12812 Other specific arthropathies, not elsewhere classified, left shoulder: Secondary | ICD-10-CM

## 2022-08-12 DIAGNOSIS — M75102 Unspecified rotator cuff tear or rupture of left shoulder, not specified as traumatic: Secondary | ICD-10-CM | POA: Diagnosis present

## 2022-08-12 MED ORDER — DIAZEPAM 5 MG PO TABS
5.0000 mg | ORAL_TABLET | ORAL | Status: AC
  Administered 2022-08-12: 5 mg via ORAL

## 2022-08-12 MED ORDER — ROPIVACAINE HCL 2 MG/ML IJ SOLN
9.0000 mL | Freq: Once | INTRAMUSCULAR | Status: AC
  Administered 2022-08-12: 9 mL via PERINEURAL
  Filled 2022-08-12: qty 20

## 2022-08-12 MED ORDER — DIAZEPAM 5 MG PO TABS
ORAL_TABLET | ORAL | Status: AC
  Filled 2022-08-12: qty 1

## 2022-08-12 MED ORDER — LIDOCAINE HCL 2 % IJ SOLN
20.0000 mL | Freq: Once | INTRAMUSCULAR | Status: AC
  Administered 2022-08-12: 400 mg
  Filled 2022-08-12: qty 20

## 2022-08-12 MED ORDER — METHYLPREDNISOLONE ACETATE 40 MG/ML IJ SUSP
40.0000 mg | Freq: Once | INTRAMUSCULAR | Status: AC
  Administered 2022-08-12: 40 mg via INTRA_ARTICULAR
  Filled 2022-08-12: qty 1

## 2022-08-12 MED ORDER — DEXAMETHASONE SODIUM PHOSPHATE 10 MG/ML IJ SOLN
10.0000 mg | Freq: Once | INTRAMUSCULAR | Status: AC
  Administered 2022-08-12: 10 mg
  Filled 2022-08-12: qty 1

## 2022-08-12 MED ORDER — IOHEXOL 180 MG/ML  SOLN
10.0000 mL | Freq: Once | INTRAMUSCULAR | Status: AC
  Administered 2022-08-12: 10 mL via INTRA_ARTICULAR
  Filled 2022-08-12: qty 20

## 2022-08-12 NOTE — Progress Notes (Signed)
PROVIDER NOTE: Interpretation of information contained herein should be left to medically-trained personnel. Specific patient instructions are provided elsewhere under "Patient Instructions" section of medical record. This document was created in part using STT-dictation technology, any transcriptional errors that may result from this process are unintentional.  Patient: Sharon Kaufman Type: Established DOB: 1951/03/17 MRN: [id number] PCP: Sharon Patch, MD  Service: Procedure DOS: 08/12/2022 Setting: Ambulatory Location: Ambulatory outpatient facility Delivery: Face-to-face Provider: Gillis Santa, MD Specialty: Interventional Pain Management Specialty designation: 09 Location: Outpatient facility Ref. Prov.: Gillis Santa, MD       Interventional Therapy   Procedure: Suprascapular nerve block (SSNB) #1  Laterality:  Left  Level: Superior to scapular spine, lateral to supraspinatus fossa (Suprascapular notch).  Imaging: Fluoroscopic guidance         Anesthesia: Local anesthesia (1-2% Lidocaine) Anxiolysis: 5 mg p.o. Valium DOS: 08/12/2022  Performed by: Gillis Santa, MD  Purpose: Diagnostic/Therapeutic Indications: Shoulder pain, severe enough to impact quality of life and/or function. 1. Primary osteoarthritis of right knee   2. Left rotator cuff tear arthropathy (hx of left shoulder surgery)   3. Chronic left shoulder pain   4. Arthritis of left knee    NAS-11 score:   Pre-procedure: 8 /10   Post-procedure: 0-No pain/10     Target: Suprascapular nerve Location: midway between the medial border of the scapula and the acromion as it runs through the suprascapular notch. Region: Suprascapular, posterior shoulder  Approach: Percutaneous  Neuroanatomy: The suprascapular nerve is the lateral branch of the superior trunk of the brachial plexus. It receives nerve fibers that originate in the nerve roots C5 and C6 (and sometimes C4). It is a mixed nerve, meaning that it provides  both sensory and motor supply for the suprascapular region. Function: The main function of this nerve is to provide motor innervation for two muscles, the supraspinatus and infraspinatus muscles. They are part of the rotator cuff muscles. In addition, the suprascapular nerve provides a sensory supply to the joints of the scapula (glenohumeral and acromioclavicular joints). Rationale (medical necessity): procedure needed and proper for the diagnosis and/or treatment of the patient's medical symptoms and needs.  Position / Prep / Materials:  Position: Prone Materials:  Tray: Block Needle(s):  Type: Spinal  Gauge (G): 22  Length: 3.5 in.  Qty: 1 Prep solution: DuraPrep (Iodine Povacrylex [0.7% available iodine] and Isopropyl Alcohol, 74% w/w) Prep Area: Entire posterior shoulder area. From upper spine to shoulder proper (upper arm), and from lateral neck to lower tip of shoulder blade.   Pre-op H&P Assessment:  Sharon Kaufman is a 72 y.o. (year old), female patient, seen today for interventional treatment. She  has a past surgical history that includes Splenectomy (1973); Appendectomy (2009); Breast lumpectomy (Left, 2008); Ankle Fusion (Left, 2009); Rotator cuff repair (Left, 2011); Total shoulder replacement (Right, 2019); and Reduction mammaplasty (Right, 2014). Sharon Kaufman has a current medication list which includes the following prescription(s): accu-chek fastclix lancets, accu-chek guide, acetaminophen, vitamin c, aspirin ec, bioflavonoid products, biotin, calcium-vitamin d, carbamazepine, carboxymethylcellul-glycerin, cetirizine, co-enzyme q-10, diclofenac, diclofenac sodium cr, famotidine, ibuprofen, lisinopril, lysine, magnesium, meloxicam, metformin, pseudoephedrine, semaglutide(0.25 or 0.5mg /dos), vitamin d (ergocalciferol), vitamin e, zinc gluconate, and zolpidem. Her primarily concern today is the Shoulder Pain (left) and Knee Pain (bilateral)  Initial Vital Signs:  Pulse/HCG Rate: 85ECG  Heart Rate: 89 Temp: (!) 97.2 F (36.2 C) Resp: 16 BP: (!) 160/85 SpO2: 98 %  BMI: Estimated body mass index is 30.47 kg/m as calculated from the  following:   Height as of this encounter: 5\' 3"  (1.6 m).   Weight as of this encounter: 172 lb (78 kg).  Risk Assessment: Allergies: Reviewed. She is allergic to quetiapine, alendronate, atorvastatin, codeine, pravastatin, risedronate, hydrocodone-acetaminophen, and amitriptyline.  Allergy Precautions: None required Coagulopathies: Reviewed. None identified.  Blood-thinner therapy: None at this time Active Infection(s): Reviewed. None identified. Sharon Kaufman is afebrile  Site Confirmation: Sharon Kaufman was asked to confirm the procedure and laterality before marking the site Procedure checklist: Completed Consent: Before the procedure and under the influence of no sedative(s), amnesic(s), or anxiolytics, the patient was informed of the treatment options, risks and possible complications. To fulfill our ethical and legal obligations, as recommended by the American Medical Association's Code of Ethics, I have informed the patient of my clinical impression; the nature and purpose of the treatment or procedure; the risks, benefits, and possible complications of the intervention; the alternatives, including doing nothing; the risk(s) and benefit(s) of the alternative treatment(s) or procedure(s); and the risk(s) and benefit(s) of doing nothing. The patient was provided information about the general risks and possible complications associated with the procedure. These may include, but are not limited to: failure to achieve desired goals, infection, bleeding, organ or nerve damage, allergic reactions, paralysis, and death. In addition, the patient was informed of those risks and complications associated to the procedure, such as failure to decrease pain; infection; bleeding; organ or nerve damage with subsequent damage to sensory, motor, and/or autonomic  systems, resulting in permanent pain, numbness, and/or weakness of one or several areas of the body; allergic reactions; (i.e.: anaphylactic reaction); and/or death. Furthermore, the patient was informed of those risks and complications associated with the medications. These include, but are not limited to: allergic reactions (i.e.: anaphylactic or anaphylactoid reaction(s)); adrenal axis suppression; blood sugar elevation that in diabetics may result in ketoacidosis or comma; water retention that in patients with history of congestive heart failure may result in shortness of breath, pulmonary edema, and decompensation with resultant heart failure; weight gain; swelling or edema; medication-induced neural toxicity; particulate matter embolism and blood vessel occlusion with resultant organ, and/or nervous system infarction; and/or aseptic necrosis of one or more joints. Finally, the patient was informed that Medicine is not an exact science; therefore, there is also the possibility of unforeseen or unpredictable risks and/or possible complications that may result in a catastrophic outcome. The patient indicated having understood very clearly. We have given the patient no guarantees and we have made no promises. Enough time was given to the patient to ask questions, all of which were answered to the patient's satisfaction. Ms. Todisco has indicated that she wanted to continue with the procedure. Attestation: I, the ordering provider, attest that I have discussed with the patient the benefits, risks, side-effects, alternatives, likelihood of achieving goals, and potential problems during recovery for the procedure that I have provided informed consent. Date  Time: 08/12/2022 10:53 AM  Pre-Procedure Preparation:  Monitoring: As per clinic protocol. Respiration, ETCO2, SpO2, BP, heart rate and rhythm monitor placed and checked for adequate function Safety Precautions: Patient was assessed for positional comfort  and pressure points before starting the procedure. Time-out: I initiated and conducted the "Time-out" before starting the procedure, as per protocol. The patient was asked to participate by confirming the accuracy of the "Time Out" information. Verification of the correct person, site, and procedure were performed and confirmed by me, the nursing staff, and the patient. "Time-out" conducted as per Joint Commission's Universal Protocol (UP.01.01.01).  Time: 1209  Description of Procedure:          Procedural Technique Safety Precautions: Aspiration looking for blood return was conducted prior to all injections. At no point did we inject any substances, as a needle was being advanced. No attempts were made at seeking any paresthesias. Safe injection practices and needle disposal techniques used. Medications properly checked for expiration dates. SDV (single dose vial) medications used. Description of the Procedure: Protocol guidelines were followed. The patient was placed in position over the procedure table. The target area was identified and the area prepped in the usual manner. Skin & deeper tissues infiltrated with local anesthetic. Appropriate amount of time allowed to pass for local anesthetics to take effect. The procedure needles were then advanced to the target area. Proper needle placement secured. Negative aspiration confirmed. Solution injected in intermittent fashion, asking for systemic symptoms every 0.5cc of injectate. The needles were then removed and the area cleansed, making sure to leave some of the prepping solution back to take advantage of its long term bactericidal properties.  Vitals:   08/12/22 1159 08/12/22 1203 08/12/22 1210 08/12/22 1220  BP: (!) 173/87 (!) 188/89 (!) 200/101 (!) 168/101  Pulse:   91   Resp: 15 16 16    Temp:      TempSrc:      SpO2: 95% 98% 94%   Weight:      Height:        5 cc solution made of 4 cc of 0.2% ropivacaine, 1 cc of methylprednisolone, 40  mg/cc.  Injected along the left suprascapular nerve after contrast confirmation  Start Time: 1209 hrs. End Time: 1211 hrs.  Imaging Guidance (Spinal):          Type of Imaging Technique: Fluoroscopy Guidance (Spinal) Indication(s): Assistance in needle guidance and placement for procedures requiring needle placement in or near specific anatomical locations not easily accessible without such assistance. Exposure Time: Please see nurses notes. Contrast: Before injecting any contrast, we confirmed that the patient did not have an allergy to iodine, shellfish, or radiological contrast. Once satisfactory needle placement was completed at the desired level, radiological contrast was injected. Contrast injected under live fluoroscopy. No contrast complications. See chart for type and volume of contrast used. Fluoroscopic Guidance: I was personally present during the use of fluoroscopy. "Tunnel Vision Technique" used to obtain the best possible view of the target area. Parallax error corrected before commencing the procedure. "Direction-depth-direction" technique used to introduce the needle under continuous pulsed fluoroscopy. Once target was reached, antero-posterior, oblique, and lateral fluoroscopic projection used confirm needle placement in all planes. Images permanently stored in EMR. Interpretation: I personally interpreted the imaging intraoperatively. Adequate needle placement confirmed in multiple planes. Appropriate spread of contrast into desired area was observed. No evidence of afferent or efferent intravascular uptake. No intrathecal or subarachnoid spread observed. Permanent images saved into the patient's record.  Antibiotic Prophylaxis:   Anti-infectives (From admission, onward)    None      Indication(s): None identified  Post-operative Assessment:  Post-procedure Vital Signs:  Pulse/HCG Rate: 9187 Temp: (!) 97.2 F (36.2 C) Resp: 16 BP: (!) 168/101 SpO2: 94 %  EBL:  None  Complications: No immediate post-treatment complications observed by team, or reported by patient.  Note: The patient tolerated the entire procedure well. A repeat set of vitals were taken after the procedure and the patient was kept under observation following institutional policy, for this type of procedure. Post-procedural neurological assessment was performed, showing return  to baseline, prior to discharge. The patient was provided with post-procedure discharge instructions, including a section on how to identify potential problems. Should any problems arise concerning this procedure, the patient was given instructions to immediately contact us, at any time, without hesitation. In any case, we plan to contact the patient by telephone for a follow-up status report regarding this interventional procedure.  Comments:  No additional relevant information.  Plan of Care (POC)  Orders:  Orders Placed This Encounter  Procedures   DG PAIN CLIN[facility] C-ARM 1-60 MIN NO REPORT    Intraoperative interpretation by procedural physician at [place].    Standing Status:   Standing    Number of Occurrences:   1    Order Specific Question:   Reason for exam:    Answer:   Assistance in needle guidance and placement for procedures requiring needle placement in or near specific anatomical locations not easily accessible without such assistance.    Medications ordered for procedure: Meds ordered this encounter  Medications   iohexol (OMNIPAQUE) 180 MG/ML injection 10 mL    Must be Myelogram-compatible. If not available, you may substitute with a water-soluble, non-ionic, hypoallergenic, myelogram-compatible radiological contrast medium.   lidocaine (XYLOCAINE) 2 % (with pres) injection 400 mg   dexamethasone (DECADRON) injection 10 mg   dexamethasone (DECADRON) injection 10 mg   ropivacaine (PF) 2 mg/mL (0.2%) (NAROPIN) injection 9 mL   ropivacaine (PF) 2 mg/mL (0.2%) (NAROPIN) injection 9  mL   methylPREDNISolone acetate (DEPO-MEDROL) injection 40 mg   diazepam (VALIUM) tablet 5 mg    Make sure Flumazenil is available in the pyxis when using this medication. If oversedation occurs, administer 0.2 mg IV over 15 sec. If after 45 sec no response, administer 0.2 mg again over 1 min; may repeat at 1 min intervals; not to exceed 4 doses (1 mg)   Medications administered: We administered iohexol, lidocaine, dexamethasone, dexamethasone, ropivacaine (PF) 2 mg/mL (0.2%), ropivacaine (PF) 2 mg/mL (0.2%), methylPREDNISolone acetate, and diazepam.  See the medical record for exact dosing, route, and time of administration.  Follow-up plan:   Return in about 4 weeks (around 09/09/2022) for Post Procedure Evaluation, in person.       Bilateral genicular nerve block, left suprascapular nerve block    Recent Visits Date Type Provider Dept  07/14/22 Office Visit Gillis Santa, MD [facility]  07/02/22 Office Visit Gillis Santa, MD [facility]  Showing recent visits within past 90 days and meeting all other requirements Today's Visits Date Type Provider Dept  08/12/22 Procedure visit Gillis Santa, MD [facility]  Showing today's visits and meeting all other requirements Future Appointments Date Type Provider Dept  09/08/22 Appointment Gillis Santa, MD [facility]  Showing future appointments within next 90 days and meeting all other requirements  Disposition: Discharge home  Discharge (Date  Time): 08/12/2022; 1224 hrs.   Primary Care Physician: Sharon Patch, MD Location: [facility] Note by: Gillis Santa, MD (TTS technology used. I apologize for any typographical errors that were not detected and corrected.) Date: 08/12/2022; Time: 12:58 PM  Disclaimer:  Medicine is not an Chief Strategy Officer. The only guarantee in medicine is that nothing is guaranteed. It is important to note that the decision to proceed with  this intervention was based on the information collected from the patient. The Data and conclusions were drawn from the patient's questionnaire, the interview, and the physical examination. Because the information was provided in  large part by the patient, it cannot be guaranteed that it has not been purposely or unconsciously manipulated. Every effort has been made to obtain as much relevant data as possible for this evaluation. It is important to note that the conclusions that lead to this procedure are derived in large part from the available data. Always take into account that the treatment will also be dependent on availability of resources and existing treatment guidelines, considered by other Pain Management Practitioners as being common knowledge and practice, at the time of the intervention. For Medico-Legal purposes, it is also important to point out that variation in procedural techniques and pharmacological choices are the acceptable norm. The indications, contraindications, technique, and results of the above procedure should only be interpreted and judged by a Board-Certified Interventional Pain Specialist with extensive familiarity and expertise in the same exact procedure and technique.

## 2022-08-12 NOTE — Progress Notes (Signed)
PROVIDER NOTE: Interpretation of information contained herein should be left to medically-trained personnel. Specific patient instructions are provided elsewhere under "Patient Instructions" section of medical record. This document was created in part using STT-dictation technology, any transcriptional errors that may result from this process are unintentional.  Patient: Sharon Kaufman Type: Established DOB: December 08, 1950 MRN: [id number] PCP: Juline Patch, MD  Service: Procedure DOS: 08/12/2022 Setting: Ambulatory Location: Ambulatory outpatient facility Delivery: Face-to-face Provider: Gillis Santa, MD Specialty: Interventional Pain Management Specialty designation: 09 Location: Outpatient facility Ref. Prov.: Gillis Santa, MD       Interventional Therapy   Primary Reason for Visit: Interventional Pain Management Treatment. CC: Shoulder Pain (left) and Knee Pain (bilateral)   Procedure:               Type: Genicular Nerves Block (Superolateral, Superomedial, and Inferomedial Genicular Nerves)  #1  Laterality: Bilateral (-50)  Level: Superior and inferior to the knee joint.  Imaging: Fluoroscopic guidance Anesthesia: Local anesthesia (1-2% Lidocaine) Anxiolysis: PO Valium 5 mg DOS: 08/12/2022  Performed by: Gillis Santa, MD  Purpose: Diagnostic/Therapeutic Indications: Chronic knee pain severe enough to impact quality of life or function. Rationale (medical necessity): procedure needed and proper for the diagnosis and/or treatment of [name]'s medical symptoms and needs. 1. Primary osteoarthritis of right knee   2. Left rotator cuff tear arthropathy (hx of left shoulder surgery)   3. Chronic left shoulder pain   4. Arthritis of left knee    NAS-11 Pain score:   Pre-procedure: 8 /10   Post-procedure: 0-No pain/10     Target: For Genicular Nerve block(s), the targets are: the superolateral genicular nerve, located in the lateral distal portion of the femoral shaft as it  curves to form the lateral epicondyle, in the region of the distal femoral metaphysis; the superomedial genicular nerve, located in the medial distal portion of the femoral shaft as it curves to form the medial epicondyle; and the inferomedial genicular nerve, located in the medial, proximal portion of the tibial shaft, as it curves to form the medial epicondyle, in the region of the proximal tibial metaphysis.  Location: Superolateral, Superomedial, and Inferomedial aspects of knee joint.  Region: Lateral, Anterior, and Medial aspects of the knee joint, above and below the knee joint proper. Approach: Percutaneous  Type of procedure: Percutaneous perineural nerve block. The genicular nerve block is a motor-sparing technique that anesthetizes the sensory terminal branches innervating the knee joint, resulting in anesthesia of the anterior compartment of the knee. The distribution of anesthesia of each nerve is mostly in the corresponding quadrant.  Neuroanatomy: The superolateral genicular nerve (SLGN) courses around the femur shaft to pass between the vastus lateralis and the lateral epicondyle. It accompanies the superior lateral genicular artery. The superomedial genicular nerve (SMGN) courses around the femur shaft, following the superior medial genicular artery, to pass between the adductor magnus tendon and the medial epicondyle below the vastus medialis. The inferolateral genicular nerve (ILGN) courses around the tibial lateral epicondyle deep to the lateral collateral ligament, following the inferior lateral genicular artery, superior of the fibula head. The inferomedial genicular nerve (IMGN) courses horizontally below the medial collateral ligament between the tibial medial epicondyle and the insertion of the collateral ligament. It accompanies the inferior medial genicular artery. The recurrent peroneal nerve originates in the inferior popliteal region from the common peroneal nerve and courses  horizontally around the fibula to pass just inferior of the fibula head and travel superior to the anterolateral tibial epicondyle.  It accompanies the recurrent tibial artery.  Position / Prep / Materials:  Position: Supine, Modified Fowler's position with pillows under the targeted knee(s). The patient is placed in a supine position with the knee slightly flexed by placing a pillow in the popliteal fossa. Prep solution: DuraPrep (Iodine Povacrylex [0.7% available iodine] and Isopropyl Alcohol, 74% w/w) Prep Area: Entire knee area, from mid-thigh to mid-shin, lateral, anterior, and medial aspects. Materials:  Tray: Block Needle(s):  Type: Spinal  Gauge (G): 22  Length: 3.5-in  Qty: 3  Pre-op H&P Assessment:  [name] is a 72 y.o. (year old), female patient, seen today for interventional treatment. She  has a past surgical history that includes Splenectomy (1973); Appendectomy (2009); Breast lumpectomy (Left, 2008); Ankle Fusion (Left, 2009); Rotator cuff repair (Left, 2011); Total shoulder replacement (Right, 2019); and Reduction mammaplasty (Right, 2014). [name] has a current medication list which includes the following prescription(s): accu-chek fastclix lancets, accu-chek guide, acetaminophen, vitamin c, aspirin ec, bioflavonoid products, biotin, calcium-vitamin d, carbamazepine, carboxymethylcellul-glycerin, cetirizine, co-enzyme q-10, diclofenac, diclofenac sodium cr, famotidine, ibuprofen, lisinopril, lysine, magnesium, meloxicam, metformin, pseudoephedrine, semaglutide(0.25 or 0.5mg /dos), vitamin d (ergocalciferol), vitamin e, zinc gluconate, and zolpidem. Her primarily concern today is the Shoulder Pain (left) and Knee Pain (bilateral)  Initial Vital Signs:  Pulse/HCG Rate: 85ECG Heart Rate: 89 Temp: (!) 97.2 F (36.2 C) Resp: 16 BP: (!) 160/85 SpO2: 98 %  BMI: Estimated body mass index is 30.47 kg/m as calculated from the following:   Height as of this encounter: 5\' 3"   (1.6 m).   Weight as of this encounter: 172 lb (78 kg).  Risk Assessment: Allergies: Reviewed. She is allergic to quetiapine, alendronate, atorvastatin, codeine, pravastatin, risedronate, hydrocodone-acetaminophen, and amitriptyline.  Allergy Precautions: None required Coagulopathies: Reviewed. None identified.  Blood-thinner therapy: None at this time Active Infection(s): Reviewed. None identified. Ms. Kilgour is afebrile  Site Confirmation: Ms. Louk was asked to confirm the procedure and laterality before marking the site Procedure checklist: Completed Consent: Before the procedure and under the influence of no sedative(s), amnesic(s), or anxiolytics, the patient was informed of the treatment options, risks and possible complications. To fulfill our ethical and legal obligations, as recommended by the American Medical Association's Code of Ethics, I have informed the patient of my clinical impression; the nature and purpose of the treatment or procedure; the risks, benefits, and possible complications of the intervention; the alternatives, including doing nothing; the risk(s) and benefit(s) of the alternative treatment(s) or procedure(s); and the risk(s) and benefit(s) of doing nothing. The patient was provided information about the general risks and possible complications associated with the procedure. These may include, but are not limited to: failure to achieve desired goals, infection, bleeding, organ or nerve damage, allergic reactions, paralysis, and death. In addition, the patient was informed of those risks and complications associated to the procedure, such as failure to decrease pain; infection; bleeding; organ or nerve damage with subsequent damage to sensory, motor, and/or autonomic systems, resulting in permanent pain, numbness, and/or weakness of one or several areas of the body; allergic reactions; (i.e.: anaphylactic reaction); and/or death. Furthermore, the patient was informed  of those risks and complications associated with the medications. These include, but are not limited to: allergic reactions (i.e.: anaphylactic or anaphylactoid reaction(s)); adrenal axis suppression; blood sugar elevation that in diabetics may result in ketoacidosis or comma; water retention that in patients with history of congestive heart failure may result in shortness of breath, pulmonary edema, and decompensation with resultant heart failure;  weight gain; swelling or edema; medication-induced neural toxicity; particulate matter embolism and blood vessel occlusion with resultant organ, and/or nervous system infarction; and/or aseptic necrosis of one or more joints. Finally, the patient was informed that Medicine is not an exact science; therefore, there is also the possibility of unforeseen or unpredictable risks and/or possible complications that may result in a catastrophic outcome. The patient indicated having understood very clearly. We have given the patient no guarantees and we have made no promises. Enough time was given to the patient to ask questions, all of which were answered to the patient's satisfaction. Ms. Pacific has indicated that she wanted to continue with the procedure. Attestation: I, the ordering provider, attest that I have discussed with the patient the benefits, risks, side-effects, alternatives, likelihood of achieving goals, and potential problems during recovery for the procedure that I have provided informed consent. Date  Time: 08/12/2022 10:53 AM  Pre-Procedure Preparation:  Monitoring: As per clinic protocol. Respiration, ETCO2, SpO2, BP, heart rate and rhythm monitor placed and checked for adequate function Safety Precautions: Patient was assessed for positional comfort and pressure points before starting the procedure. Time-out: I initiated and conducted the "Time-out" before starting the procedure, as per protocol. The patient was asked to participate by confirming the  accuracy of the "Time Out" information. Verification of the correct person, site, and procedure were performed and confirmed by me, the nursing staff, and the patient. "Time-out" conducted as per Joint Commission's Universal Protocol (UP.01.01.01). Time: 1209  Description/Narrative of Procedure:          Rationale (medical necessity): procedure needed and proper for the diagnosis and/or treatment of the patient's medical symptoms and needs. Procedural Technique Safety Precautions: Aspiration looking for blood return was conducted prior to all injections. At no point did we inject any substances, as a needle was being advanced. No attempts were made at seeking any paresthesias. Safe injection practices and needle disposal techniques used. Medications properly checked for expiration dates. SDV (single dose vial) medications used. Description of the Procedure: Protocol guidelines were followed. The patient was assisted into a comfortable position. The target area was identified and the area prepped in the usual manner. Skin & deeper tissues infiltrated with local anesthetic. Appropriate amount of time allowed to pass for local anesthetics to take effect. The procedure needles were then advanced to the target area. Proper needle placement secured. Negative aspiration confirmed. Solution injected in intermittent fashion, asking for systemic symptoms every 0.5cc of injectate. The needles were then removed and the area cleansed, making sure to leave some of the prepping solution back to take advantage of its long term bactericidal properties.  9 cc solution made of 8 cc of 0.2% ropivacaine, 1 cc of Decadron 10 mg/cc.  3 cc injected at each level below for the left knee 9 cc solution made of 8 cc of 0.2% ropivacaine, 1 cc of Decadron 10 mg/cc.  3 cc injected at each level below for the right knee             Vitals:   08/12/22 1159 08/12/22 1203 08/12/22 1210 08/12/22 1220  BP: (!) 173/87 (!) 188/89 (!)  200/101 (!) 168/101  Pulse:   91   Resp: 15 16 16    Temp:      TempSrc:      SpO2: 95% 98% 94%   Weight:      Height:         Start Time: 1209 hrs. End Time: 1211 hrs.  Imaging  Guidance (Non-Spinal):          Type of Imaging Technique: Fluoroscopy Guidance (Non-Spinal) Indication(s): Assistance in needle guidance and placement for procedures requiring needle placement in or near specific anatomical locations not easily accessible without such assistance. Exposure Time: Please see nurses notes. Contrast: None used. Fluoroscopic Guidance: I was personally present during the use of fluoroscopy. "Tunnel Vision Technique" used to obtain the best possible view of the target area. Parallax error corrected before commencing the procedure. "Direction-depth-direction" technique used to introduce the needle under continuous pulsed fluoroscopy. Once target was reached, antero-posterior, oblique, and lateral fluoroscopic projection used confirm needle placement in all planes. Images permanently stored in EMR. Interpretation: No contrast injected. I personally interpreted the imaging intraoperatively. Adequate needle placement confirmed in multiple planes. Permanent images saved into the patient's record.  Post-operative Assessment:  Post-procedure Vital Signs:  Pulse/HCG Rate: 9187 Temp: (!) 97.2 F (36.2 C) Resp: 16 BP: (!) 168/101 SpO2: 94 %  EBL: None  Complications: No immediate post-treatment complications observed by team, or reported by patient.  Note: The patient tolerated the entire procedure well. A repeat set of vitals were taken after the procedure and the patient was kept under observation following institutional policy, for this type of procedure. Post-procedural neurological assessment was performed, showing return to baseline, prior to discharge. The patient was provided with post-procedure discharge instructions, including a section on how to identify potential problems. Should  any problems arise concerning this procedure, the patient was given instructions to immediately contact us, at any time, without hesitation. In any case, we plan to contact the patient by telephone for a follow-up status report regarding this interventional procedure.  Comments:  No additional relevant information.  Plan of Care (POC)  Orders:  Orders Placed This Encounter  Procedures   DG PAIN CLINIC C-ARM 1-60 MIN NO REPORT    Intraoperative interpretation by procedural physician at [place].    Standing Status:   Standing    Number of Occurrences:   1    Order Specific Question:   Reason for exam:    Answer:   Assistance in needle guidance and placement for procedures requiring needle placement in or near specific anatomical locations not easily accessible without such assistance.     Medications ordered for procedure: Meds ordered this encounter  Medications   iohexol (OMNIPAQUE) 180 MG/ML injection 10 mL    Must be Myelogram-compatible. If not available, you may substitute with a water-soluble, non-ionic, hypoallergenic, myelogram-compatible radiological contrast medium.   lidocaine (XYLOCAINE) 2 % (with pres) injection 400 mg   dexamethasone (DECADRON) injection 10 mg   dexamethasone (DECADRON) injection 10 mg   ropivacaine (PF) 2 mg/mL (0.2%) (NAROPIN) injection 9 mL   ropivacaine (PF) 2 mg/mL (0.2%) (NAROPIN) injection 9 mL   methylPREDNISolone acetate (DEPO-MEDROL) injection 40 mg   diazepam (VALIUM) tablet 5 mg    Make sure Flumazenil is available in the pyxis when using this medication. If oversedation occurs, administer 0.2 mg IV over 15 sec. If after 45 sec no response, administer 0.2 mg again over 1 min; may repeat at 1 min intervals; not to exceed 4 doses (1 mg)   Medications administered: We administered iohexol, lidocaine, dexamethasone, dexamethasone, ropivacaine (PF) 2 mg/mL (0.2%), ropivacaine (PF) 2 mg/mL (0.2%), methylPREDNISolone acetate, and  diazepam.  See the medical record for exact dosing, route, and time of administration.  Follow-up plan:   Return in about 4 weeks (around 09/09/2022) for Post Procedure Evaluation, in person.  Bilateral genicular nerve block, left suprascapular nerve block    Recent Visits Date Type Provider Dept  07/14/22 Office Visit Gillis Santa, MD [facility]  07/02/22 Office Visit Gillis Santa, MD [facility]  Showing recent visits within past 90 days and meeting all other requirements Today's Visits Date Type Provider Dept  08/12/22 Procedure visit Gillis Santa, MD [facility]  Showing today's visits and meeting all other requirements Future Appointments Date Type Provider Dept  09/08/22 Appointment Gillis Santa, MD [facility]  Showing future appointments within next 90 days and meeting all other requirements  Disposition: Discharge home  Discharge (Date  Time): 08/12/2022; 1224 hrs.   Primary Care Physician: Juline Patch, MD Location: [facility] Note by: Gillis Santa, MD (TTS technology used. I apologize for any typographical errors that were not detected and corrected.) Date: 08/12/2022; Time: 12:56 PM  Disclaimer:  Medicine is not an Chief Strategy Officer. The only guarantee in medicine is that nothing is guaranteed. It is important to note that the decision to proceed with this intervention was based on the information collected from the patient. The Data and conclusions were drawn from the patient's questionnaire, the interview, and the physical examination. Because the information was provided in large part by the patient, it cannot be guaranteed that it has not been purposely or unconsciously manipulated. Every effort has been made to obtain as much relevant data as possible for this evaluation. It is important to note that the conclusions that lead to this procedure are derived in large part from the available  data. Always take into account that the treatment will also be dependent on availability of resources and existing treatment guidelines, considered by other Pain Management Practitioners as being common knowledge and practice, at the time of the intervention. For Medico-Legal purposes, it is also important to point out that variation in procedural techniques and pharmacological choices are the acceptable norm. The indications, contraindications, technique, and results of the above procedure should only be interpreted and judged by a Board-Certified Interventional Pain Specialist with extensive familiarity and expertise in the same exact procedure and technique.

## 2022-08-12 NOTE — Patient Instructions (Signed)

## 2022-08-13 ENCOUNTER — Telehealth: Payer: Self-pay | Admitting: *Deleted

## 2022-08-13 NOTE — Telephone Encounter (Signed)
No problems post procedure. 

## 2022-08-14 DIAGNOSIS — M8580 Other specified disorders of bone density and structure, unspecified site: Secondary | ICD-10-CM | POA: Diagnosis not present

## 2022-08-14 DIAGNOSIS — E1169 Type 2 diabetes mellitus with other specified complication: Secondary | ICD-10-CM | POA: Diagnosis not present

## 2022-08-14 DIAGNOSIS — E785 Hyperlipidemia, unspecified: Secondary | ICD-10-CM | POA: Diagnosis not present

## 2022-08-14 DIAGNOSIS — I152 Hypertension secondary to endocrine disorders: Secondary | ICD-10-CM | POA: Diagnosis not present

## 2022-08-14 DIAGNOSIS — E1159 Type 2 diabetes mellitus with other circulatory complications: Secondary | ICD-10-CM | POA: Diagnosis not present

## 2022-08-14 DIAGNOSIS — E119 Type 2 diabetes mellitus without complications: Secondary | ICD-10-CM | POA: Diagnosis not present

## 2022-08-25 LAB — HM DIABETES EYE EXAM

## 2022-09-07 ENCOUNTER — Telehealth: Payer: Self-pay | Admitting: Family Medicine

## 2022-09-07 DIAGNOSIS — M797 Fibromyalgia: Secondary | ICD-10-CM | POA: Diagnosis not present

## 2022-09-07 DIAGNOSIS — R252 Cramp and spasm: Secondary | ICD-10-CM | POA: Diagnosis not present

## 2022-09-08 ENCOUNTER — Encounter: Payer: Self-pay | Admitting: Student in an Organized Health Care Education/Training Program

## 2022-09-08 ENCOUNTER — Encounter: Payer: Self-pay | Admitting: Family Medicine

## 2022-09-08 ENCOUNTER — Ambulatory Visit (HOSPITAL_BASED_OUTPATIENT_CLINIC_OR_DEPARTMENT_OTHER): Payer: Medicare HMO | Admitting: Student in an Organized Health Care Education/Training Program

## 2022-09-08 VITALS — BP 126/87 | HR 100 | Temp 97.1°F | Resp 16 | Ht 64.0 in | Wt 173.0 lb

## 2022-09-08 DIAGNOSIS — R911 Solitary pulmonary nodule: Secondary | ICD-10-CM | POA: Diagnosis not present

## 2022-09-08 DIAGNOSIS — M1711 Unilateral primary osteoarthritis, right knee: Secondary | ICD-10-CM

## 2022-09-08 DIAGNOSIS — M23203 Derangement of unspecified medial meniscus due to old tear or injury, right knee: Secondary | ICD-10-CM | POA: Diagnosis not present

## 2022-09-08 DIAGNOSIS — M797 Fibromyalgia: Secondary | ICD-10-CM | POA: Diagnosis not present

## 2022-09-08 DIAGNOSIS — M1712 Unilateral primary osteoarthritis, left knee: Secondary | ICD-10-CM

## 2022-09-08 MED ORDER — SAVELLA 25 MG PO TABS: ORAL_TABLET | ORAL | 0 refills | Status: DC

## 2022-09-08 NOTE — Progress Notes (Signed)
Safety precautions to be maintained throughout the outpatient stay will include: orient to surroundings, keep bed in low position, maintain call bell within reach at all times, provide assistance with transfer out of bed and ambulation.  

## 2022-09-08 NOTE — Progress Notes (Signed)
PROVIDER NOTE: Information contained herein reflects review and annotations entered in association with encounter. Interpretation of such information and data should be left to medically-trained personnel. Information provided to patient can be located elsewhere in the medical record under "Patient Instructions". Document created using STT-dictation technology, any transcriptional errors that may result from process are unintentional.    Patient: Sharon Kaufman  Service Category: E/M  Provider: Edward Jolly, MD  DOB: May 15, 1951  DOS: 09/08/2022  Referring Provider: Duanne Limerick, MD  MRN: [id number]  Specialty: Interventional Pain Management  PCP: Duanne Limerick, MD  Type: Established Patient  Setting: Ambulatory outpatient    Location: Office  Delivery: Face-to-face     HPI  [name], a 72 y.o. year old female, is here today because of her Primary osteoarthritis of right knee [M17.11]. [name]'s primary complain today is Ankle Pain (Left ), Knee Pain (Bilateral ), and fibromalgia  (Generalized )  Pain Assessment: Severity of Chronic pain is reported as a 4 /10. Location: Ankle (knees bilateral) Left/denies. Onset: More than a month ago. Quality: Discomfort, Sharp. Timing: Constant. Modifying factor(s): procedure helped the shoulder and the knees.  diclofenac, rest. Vitals:  height is 5\' 4"  (1.626 m) and weight is 173 lb (78.5 kg). Her temporal temperature is 97.1 F (36.2 C) (abnormal). Her blood pressure is 126/87 and her pulse is 100. Her respiration is 16 and oxygen saturation is 97%.  BMI: Estimated body mass index is 29.7 kg/m as calculated from the following:   Height as of this encounter: 5\' 4"  (1.626 m).   Weight as of this encounter: 173 lb (78.5 kg). Last encounter: 07/14/2022. Last procedure: 08/12/2022.  Reason for encounter: post-procedure evaluation and assessment.    Post-procedure evaluation   Procedure1 : Suprascapular nerve block (SSNB) #1  Laterality:   Left  Level: Superior to scapular spine, lateral to supraspinatus fossa (Suprascapular notch).  Imaging: Fluoroscopic guidance         Anesthesia: Local anesthesia (1-2% Lidocaine) Anxiolysis: 5 mg p.o. Valium DOS: 08/12/2022  Performed by: Edward Jolly, MD  Procedure2  Type: Genicular Nerves Block (Superolateral, Superomedial, and Inferomedial Genicular Nerves)  #1  Laterality: Bilateral (-50)  Level: Superior and inferior to the knee joint.  Imaging: Fluoroscopic guidance Anesthesia: Local anesthesia (1-2% Lidocaine) Anxiolysis: PO Valium 5 mg DOS: 08/12/2022  Performed by: Edward Jolly, MD  Purpose: Diagnostic/Therapeutic Indications: Shoulder pain, severe enough to impact quality of life and/or function. 1. Primary osteoarthritis of right knee   2. Left rotator cuff tear arthropathy (hx of left shoulder surgery)   3. Chronic left shoulder pain   4. Arthritis of left knee    NAS-11 score:   Pre-procedure: 8 /10   Post-procedure: 0-No pain/10      Effectiveness:  Initial hour after procedure: 100 % (100)  Subsequent 4-6 hours post-procedure: 100 % (100)  Analgesia past initial 6 hours: 100 % (pain was gone for approx 1 week and then has gradually come back but is nowhere near what it was.)  Ongoing improvement:  Analgesic: Patient still experiencing significant pain relief after her left suprascapular nerve block, knee pain has returned however did note greater than 50% pain relief for 2 weeks. Function: [name] reports improvement in function ROM: [name] reports improvement in ROM    ROS  Constitutional: Denies any fever or chills Gastrointestinal: No reported hemesis, hematochezia, vomiting, or acute GI distress Musculoskeletal:  Bilateral knee pain Neurological: No reported episodes of acute onset apraxia,  aphasia, dysarthria, agnosia, amnesia, paralysis, loss of coordination, or loss of consciousness  Medication Review  Accu-Chek FastClix Lancets,  Bioflavonoid Products, Biotin, Calcium-Vitamin D, Carboxymethylcellul-Glycerin, Diclofenac Sodium CR, Famotidine, Lysine, Magnesium, Milnacipran HCl, Semaglutide(0.25 or 0.5MG /DOS), Vitamin D (Ergocalciferol), acetaminophen, aspirin EC, carbamazepine, cetirizine, co-enzyme Q-10, cycloSPORINE, glucose blood, ibuprofen, lisinopril, meloxicam, metFORMIN, pseudoephedrine, vitamin C, vitamin E, zinc gluconate, and zolpidem  History Review  Allergy: [name] is allergic to quetiapine, alendronate, atorvastatin, codeine, pravastatin, risedronate, hydrocodone-acetaminophen, and amitriptyline. Drug: [name]  reports no history of drug use. Alcohol:  reports current alcohol use of about 2.0 standard drinks of alcohol per week. Tobacco:  reports that she quit smoking about 16 years ago. Her smoking use included cigarettes. She has never used smokeless tobacco. Social: [name]  reports that she quit smoking about 16 years ago. Her smoking use included cigarettes. She has never used smokeless tobacco. She reports current alcohol use of about 2.0 standard drinks of alcohol per week. She reports that she does not use drugs. Medical:  has a past medical history of Allergy, Anxiety, Breast cancer, Breast cancer, left (2008), Diabetes mellitus without complication, GERD (gastroesophageal reflux disease), Hyperlipidemia, Hypertension, and Personal history of radiation therapy (2008). Surgical: [name]  has a past surgical history that includes Splenectomy (1973); Appendectomy (2009); Breast lumpectomy (Left, 2008); Ankle Fusion (Left, 2009); Rotator cuff repair (Left, 2011); Total shoulder replacement (Right, 2019); and Reduction mammaplasty (Right, 2014). Family: family history includes Alcohol abuse in her father; Liver disease in her father; Rheum arthritis in her mother.  Laboratory Chemistry Profile   Renal Lab Results  Component Value Date   BUN 30 (H) 04/28/2022   CREATININE 0.99 04/28/2022    BCR 30 (H) 04/28/2022    Hepatic Lab Results  Component Value Date   AST 24 01/02/2022   ALT 20 01/02/2022   ALBUMIN 4.4 01/02/2022   ALKPHOS 87 01/02/2022    Electrolytes Lab Results  Component Value Date   NA 131 (L) 04/28/2022   K 5.5 (H) 04/28/2022   CL 98 04/28/2022   CALCIUM 9.3 04/28/2022   PHOS 2.7 (L) 12/02/2021    Bone Lab Results  Component Value Date   VD25OH 48.6 04/28/2022    Inflammation (CRP: Acute Phase) (ESR: Chronic Phase) No results found for: "CRP", "ESRSEDRATE", "LATICACIDVEN"       Note: Above Lab results reviewed.  Recent Imaging Review  DG PAIN CLINIC C-ARM 1-60 MIN NO REPORT Fluoro was used, but no Radiologist interpretation will be provided.  Please refer to "NOTES" tab for provider progress note. Note: Reviewed        Physical Exam  General appearance: Well nourished, well developed, and well hydrated. In no apparent acute distress Mental status: Alert, oriented x 3 (person, place, & time)       Respiratory: No evidence of acute respiratory distress Eyes: PERLA Vitals: BP 126/87 (BP Location: Right Arm, Patient Position: Sitting, Cuff Size: Normal)   Pulse 100   Temp (!) 97.1 F (36.2 C) (Temporal)   Resp 16   Ht  (1.626 m)   Wt 173 lb (78.5 kg)   SpO2 97%   BMI 29.70 kg/m  BMI: Estimated body mass index is 29.7 kg/m as calculated from the following:   Height as of this encounter:  (1.626 m).   Weight as of this encounter: 173 lb (78.5 kg). Ideal: Ideal body weight: 54.7 kg (120 lb 9.5 oz) Adjusted ideal body weight: 64.2 kg (141 lb 8.9 oz)  Bilateral knee pain, worse with weightbearing  Assessment   Diagnosis Status  1. Primary osteoarthritis of right knee   2. Arthritis of left knee   3. Old tear of medial meniscus of right knee, unspecified tear type   4. Fibromyalgia    Persistent Persistent Controlled   Updated Problems: No problems updated.  Plan of Care   [name] has a current medication  list which includes the following long-term medication(s): calcium-vitamin d, famotidine, lisinopril, metformin, carbamazepine, and cetirizine.  Pharmacotherapy (Medications Ordered):  Discussed treatment options for fibromyalgia management.  She has tried gabapentin, Lyrica, Cymbalta in the past with limited response.  Discussed low-dose naltrexone versus Savella as below  Meds ordered this encounter  Medications   DISCONTD: Milnacipran HCl (SAVELLA) 25 MG TABS    Sig: Take 0.5 tablets (12.5 mg total) by mouth 2 (two) times daily for 3 days, THEN 1 tablet (25 mg total) 2 (two) times daily for 3 days, THEN 2 tablets (50 mg total) 2 (two) times daily.    Dispense:  145 tablet    Refill:  0   Milnacipran HCl (SAVELLA) 25 MG TABS    Sig: Take 0.5 tablets (12.5 mg total) by mouth 2 (two) times daily for 3 days, THEN 1 tablet (25 mg total) 2 (two) times daily for 3 days, THEN 2 tablets (50 mg total) 2 (two) times daily.    Dispense:  145 tablet    Refill:  0   Orders:  Status post positive bilateral genicular nerve block as detailed above.  Discussed genicular nerve RFA.  Risk and benefits reviewed and patient would like to proceed.  She is still obtaining pain relief after her left suprascapular nerve block, future considerations include suprascapular nerve RFA.  Orders Placed This Encounter  Procedures   Radiofrequency,Genicular    Standing Status:   Future    Standing Expiration Date:   12/08/2022    Scheduling Instructions:     Side(s): Bilateral Knee     Level(s): Superior-Lateral, Superior-Medial, and Inferior-Medial Genicular Nerve(s)     Sedation: with     Scheduling Timeframe: As soon as pre-approved    Order Specific Question:   Where will this procedure be performed?    Answer:   [facility]   Follow-up plan:   Return in about 22 days (around 09/30/2022) for B/L GN RFA, in clinic IV Versed.      Bilateral genicular nerve block, left suprascapular nerve block      Recent Visits Date Type Provider Dept  08/12/22 Procedure visit Edward Jolly, MD [facility]  07/14/22 Office Visit Edward Jolly, MD [facility]  07/02/22 Office Visit Edward Jolly, MD [facility]  Showing recent visits within past 90 days and meeting all other requirements Today's Visits Date Type Provider Dept  09/08/22 Office Visit Edward Jolly, MD [facility]  Showing today's visits and meeting all other requirements Future Appointments No visits were found meeting these conditions. Showing future appointments within next 90 days and meeting all other requirements  I discussed the assessment and treatment plan with the patient. The patient was provided an opportunity to ask questions and all were answered. The patient agreed with the plan and demonstrated an understanding of the instructions.  Patient advised to call back or seek an in-person evaluation if the symptoms or condition worsens.  Duration of encounter: .  Total time on encounter, as per AMA guidelines included both the face-to-face and non-face-to-face time  personally spent by the physician and/or other qualified health care professional(s) on the day of the encounter (includes time in activities that require the physician or other qualified health care professional and does not include time in activities normally performed by clinical staff). Physician's time may include the following activities when performed: Preparing to see the patient (e.g., pre-charting review of records, searching for previously ordered imaging, lab work, and nerve conduction tests) Review of prior analgesic pharmacotherapies. Reviewing PMP Interpreting ordered tests (e.g., lab work, imaging, nerve conduction tests) Performing post-procedure evaluations, including interpretation of diagnostic procedures Obtaining and/or reviewing separately obtained history Performing a medically appropriate  examination and/or evaluation Counseling and educating the patient/family/caregiver Ordering medications, tests, or procedures Referring and communicating with other health care professionals (when not separately reported) Documenting clinical information in the electronic or other health record Independently interpreting results (not separately reported) and communicating results to the patient/ family/caregiver Care coordination (not separately reported)  Note by: Edward Jolly, MD Date: 09/08/2022; Time: 4:11 PM

## 2022-09-09 ENCOUNTER — Ambulatory Visit
Admission: RE | Admit: 2022-09-09 | Discharge: 2022-09-09 | Disposition: A | Discharge status: Home / Self Care | Payer: Medicare HMO | Source: Ambulatory Visit | Attending: Student in an Organized Health Care Education/Training Program | Admitting: Student in an Organized Health Care Education/Training Program

## 2022-09-09 DIAGNOSIS — M1711 Unilateral primary osteoarthritis, right knee: Secondary | ICD-10-CM | POA: Insufficient documentation

## 2022-09-09 DIAGNOSIS — R911 Solitary pulmonary nodule: Secondary | ICD-10-CM | POA: Insufficient documentation

## 2022-09-09 DIAGNOSIS — M797 Fibromyalgia: Secondary | ICD-10-CM | POA: Diagnosis not present

## 2022-09-09 DIAGNOSIS — M1712 Unilateral primary osteoarthritis, left knee: Secondary | ICD-10-CM | POA: Insufficient documentation

## 2022-09-09 DIAGNOSIS — M23203 Derangement of unspecified medial meniscus due to old tear or injury, right knee: Secondary | ICD-10-CM | POA: Diagnosis not present

## 2022-09-09 DIAGNOSIS — R918 Other nonspecific abnormal finding of lung field: Secondary | ICD-10-CM | POA: Diagnosis not present

## 2022-09-10 ENCOUNTER — Other Ambulatory Visit: Payer: Self-pay | Admitting: Family Medicine

## 2022-09-10 DIAGNOSIS — I10 Essential (primary) hypertension: Secondary | ICD-10-CM

## 2022-09-10 NOTE — Telephone Encounter (Signed)
Medication Refill - Medication: lisinopril (ZESTRIL) 2.5 MG tablet   [employer] with Monia Pouch called requesting for the patient to receive 100 day quantity to align with an offer from the insurance. Best contact: [phone]  Has the patient contacted their pharmacy? Yes.   (Agent: If no, request that the patient contact the pharmacy for the refill. If patient does not wish to contact the pharmacy document the reason why and proceed with request.) (Agent: If yes, when and what did the pharmacy advise?)  Preferred Pharmacy (with phone number or street name):  [facility] - [place], [place] - [address] Portal to Reg[facility]  [address] [place] [address]  Phone: [phone] Fax: [phone]   Has the patient been seen for an appointment in the last year OR does the patient have an upcoming appointment? Yes.    Agent: Please be advised that RX refills may take up to 3 business days. We ask that you follow-up with your pharmacy.

## 2022-09-11 ENCOUNTER — Encounter: Payer: Self-pay | Admitting: Internal Medicine

## 2022-09-11 ENCOUNTER — Encounter: Payer: Self-pay | Admitting: Family Medicine

## 2022-09-11 ENCOUNTER — Other Ambulatory Visit: Payer: Self-pay

## 2022-09-11 DIAGNOSIS — R911 Solitary pulmonary nodule: Secondary | ICD-10-CM

## 2022-09-11 DIAGNOSIS — N907 Vulvar cyst: Secondary | ICD-10-CM

## 2022-09-11 MED ORDER — LISINOPRIL 2.5 MG PO TABS
2.5000 mg | ORAL_TABLET | Freq: Every day | ORAL | 0 refills | Status: AC
Start: 2022-09-11 — End: ?

## 2022-09-11 NOTE — Progress Notes (Signed)
Ref placed to GYN 

## 2022-09-11 NOTE — Telephone Encounter (Signed)
Dr. Belia Heman, please advise on CT results.

## 2022-09-11 NOTE — Telephone Encounter (Signed)
Requested Prescriptions  Pending Prescriptions Disp Refills   lisinopril (ZESTRIL) 2.5 MG tablet 100 tablet 0    Sig: Take 1 tablet (2.5 mg total) by mouth daily.     Cardiovascular:  ACE Inhibitors Failed - 09/10/2022  5:55 PM      Failed - K in normal range and within 180 days    Potassium  Date Value Ref Range Status  04/28/2022 5.5 (H) 3.5 - 5.2 mmol/L Final         Passed - Cr in normal range and within 180 days    Creatinine, Ser  Date Value Ref Range Status  04/28/2022 0.99 0.57 - 1.00 mg/dL Final         Passed - Patient is not pregnant      Passed - Last BP in normal range    BP Readings from Last 1 Encounters:  09/08/22 126/87         Passed - Valid encounter within last 6 months    Recent Outpatient Visits           1 month ago Rash   [facility] In[facility], [facility]   2 months ago Pulmonary nodule 1 cm or greater in diameter   [facility], [facility]   4 months ago Essential hypertension   [facility], [facility]   4 months ago Osteoarthritis of right knee, unspecified osteoarthritis type   [facility], Ocie Bob, MD   5 months ago Osteoarthritis of right knee, unspecified osteoarthritis type   [facility], Ocie Bob, MD       Future Appointments             In 3 weeks Erin Fulling, MD [facility] [place]   In 1 month Duanne Limerick, MD [facility] at [facility]

## 2022-09-14 NOTE — Telephone Encounter (Signed)
Spoke to patient via telephone and relayed results/recommendations. She agrees with plan.  PET has been ordered.  Nothing further needed.

## 2022-09-16 ENCOUNTER — Encounter: Payer: Self-pay | Admitting: Student in an Organized Health Care Education/Training Program

## 2022-09-17 DIAGNOSIS — H01021 Squamous blepharitis right upper eyelid: Secondary | ICD-10-CM | POA: Diagnosis not present

## 2022-09-17 DIAGNOSIS — H01024 Squamous blepharitis left upper eyelid: Secondary | ICD-10-CM | POA: Diagnosis not present

## 2022-09-17 DIAGNOSIS — D485 Neoplasm of uncertain behavior of skin: Secondary | ICD-10-CM | POA: Diagnosis not present

## 2022-09-17 DIAGNOSIS — H16223 Keratoconjunctivitis sicca, not specified as Sjogren's, bilateral: Secondary | ICD-10-CM | POA: Diagnosis not present

## 2022-09-17 DIAGNOSIS — H0288A Meibomian gland dysfunction right eye, upper and lower eyelids: Secondary | ICD-10-CM | POA: Diagnosis not present

## 2022-09-17 DIAGNOSIS — H0288B Meibomian gland dysfunction left eye, upper and lower eyelids: Secondary | ICD-10-CM | POA: Diagnosis not present

## 2022-09-23 ENCOUNTER — Ambulatory Visit
Admission: RE | Admit: 2022-09-23 | Discharge: 2022-09-23 | Disposition: A | Discharge status: Home / Self Care | Payer: Medicare HMO | Source: Ambulatory Visit | Attending: Internal Medicine | Admitting: Internal Medicine

## 2022-09-23 DIAGNOSIS — R911 Solitary pulmonary nodule: Secondary | ICD-10-CM | POA: Diagnosis not present

## 2022-09-23 DIAGNOSIS — Z853 Personal history of malignant neoplasm of breast: Secondary | ICD-10-CM | POA: Diagnosis not present

## 2022-09-23 LAB — GLUCOSE, CAPILLARY: Glucose-Capillary: 132 mg/dL — ABNORMAL HIGH (ref 70–99)

## 2022-09-23 MED ORDER — FLUDEOXYGLUCOSE F - 18 (FDG) INJECTION
8.8000 | Freq: Once | INTRAVENOUS | Status: AC
  Administered 2022-09-23: 9.13 via INTRAVENOUS

## 2022-09-29 DIAGNOSIS — L821 Other seborrheic keratosis: Secondary | ICD-10-CM | POA: Diagnosis not present

## 2022-09-29 DIAGNOSIS — D485 Neoplasm of uncertain behavior of skin: Secondary | ICD-10-CM | POA: Diagnosis not present

## 2022-09-29 DIAGNOSIS — L989 Disorder of the skin and subcutaneous tissue, unspecified: Secondary | ICD-10-CM | POA: Diagnosis not present

## 2022-10-05 ENCOUNTER — Ambulatory Visit: Payer: Medicare HMO | Admitting: Student in an Organized Health Care Education/Training Program

## 2022-10-06 ENCOUNTER — Ambulatory Visit: Payer: Medicare HMO | Admitting: Family Medicine

## 2022-10-06 ENCOUNTER — Ambulatory Visit (INDEPENDENT_AMBULATORY_CARE_PROVIDER_SITE_OTHER): Payer: Medicare HMO | Admitting: Internal Medicine

## 2022-10-06 ENCOUNTER — Encounter: Payer: Self-pay | Admitting: Internal Medicine

## 2022-10-06 VITALS — BP 130/70 | HR 91 | Temp 97.7°F | Ht 64.0 in | Wt 170.2 lb

## 2022-10-06 DIAGNOSIS — R9389 Abnormal findings on diagnostic imaging of other specified body structures: Secondary | ICD-10-CM | POA: Diagnosis not present

## 2022-10-06 NOTE — Progress Notes (Signed)
[facility] Consultation      Date: 10/06/2022,   MRN# [id number] Sharon Kaufman 1951-03-08     CHIEF COMPLAINT:   Assessment for abnormal CT scan   HISTORY OF PRESENT ILLNESS   72 year old pleasant white female seen today for abnormal CT chest CT scan reviewed in detail with patient from February 2024 Follow Up CT CHEST 08/2022 showed 1.5 cm nodular opacification in the right lower lobe which has not significantly changed  Follow-up PET scan May 2024 Very mild uptake in the right lower lobe opacity SUV 2.9 postinfectious inflammatory is most likely cause Recommend follow-up CT chest in 3 months  Previous HPI Patient underwent MRI of the liver to assess for nonalcoholic steatosis of the liver Incidental finding in the right lower lobe noted Patient has a 1.5 cm nodular opacification in the right lower lobe At the time of scan, patient may have had symptoms of sinus infection and was given antibiotics   No exacerbation at this time No evidence of heart failure at this time No evidence or signs of infection at this time No respiratory distress No fevers, chills, nausea, vomiting, diarrhea No evidence of lower extremity edema No evidence hemoptysis   PAST MEDICAL HISTORY   Past Medical History:  Diagnosis Date   Allergy    Anxiety    Breast cancer (HCC)    Breast cancer, left (HCC) 2008   Diabetes mellitus without complication (HCC)    GERD (gastroesophageal reflux disease)    Hyperlipidemia    Hypertension    Personal history of radiation therapy 2008   Left Breast Cancer     SURGICAL HISTORY   Past Surgical History:  Procedure Laterality Date   ANKLE FUSION Left 2009   APPENDECTOMY  2009   BREAST LUMPECTOMY Left 2008   REDUCTION MAMMAPLASTY Right 2014   ROTATOR CUFF REPAIR Left 2011   SPLENECTOMY  1973   TOTAL SHOULDER REPLACEMENT Right 2019     FAMILY HISTORY   Family History  Problem Relation Age of Onset   Rheum  arthritis Mother    Alcohol abuse Father    Liver disease Father      SOCIAL HISTORY   Social History   Tobacco Use   Smoking status: Former    Types: Cigarettes    Quit date: 2008    Years since quitting: 16.3   Smokeless tobacco: Never  Vaping Use   Vaping Use: Never used  Substance Use Topics   Alcohol use: Yes    Alcohol/week: 2.0 standard drinks of alcohol    Types: 2 Glasses of wine per week   Drug use: Never     MEDICATIONS    Home Medication:  Current Outpatient Rx   Order #: [id number] Class: Historical Med   Order #: [id number] Class: Normal   Order #: [id number] Class: Historical Med   Order #: [id number] Class: Historical Med   Order #: [id number] Class: Historical Med   Order #: [id number] Class: Historical Med   Order #: [id number] Class: Historical Med   Order #: [id number] Class: Historical Med   Order #: [id number] Class: Historical Med   Order #: [id number] Class: Historical Med   Order #: [id number] Class: Historical Med   Order #: [id number] Class: No Print   Order #: [id number] Class: Historical Med   Order #: [id number] Class: Historical Med   Order #: [id number] Class: Normal   Order #: [id number] Class: Normal   Order #: [id number] Class: Historical Med   Order #: [id number] Class: Historical Med   Order #: [id number] Class: Historical Med   Order #:  [id number] Class: Historical Med   Order #: [id number] Class: Historical Med    Current Medication:  Current Outpatient Medications:    Accu-Chek FastClix Lancets MISC, 1 each by Other route daily., Disp: , Rfl:    ACCU-CHEK GUIDE test strip, USE 1 DAILY., Disp: 100 strip, Rfl: 1   acetaminophen (TYLENOL) 650 MG CR tablet, Take 650 mg by mouth every 8 (eight) hours as needed for pain., Disp: , Rfl:    Ascorbic Acid (VITAMIN C) 1000 MG tablet, Take 1,000 mg by mouth daily., Disp: , Rfl:    aspirin 81 MG EC tablet, Take 81 mg by mouth daily., Disp: , Rfl:    Bioflavonoid Products (QUERCETIN COMPLEX IMMUNE PO), Take by mouth.,  Disp: , Rfl:    Biotin 10 MG CAPS, Take 10 mg by mouth daily., Disp: , Rfl:    CALCIUM-VITAMIN D PO, Take 1 tablet by mouth daily., Disp: , Rfl:    Carboxymethylcellul-Glycerin (REFRESH RELIEVA OP), Apply 1 drop to eye daily., Disp: , Rfl:    co-enzyme Q-10 30 MG capsule, Take 30 mg by mouth 3 (three) times daily., Disp: , Rfl:    Famotidine (PEPCID PO), Take by mouth., Disp: , Rfl:    lisinopril (ZESTRIL) 2.5 MG tablet, Take 1 tablet (2.5 mg total) by mouth daily., Disp: 100 tablet, Rfl: 0   LYSINE PO, Take 1 tablet by mouth daily., Disp: , Rfl:    MAGNESIUM PO, Take by mouth at bedtime., Disp: , Rfl:    metFORMIN (GLUCOPHAGE-XR) 750 MG 24 hr tablet, TAKE 2 TABLETS EVERY DAY WITH BREAKFAST (Patient taking differently: 750 mg daily with breakfast.), Disp: 180 tablet, Rfl: 0   Milnacipran HCl (SAVELLA) 25 MG TABS, Take 0.5 tablets (12.5 mg total) by mouth 2 (two) times daily for 3 days, THEN 1 tablet (25 mg total) 2 (two) times daily for 3 days, THEN 2 tablets (50 mg total) 2 (two) times daily., Disp: 145 tablet, Rfl: 0   Semaglutide,0.25 or 0.5MG /DOS, 2 MG/3ML SOPN, Inject into the skin., Disp: , Rfl:    VEVYE 0.1 % SOLN, Apply 1 drop to eye in the morning and at bedtime., Disp: , Rfl:    vitamin E 1000 UNIT capsule, Take 1,000 Units by mouth daily., Disp: , Rfl:    zinc gluconate 50 MG tablet, Take 50 mg by mouth daily., Disp: , Rfl:    zolpidem (AMBIEN) 5 MG tablet, Take 5 mg by mouth at bedtime. Psych, Disp: , Rfl:     ALLERGIES   Quetiapine, Alendronate, Atorvastatin, Codeine, Pravastatin, Risedronate, Hydrocodone-acetaminophen, and Amitriptyline        VS: BP 130/70 (BP Location: Left Arm, Cuff Size: Normal)   Pulse 91   Temp 97.7 F (36.5 C) (Temporal)   Ht 5\' 4"  (1.626 m)   Wt 170 lb 3.2 oz (77.2 kg)   SpO2 96%   BMI 29.21 kg/m       Review of Systems: Gen:  Denies  fever, sweats, chills weight loss  HEENT: Denies blurred vision, double vision, ear pain, eye pain,  hearing loss, nose bleeds, sore throat Cardiac:  No dizziness, chest pain or heaviness, chest tightness,edema, No JVD Resp:   No cough, -sputum production, -shortness of breath,-wheezing, -hemoptysis,  Other:  All other systems negative   Physical Examination:   General Appearance: No distress  EYES PERRLA, EOM intact.   NECK Supple, No JVD Pulmonary: normal breath sounds, No wheezing.  CardiovascularNormal S1,S2.  No m/r/g.   Abdomen: Benign, Soft, non-tender. Neurology UE/LE  5/5 strength, no focal deficits Ext pulses intact, cap refill intact ALL OTHER ROS ARE NEGATIVE        ASSESSMENT/PLAN    72 year old pleasant white female with abnormal CT chest with a right lower lobe nodular opacity measuring 1.5 cm there could be signs of some air bronchograms signs going through the opacity  CT chest from February 2024 and April 2024 and PET scan May 2024 all reviewed with patient in detail At this time it is safe to proceed with follow-up CT chest in 3 months for interval progression Patient is satisfied with this plan of action   Avoid secondhand smoke Avoid SICK contacts Recommend  Masking  when appropriate Recommend Keep up-to-date with vaccinations     MEDICATION ADJUSTMENTS/LABS AND TESTS ORDERED: CT chest 3 months   CURRENT MEDICATIONS REVIEWED AT LENGTH WITH PATIENT TODAY   Patient  satisfied with Plan of action and management. All questions answered  Follow-up in 3 months  Total Time Spent  25 mins   Alyn Riedinger Santiago Glad, M.D.  [facility]  Medical Director [facility] Medical Director [facility] Cardio-Pulmonary Department

## 2022-10-06 NOTE — Patient Instructions (Addendum)
Follow up CT chest in 3 months  

## 2022-10-07 ENCOUNTER — Ambulatory Visit: Payer: Medicare HMO | Admitting: Internal Medicine

## 2022-10-07 ENCOUNTER — Ambulatory Visit
Payer: Medicare HMO | Attending: Student in an Organized Health Care Education/Training Program | Admitting: Student in an Organized Health Care Education/Training Program

## 2022-10-07 ENCOUNTER — Encounter: Payer: Self-pay | Admitting: Gastroenterology

## 2022-10-07 ENCOUNTER — Other Ambulatory Visit: Payer: Self-pay

## 2022-10-07 ENCOUNTER — Encounter: Payer: Self-pay | Admitting: Student in an Organized Health Care Education/Training Program

## 2022-10-07 ENCOUNTER — Ambulatory Visit
Admission: RE | Admit: 2022-10-07 | Discharge: 2022-10-07 | Disposition: A | Discharge status: Home / Self Care | Payer: Medicare HMO | Source: Ambulatory Visit | Attending: Student in an Organized Health Care Education/Training Program | Admitting: Student in an Organized Health Care Education/Training Program

## 2022-10-07 DIAGNOSIS — M23203 Derangement of unspecified medial meniscus due to old tear or injury, right knee: Secondary | ICD-10-CM | POA: Diagnosis not present

## 2022-10-07 DIAGNOSIS — M1711 Unilateral primary osteoarthritis, right knee: Secondary | ICD-10-CM | POA: Diagnosis not present

## 2022-10-07 DIAGNOSIS — M1712 Unilateral primary osteoarthritis, left knee: Secondary | ICD-10-CM | POA: Diagnosis not present

## 2022-10-07 DIAGNOSIS — K769 Liver disease, unspecified: Secondary | ICD-10-CM

## 2022-10-07 DIAGNOSIS — K7460 Unspecified cirrhosis of liver: Secondary | ICD-10-CM

## 2022-10-07 MED ORDER — DICLOFENAC SODIUM 75 MG PO TBEC: 75.0000 mg | DELAYED_RELEASE_TABLET | Freq: Two times a day (BID) | ORAL | 1 refills | Status: DC | PRN

## 2022-10-07 MED ORDER — LIDOCAINE HCL 2 % IJ SOLN
20.0000 mL | Freq: Once | INTRAMUSCULAR | Status: AC
  Administered 2022-10-07: 400 mg
  Filled 2022-10-07: qty 40

## 2022-10-07 MED ORDER — MIDAZOLAM HCL 2 MG/2ML IJ SOLN
0.5000 mg | Freq: Once | INTRAMUSCULAR | Status: AC
  Administered 2022-10-07: 2 mg via INTRAVENOUS
  Filled 2022-10-07: qty 2

## 2022-10-07 MED ORDER — ROPIVACAINE HCL 2 MG/ML IJ SOLN
9.0000 mL | Freq: Once | INTRAMUSCULAR | Status: AC
  Administered 2022-10-07: 9 mL via PERINEURAL
  Filled 2022-10-07: qty 20

## 2022-10-07 MED ORDER — DEXAMETHASONE SODIUM PHOSPHATE 10 MG/ML IJ SOLN
10.0000 mg | Freq: Once | INTRAMUSCULAR | Status: AC
  Administered 2022-10-07: 10 mg
  Filled 2022-10-07: qty 1

## 2022-10-07 MED ORDER — LACTATED RINGERS IV SOLN: Freq: Once | INTRAVENOUS | Status: AC

## 2022-10-07 NOTE — Progress Notes (Signed)
Safety precautions to be maintained throughout the outpatient stay will include: orient to surroundings, keep bed in low position, maintain call bell within reach at all times, provide assistance with transfer out of bed and ambulation.  

## 2022-10-07 NOTE — Progress Notes (Signed)
PROVIDER NOTE: Interpretation of information contained herein should be left to medically-trained personnel. Specific patient instructions are provided elsewhere under "Patient Instructions" section of medical record. This document was created in part using STT-dictation technology, any transcriptional errors that may result from this process are unintentional.  Patient: Sharon Kaufman Type: Established DOB: [birth year] MRN: [id number] PCP: Duanne Limerick, MD  Service: Procedure DOS: 10/07/2022 Setting: Ambulatory Location: Ambulatory outpatient facility Delivery: Face-to-face Provider: Edward Jolly, MD Specialty: Interventional Pain Management Specialty designation: 09 Location: Outpatient facility Ref. Prov.: Edward Jolly, MD       Interventional Therapy   Primary Reason for Visit: Interventional Pain Management Treatment. CC: Knee Pain    Procedure:          Anesthesia, Analgesia, Anxiolysis:  Type: Therapeutic Superolateral, Superomedial, and Inferomedial, Genicular Nerve Radiofrequency Ablation (destruction).   #1  Region: Lateral, Anterior, and Medial aspects of the knee joint, above and below the knee joint proper. Level: Superior and inferior to the knee joint. Laterality: Bilateral  Anesthesia: Local (1-2% Lidocaine)  Anxiolysis: 2mg  IV Versed Guidance: Fluoroscopy           Position: Supine   Indications: 1. Primary osteoarthritis of right knee   2. Arthritis of left knee   3. 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 Over-the-counter medications 2. Anti-inflammatory medications 3. Muscle relaxants 4. Membrane stabilizers 5. Opioids 6. Physical therapy and/or chiropractic manipulation 7.  Modalities (Heat, ice, etc.) 8. Invasive techniques such as nerve blocks. Sharon Kaufman has attained more than 50% relief of the pain from a series of diagnostic injections conducted in separate occasions.  Pain Score: Pre-procedure: 7 /10 Post-procedure: 7 /10    Pre-op H&P Assessment:  Sharon Kaufman is a 72 y.o. (year old), female patient, seen today for interventional treatment. She  has a past surgical history that includes Splenectomy (1973); Appendectomy (2009); Breast lumpectomy (Left, 2008); Ankle Fusion (Left, 2009); Rotator cuff repair (Left, 2011); Total shoulder replacement (Right, 2019); and Reduction mammaplasty (Right, 2014). Sharon Kaufman has a current medication list which includes the following prescription(s): accu-chek fastclix lancets, accu-chek guide, acetaminophen, vitamin c, aspirin ec, bioflavonoid products, biotin, calcium-vitamin d, carboxymethylcellul-glycerin, co-enzyme q-10, famotidine, lisinopril, lysine, magnesium, metformin, semaglutide(0.25 or 0.5mg /dos), vevye, vitamin e, zinc gluconate, zolpidem, and diclofenac. Her primarily concern today is the Knee Pain  Initial Vital Signs:  Pulse/HCG Rate: 86ECG Heart Rate: 91 Temp: (!) 97.4 F (36.3 C) Resp: 18 BP: (!) 167/88 SpO2: 99 %  BMI: Estimated body mass index is 29.18 kg/m as calculated from the following:   Height as of this encounter: 5\' 4"  (1.626 m).   Weight as of this encounter: 170 lb (77.1 kg).  Risk Assessment: Allergies: Reviewed. She is allergic to quetiapine, alendronate, atorvastatin, codeine, pravastatin, risedronate, hydrocodone-acetaminophen, and amitriptyline.  Allergy Precautions: None required Coagulopathies: Reviewed. None identified.  Blood-thinner therapy: None at this time Active Infection(s): Reviewed. None identified. Sharon Kaufman is afebrile  Site Confirmation: Sharon Kaufman was asked to confirm the procedure and laterality before marking the site Procedure checklist:  Completed Consent: Before the procedure and under the influence of no sedative(s), amnesic(s), or anxiolytics, the patient was informed of the treatment options, risks and possible complications. To  fulfill our ethical and legal obligations, as recommended by the American Medical Association's Code of Ethics, I have informed the patient of my clinical impression; the nature and purpose of the treatment or procedure; the risks, benefits, and possible complications of the intervention; the alternatives, including doing nothing; the risk(s) and benefit(s) of the alternative treatment(s) or procedure(s); and the risk(s) and benefit(s) of doing nothing. The patient was provided information about the general risks and possible complications associated with the procedure. These may include, but are not limited to: failure to achieve desired goals, infection, bleeding, organ or nerve damage, allergic reactions, paralysis, and death. In addition, the patient was informed of those risks and complications associated to the procedure, such as failure to decrease pain; infection; bleeding; organ or nerve damage with subsequent damage to sensory, motor, and/or autonomic systems, resulting in permanent pain, numbness, and/or weakness of one or several areas of the body; allergic reactions; (i.e.: anaphylactic reaction); and/or death. Furthermore, the patient was informed of those risks and complications associated with the medications. These include, but are not limited to: allergic reactions (i.e.: anaphylactic or anaphylactoid reaction(s)); adrenal axis suppression; blood sugar elevation that in diabetics may result in ketoacidosis or comma; water retention that in patients with history of congestive heart failure may result in shortness of breath, pulmonary edema, and decompensation with resultant heart failure; weight gain; swelling or edema; medication-induced neural toxicity; particulate matter embolism and blood vessel  occlusion with resultant organ, and/or nervous system infarction; and/or aseptic necrosis of one or more joints. Finally, the patient was informed that Medicine is not an exact science; therefore, there is also the possibility of unforeseen or unpredictable risks and/or possible complications that may result in a catastrophic outcome. The patient indicated having understood very clearly. We have given the patient no guarantees and we have made no promises. Enough time was given to the patient to ask questions, all of which were answered to the patient's satisfaction. Sharon Kaufman has indicated that she wanted to continue with the procedure. Attestation: I, the ordering provider, attest that I have discussed with the patient the benefits, risks, side-effects, alternatives, likelihood of achieving goals, and potential problems during recovery for the procedure that I have provided informed consent. Date  Time: 10/07/2022 10:29 AM  Pre-Procedure Preparation:  Monitoring: As per clinic protocol. Respiration, ETCO2, SpO2, BP, heart rate and rhythm monitor placed and checked for adequate function Safety Precautions: Patient was assessed for positional comfort and pressure points before starting the procedure. Time-out: I initiated and conducted the "Time-out" before starting the procedure, as per protocol. The patient was asked to participate by confirming the accuracy of the "Time Out" information. Verification of the correct person, site, and procedure were performed and confirmed by me, the nursing staff, and the patient. "Time-out" conducted as per Joint Commission's Universal Protocol (UP.01.01.01). Time: 1154 Start Time: 1154 hrs.  Description of Procedure:          Target Area: For Genicular Nerve radiofrequency ablation (destruction), the targets are: the superolateral genicular nerve, located in the lateral distal portion of the femoral shaft as it curves to form the lateral epicondyle, in the region  of the distal femoral metaphysis; the superomedial genicular nerve, located in the medial distal portion of the femoral shaft as it curves to form the medial epicondyle; and the inferomedial genicular nerve, located in the medial, proximal portion of the tibial shaft, as it curves to form the medial epicondyle, in the region of the proximal tibial metaphysis.  Approach: Anterior, ipsilateral approach. Area Prepped: Entire knee area, from mid-thigh to mid-shin, lateral, anterior, and medial aspects. DuraPrep (Iodine Povacrylex [0.7% available iodine] and Isopropyl Alcohol, 74% w/w) Safety Precautions: Aspiration looking for blood return was conducted prior to all injections. At no point did we inject any substances, as a needle was being advanced. No attempts were made at seeking any paresthesias. Safe injection practices and needle disposal techniques used. Medications properly checked for expiration dates. SDV (single dose vial) medications used. Description of the Procedure: Protocol guidelines were followed. The patient was placed in position over the procedure table. The target area was identified and the area prepped in the usual manner. The skin and muscle were infiltrated with local anesthetic. Appropriate amount of time allowed to pass for local anesthetics to take effect. Radiofrequency needles were introduced to the target area using fluoroscopic guidance. Using the  [employer]  Radiofrequency Generator, sensory stimulation using 50 Hz was used to locate & identify the nerve, making sure that the needle was positioned such that there was no sensory stimulation below 0.3 V or above 0.7 V. Stimulation using 2 Hz was used to evaluate the motor component. Care was taken not to lesion any nerves that demonstrated motor stimulation of the lower extremities at an output of less than 2.5 times that of the sensory threshold, or a maximum of 2.0 V. Once satisfactory placement of the needles was achieved, the  numbing solution was slowly injected after negative aspiration. After waiting for at least 2 minutes, the ablation was performed at 80 degrees C for 60 seconds, using regular Radiofrequency settings. Once the procedure was completed, the needles were then removed and the area cleansed, making sure to leave some of the prepping solution back to take advantage of its long term bactericidal properties. Intra-operative Compliance: Compliant      Vitals:   10/07/22 1212 10/07/22 1217 10/07/22 1222 10/07/22 1232  BP: (!) 160/94 (!) 170/92 (!) 152/108 (!) 168/93  Pulse:    85  Resp: 17 16 17 16   Temp:    (!) 97.3 F (36.3 C)  TempSrc:    Temporal  SpO2: 95% 95% 99% 98%  Weight:      Height:        Start Time: 1154 hrs. End Time: 1222 hrs. Materials & Medications:  Needle(s) Type: Teflon-coated, curved tip, Radiofrequency needle(s) Gauge: 22G Length: 10cm Medication(s): Please see orders for medications and dosing details.  6 cc solution made of 5 cc of 0.2% ropivacaine, 1 cc of Decadron 10 mg/cc.  2 cc injected at each level above for the left genicular nerves after sensorimotor testing, prior to lesioning 6 cc solution made of 5 cc of 0.2% ropivacaine, 1 cc of Decadron 10 mg/cc.  2 cc injected at each level above for the right genicular nerves after sensorimotor testing, prior to lesioning Imaging Guidance (Non-Spinal):          Type of Imaging Technique: Fluoroscopy Guidance (Non-Spinal) Indication(s): Assistance in needle guidance and placement for procedures requiring needle placement in or near specific anatomical locations not easily accessible without such assistance. Exposure Time: Please see nurses notes. Contrast: None used. Fluoroscopic Guidance: I was personally present during the use of fluoroscopy. "Tunnel Vision Technique" used to obtain the best possible view of the target area. Parallax error corrected before commencing the procedure. "Direction-depth-direction" technique  used to introduce the needle under continuous pulsed fluoroscopy. Once target was reached, antero-posterior, oblique, and lateral fluoroscopic projection used confirm needle placement in  all planes. Images permanently stored in EMR. Interpretation: No contrast injected.  Antibiotic Prophylaxis:   Anti-infectives (From admission, onward)    None      Indication(s): None identified  Post-operative Assessment:  Post-procedure Vital Signs:  Pulse/HCG Rate: 8597 Temp: (!) 97.3 F (36.3 C) Resp: 16 BP: (!) 168/93 SpO2: 98 %  EBL: None  Complications: No immediate post-treatment complications observed by team, or reported by patient.  Note: The patient tolerated the entire procedure well. A repeat set of vitals were taken after the procedure and the patient was kept under observation following institutional policy, for this type of procedure. Post-procedural neurological assessment was performed, showing return to baseline, prior to discharge. The patient was provided with post-procedure discharge instructions, including a section on how to identify potential problems. Should any problems arise concerning this procedure, the patient was given instructions to immediately contact us, at any time, without hesitation. In any case, we plan to contact the patient by telephone for a follow-up status report regarding this interventional procedure.  Comments:  No additional relevant information.  Plan of Care (POC)  Orders:  Orders Placed This Encounter  Procedures   DG PAIN CLINIC C-ARM 1-60 MIN NO REPORT    Intraoperative interpretation by procedural physician at [facility].    Standing Status:   Standing    Number of Occurrences:   1    Order Specific Question:   Reason for exam:    Answer:   Assistance in needle guidance and placement for procedures requiring needle placement in or near specific anatomical locations not easily accessible without such assistance.   Ambulatory  referral to Physical Therapy    Referral Priority:   Routine    Referral Type:   Physical Medicine    Referral Reason:   Specialty Services Required    Requested Specialty:   Physical Therapy    Number of Visits Requested:   1    Medications ordered for procedure: Meds ordered this encounter  Medications   lidocaine (XYLOCAINE) 2 % (with pres) injection 400 mg   lactated ringers infusion   midazolam (VERSED) injection 0.5-2 mg    Make sure Flumazenil is available in the pyxis when using this medication. If oversedation occurs, administer 0.2 mg IV over 15 sec. If after 45 sec no response, administer 0.2 mg again over 1 min; may repeat at 1 min intervals; not to exceed 4 doses (1 mg)   dexamethasone (DECADRON) injection 10 mg   dexamethasone (DECADRON) injection 10 mg   ropivacaine (PF) 2 mg/mL (0.2%) (NAROPIN) injection 9 mL   ropivacaine (PF) 2 mg/mL (0.2%) (NAROPIN) injection 9 mL   diclofenac (VOLTAREN) 75 MG EC tablet    Sig: Take 1 tablet (75 mg total) by mouth 2 (two) times daily as needed.    Dispense:  180 tablet    Refill:  1   Medications administered: We administered lidocaine, lactated ringers, midazolam, dexamethasone, dexamethasone, ropivacaine (PF) 2 mg/mL (0.2%), and ropivacaine (PF) 2 mg/mL (0.2%).  See the medical record for exact dosing, route, and time of administration.  Follow-up plan:   Return in about 6 weeks (around 11/18/2022) for Post Procedure Evaluation, in person.       Bilateral genicular nerve block, left suprascapular nerve block, Bilateral GN RFA 10/07/22      Recent Visits Date Type Provider Dept  09/08/22 Office Visit Edward Jolly, MD [facility]  08/12/22 Procedure visit Edward Jolly, MD [facility]  07/14/22 Office Visit  Edward Jolly, MD [facility]  Showing recent visits within past 90 days and meeting all other requirements Today's Visits Date Type Provider Dept  10/07/22 Procedure visit Edward Jolly,  MD [facility]  Showing today's visits and meeting all other requirements Future Appointments Date Type Provider Dept  11/18/22 Appointment Edward Jolly, MD [facility]  Showing future appointments within next 90 days and meeting all other requirements  Disposition: Discharge home  Discharge (Date  Time): 10/07/2022; 1234 hrs.   Primary Care Physician: Duanne Limerick, MD Location: [facility] Note by: Edward Jolly, MD (TTS technology used. I apologize for any typographical errors that were not detected and corrected.) Date: 10/07/2022; Time: 2:41 PM  Disclaimer:  Medicine is not an Visual merchandiser. The only guarantee in medicine is that nothing is guaranteed. It is important to note that the decision to proceed with this intervention was based on the information collected from the patient. The Data and conclusions were drawn from the patient's questionnaire, the interview, and the physical examination. Because the information was provided in large part by the patient, it cannot be guaranteed that it has not been purposely or unconsciously manipulated. Every effort has been made to obtain as much relevant data as possible for this evaluation. It is important to note that the conclusions that lead to this procedure are derived in large part from the available data. Always take into account that the treatment will also be dependent on availability of resources and existing treatment guidelines, considered by other Pain Management Practitioners as being common knowledge and practice, at the time of the intervention. For Medico-Legal purposes, it is also important to point out that variation in procedural techniques and pharmacological choices are the acceptable norm. The indications, contraindications, technique, and results of the above procedure should only be interpreted and judged by a Board-Certified Interventional Pain Specialist with extensive  familiarity and expertise in the same exact procedure and technique.

## 2022-10-07 NOTE — Patient Instructions (Signed)

## 2022-10-08 ENCOUNTER — Encounter: Payer: Self-pay | Admitting: Family Medicine

## 2022-10-08 ENCOUNTER — Telehealth: Payer: Self-pay | Admitting: *Deleted

## 2022-10-08 NOTE — Telephone Encounter (Signed)
Attempted to call for post procedure follow-up. Message left. 

## 2022-10-09 ENCOUNTER — Telehealth: Payer: Self-pay | Admitting: Student in an Organized Health Care Education/Training Program

## 2022-10-09 ENCOUNTER — Encounter: Payer: Self-pay | Admitting: Student in an Organized Health Care Education/Training Program

## 2022-10-09 ENCOUNTER — Other Ambulatory Visit: Payer: Self-pay

## 2022-10-09 DIAGNOSIS — F5101 Primary insomnia: Secondary | ICD-10-CM | POA: Diagnosis not present

## 2022-10-09 DIAGNOSIS — F411 Generalized anxiety disorder: Secondary | ICD-10-CM | POA: Diagnosis not present

## 2022-10-09 MED ORDER — METHYLPREDNISOLONE 4 MG PO TBPK: ORAL_TABLET | ORAL | 0 refills | Status: AC

## 2022-10-09 NOTE — Telephone Encounter (Signed)
Spoke with Dr Cherylann Ratel.  Verbal order given for Medrol dosepack along with Naproxen 500 mg bid prn and Acetaminophen 500 mg tid prn. Patient notified and states understanding

## 2022-10-09 NOTE — Telephone Encounter (Signed)
PT stated that her right knee is giving her a lot of pain. PT stated that she can barely walk. PT stated that she will be leaving out of town on tomorrow for work. Please give patient a call. TY

## 2022-10-12 ENCOUNTER — Encounter: Payer: Self-pay | Admitting: Student in an Organized Health Care Education/Training Program

## 2022-10-12 ENCOUNTER — Other Ambulatory Visit: Payer: Self-pay

## 2022-10-12 MED ORDER — DICLOFENAC SODIUM 75 MG PO TBEC: 75.0000 mg | DELAYED_RELEASE_TABLET | Freq: Two times a day (BID) | ORAL | 1 refills | Status: DC | PRN

## 2022-10-16 ENCOUNTER — Encounter: Payer: Self-pay | Admitting: Student in an Organized Health Care Education/Training Program

## 2022-10-16 NOTE — Progress Notes (Unsigned)
    GYNECOLOGY PROGRESS NOTE  Subjective:    Patient ID: Sharon Kaufman, female    DOB: 22-Dec-1950, 72 y.o.   MRN: [id number]  HPI  Patient is a 72 y.o. No obstetric history on file. female who presents for evaluation of labial cyst.  {Common ambulatory SmartLinks:19316}  Review of Systems {ros; complete:30496}   Objective:   There were no vitals taken for this visit. There is no height or weight on file to calculate BMI. General appearance: {general exam:16600} Abdomen: {abdominal exam:16834} Pelvic: {pelvic exam:16852::"cervix normal in appearance","external genitalia normal","no adnexal masses or tenderness","no cervical motion tenderness","rectovaginal septum normal","uterus normal size, shape, and consistency","vagina normal without discharge"} Extremities: {extremity exam:5109} Neurologic: {neuro exam:17854}   Assessment:   No diagnosis found.   Plan:   There are no diagnoses linked to this encounter.    Hildred Laser, MD [facility]

## 2022-10-20 ENCOUNTER — Ambulatory Visit (INDEPENDENT_AMBULATORY_CARE_PROVIDER_SITE_OTHER): Payer: Medicare HMO | Admitting: Obstetrics and Gynecology

## 2022-10-20 ENCOUNTER — Encounter: Payer: Self-pay | Admitting: Obstetrics and Gynecology

## 2022-10-20 ENCOUNTER — Other Ambulatory Visit (HOSPITAL_COMMUNITY)
Admission: RE | Admit: 2022-10-20 | Discharge: 2022-10-20 | Disposition: A | Discharge status: Home / Self Care | Payer: Medicare HMO | Source: Ambulatory Visit | Attending: Obstetrics and Gynecology | Admitting: Obstetrics and Gynecology

## 2022-10-20 VITALS — BP 145/79 | HR 94 | Resp 16 | Ht 64.0 in | Wt 160.6 lb

## 2022-10-20 DIAGNOSIS — N898 Other specified noninflammatory disorders of vagina: Secondary | ICD-10-CM | POA: Diagnosis not present

## 2022-10-20 DIAGNOSIS — N907 Vulvar cyst: Secondary | ICD-10-CM

## 2022-10-20 DIAGNOSIS — N9489 Other specified conditions associated with female genital organs and menstrual cycle: Secondary | ICD-10-CM

## 2022-10-20 MED ORDER — FLUCONAZOLE 150 MG PO TABS: 150.0000 mg | ORAL_TABLET | Freq: Once | ORAL | 3 refills | Status: AC

## 2022-10-22 DIAGNOSIS — H0288B Meibomian gland dysfunction left eye, upper and lower eyelids: Secondary | ICD-10-CM | POA: Diagnosis not present

## 2022-10-22 DIAGNOSIS — H16223 Keratoconjunctivitis sicca, not specified as Sjogren's, bilateral: Secondary | ICD-10-CM | POA: Diagnosis not present

## 2022-10-22 DIAGNOSIS — H0288A Meibomian gland dysfunction right eye, upper and lower eyelids: Secondary | ICD-10-CM | POA: Diagnosis not present

## 2022-10-22 LAB — CERVICOVAGINAL ANCILLARY ONLY
Bacterial Vaginitis (gardnerella): NEGATIVE
Candida Glabrata: NEGATIVE
Candida Vaginitis: NEGATIVE
Comment: NEGATIVE
Comment: NEGATIVE
Comment: NEGATIVE

## 2022-10-27 DIAGNOSIS — L298 Other pruritus: Secondary | ICD-10-CM | POA: Diagnosis not present

## 2022-11-02 ENCOUNTER — Encounter: Payer: Self-pay | Admitting: Student in an Organized Health Care Education/Training Program

## 2022-11-05 ENCOUNTER — Ambulatory Visit
Admission: RE | Admit: 2022-11-05 | Discharge: 2022-11-05 | Disposition: A | Discharge status: Home / Self Care | Payer: Medicare HMO | Source: Ambulatory Visit | Attending: Gastroenterology | Admitting: Gastroenterology

## 2022-11-05 DIAGNOSIS — K769 Liver disease, unspecified: Secondary | ICD-10-CM | POA: Diagnosis not present

## 2022-11-05 DIAGNOSIS — K7689 Other specified diseases of liver: Secondary | ICD-10-CM | POA: Diagnosis not present

## 2022-11-05 DIAGNOSIS — K7460 Unspecified cirrhosis of liver: Secondary | ICD-10-CM | POA: Diagnosis not present

## 2022-11-05 MED ORDER — GADOBUTROL 1 MMOL/ML IV SOLN
7.0000 mL | Freq: Once | INTRAVENOUS | Status: AC | PRN
  Administered 2022-11-05: 7 mL via INTRAVENOUS

## 2022-11-09 ENCOUNTER — Encounter: Payer: Self-pay | Admitting: Gastroenterology

## 2022-11-09 ENCOUNTER — Ambulatory Visit: Payer: Medicare HMO | Admitting: Family Medicine

## 2022-11-16 ENCOUNTER — Ambulatory Visit: Payer: Medicare HMO | Admitting: Family Medicine

## 2022-11-18 ENCOUNTER — Ambulatory Visit
Payer: Medicare HMO | Attending: Student in an Organized Health Care Education/Training Program | Admitting: Student in an Organized Health Care Education/Training Program

## 2022-11-18 ENCOUNTER — Encounter: Payer: Self-pay | Admitting: Student in an Organized Health Care Education/Training Program

## 2022-11-18 VITALS — BP 140/75 | HR 88 | Temp 97.8°F | Resp 18 | Ht 64.0 in | Wt 160.0 lb

## 2022-11-18 DIAGNOSIS — M1711 Unilateral primary osteoarthritis, right knee: Secondary | ICD-10-CM | POA: Diagnosis not present

## 2022-11-18 DIAGNOSIS — M23203 Derangement of unspecified medial meniscus due to old tear or injury, right knee: Secondary | ICD-10-CM | POA: Diagnosis not present

## 2022-11-18 MED ORDER — MELOXICAM 15 MG PO TABS
15.0000 mg | ORAL_TABLET | Freq: Every day | ORAL | 1 refills | Status: DC
Start: 2022-11-18 — End: 2023-01-15

## 2022-11-18 MED ORDER — METHOCARBAMOL 1000 MG/10ML IJ SOLN
200.0000 mg | Freq: Once | INTRAMUSCULAR | Status: AC
  Administered 2022-11-18: 200 mg via INTRAMUSCULAR
  Filled 2022-11-18: qty 10

## 2022-11-18 NOTE — Progress Notes (Signed)
PROVIDER NOTE: Information contained herein reflects review and annotations entered in association with encounter. Interpretation of such information and data should be left to medically-trained personnel. Information provided to patient can be located elsewhere in the medical record under "Patient Instructions". Document created using STT-dictation technology, any transcriptional errors that may result from process are unintentional.    Patient: Sharon Kaufman  Service Category: E/M  Provider: Edward Jolly, MD  DOB: November 11, 1950  DOS: 11/18/2022  Referring Provider: Duanne Limerick, MD  MRN: [id number]  Specialty: Interventional Pain Management  PCP: Duanne Limerick, MD  Type: Established Patient  Setting: Ambulatory outpatient    Location: Office  Delivery: Face-to-face     HPI  [name], a 72 y.o. year old female, is here today because of her Primary osteoarthritis of right knee [M17.11]. [name] primary complain today is Knee Pain (Right is greater than left)   Pain Assessment: Severity of Chronic pain is reported as a 9 /10. Location: Knee Right, Left/right inner thigh. Onset: More than a month ago. Quality: Aching, Burning, Throbbing, Spasm. Timing: Constant. Modifying factor(s): NSAIDS. Vitals:  height is 5\' 4"  (1.626 m) and weight is 160 lb (72.6 kg). Her temporal temperature is 97.8 F (36.6 C). Her blood pressure is 140/75 (abnormal) and her pulse is 88. Her respiration is 18 and oxygen saturation is 99%.  BMI: Estimated body mass index is 27.46 kg/m as calculated from the following:   Height as of this encounter: 5\' 4"  (1.626 m).   Weight as of this encounter: 160 lb (72.6 kg). Last encounter: 09/08/2022. Last procedure: 10/07/2022.  Reason for encounter: post-procedure evaluation and assessment.    Post-procedure evaluation    Procedure:          Anesthesia, Analgesia, Anxiolysis:  Type: Therapeutic Superolateral, Superomedial, and Inferomedial, Genicular Nerve  Radiofrequency Ablation (destruction).   #1  Region: Lateral, Anterior, and Medial aspects of the knee joint, above and below the knee joint proper. Level: Superior and inferior to the knee joint. Laterality: Bilateral  Anesthesia: Local (1-2% Lidocaine)  Anxiolysis: 2mg  IV Versed Guidance: Fluoroscopy           Position: Supine   Indications: 1. Primary osteoarthritis of right knee   2. Arthritis of left knee   3. Old tear of medial meniscus of right knee, unspecified tear type    [name] has been dealing with the above chronic pain for longer than three months and has either failed to respond, was unable to tolerate, or simply did not get enough benefit from other more conservative therapies including, but not limited to: 1. Over-the-counter medications 2. Anti-inflammatory medications 3. Muscle relaxants 4. Membrane stabilizers 5. Opioids 6. Physical therapy and/or chiropractic manipulation 7. Modalities (Heat, ice, etc.) 8. Invasive techniques such as nerve blocks. [name] has attained more than 50% relief of the pain from a series of diagnostic injections conducted in separate occasions.  Pain Score: Pre-procedure: 7 /10 Post-procedure: 7 /10     Effectiveness:  Initial hour after procedure: 50 % (left knee is 95% improved)  Subsequent 4-6 hours post-procedure: 50 % right knee, 95% for left knee Analgesia past initial 6 hours: 25% for right knee, 90% for left knee Ongoing improvement:  Analgesic:  <20% for right knee, >80% for left knee Function: Somewhat improved for left knee, not right ROM: Somewhat improved for left knee, not right     ROS  Constitutional: Denies any fever or chills Gastrointestinal: No reported hemesis, hematochezia,  vomiting, or acute GI distress Musculoskeletal:  Persistent right knee pain Neurological: No reported episodes of acute onset apraxia, aphasia, dysarthria, agnosia, amnesia, paralysis, loss of coordination, or loss of  consciousness  Medication Review  Accu-Chek FastClix Lancets, Bioflavonoid Products, Biotin, Calcium-Vitamin D, Carboxymethylcellul-Glycerin, DULoxetine, Famotidine, Lysine, Magnesium, Semaglutide(0.25 or 0.5MG /DOS), acetaminophen, aspirin EC, co-enzyme Q-10, diclofenac, glucose blood, lisinopril, meloxicam, metFORMIN, vitamin C, vitamin E, zinc gluconate, and zolpidem  History Review  Allergy: [name] is allergic to quetiapine, alendronate, atorvastatin, codeine, pravastatin, risedronate, hydrocodone-acetaminophen, and amitriptyline. Drug: [name]  reports no history of drug use. Alcohol:  reports current alcohol use of about 2.0 standard drinks of alcohol per week. Tobacco:  reports that she quit smoking about 16 years ago. Her smoking use included cigarettes. She has never used smokeless tobacco. Social: Sharon Kaufman  reports that she quit smoking about 16 years ago. Her smoking use included cigarettes. She has never used smokeless tobacco. She reports current alcohol use of about 2.0 standard drinks of alcohol per week. She reports that she does not use drugs. Medical:  has a past medical history of Allergy, Anxiety, Breast cancer (HCC), Breast cancer, left (HCC) (2008), Diabetes mellitus without complication (HCC), GERD (gastroesophageal reflux disease), Hyperlipidemia, Hypertension, and Personal history of radiation therapy (2008). Surgical: [name]  has a past surgical history that includes Splenectomy (1973); Appendectomy (2009); Breast lumpectomy (Left, 2008); Ankle Fusion (Left, 2009); Rotator cuff repair (Left, 2011); Total shoulder replacement (Right, 2019); and Reduction mammaplasty (Right, 2014). Family: family history includes Alcohol abuse in her father; Liver disease in her father; Rheum arthritis in her mother.  Laboratory Chemistry Profile   Renal Lab Results  Component Value Date   BUN 30 (H) 04/28/2022   CREATININE 0.99 04/28/2022   BCR 30 (H) 04/28/2022     Hepatic Lab Results  Component Value Date   AST 24 01/02/2022   ALT 20 01/02/2022   ALBUMIN 4.4 01/02/2022   ALKPHOS 87 01/02/2022    Electrolytes Lab Results  Component Value Date   NA 131 (L) 04/28/2022   K 5.5 (H) 04/28/2022   CL 98 04/28/2022   CALCIUM 9.3 04/28/2022   PHOS 2.7 (L) 12/02/2021    Bone Lab Results  Component Value Date   VD25OH 48.6 04/28/2022    Inflammation (CRP: Acute Phase) (ESR: Chronic Phase) No results found for: "CRP", "ESRSEDRATE", "LATICACIDVEN"       Note: Above Lab results reviewed.  Recent Imaging Review  MR LIVER W WO CONTRAST CLINICAL DATA:  Cirrhosis, follow-up LI-RADS category 3 liver lesions  EXAM: MRI ABDOMEN WITHOUT AND WITH CONTRAST  TECHNIQUE: Multiplanar multisequence MR imaging of the abdomen was performed both before and after the administration of intravenous contrast.  CONTRAST:  7mL GADAVIST GADOBUTROL 1 MMOL/ML IV SOLN  COMPARISON:  PET-CT, 09/23/2022, MR abdomen, 06/01/2022  FINDINGS: Lower chest: No acute abnormality.  Hepatobiliary: Unchanged, definitively benign intrinsically T1 and T2 intermediate hemorrhagic or proteinaceous subcapsular cysts overlying the posterior right lobe of the liver, measuring 4.1 x 2.5 cm (series 4, image 21). Coarse, nodular contour of the liver. Innumerable generally somewhat amorphous, wedge shaped and subcapsular transient foci of arterial hyperenhancement scattered throughout the liver, unchanged (series 14, image 37). These are not seen on remaining contrast phases and there is no evidence of washout or capsular enhancement. No gallstones, gallbladder wall thickening, or biliary dilatation.  Pancreas: Unremarkable. No pancreatic ductal dilatation or surrounding inflammatory changes.  Spleen: Status post splenectomy.  Adrenals/Urinary Tract: Adrenal glands are unremarkable.  Simple, benign left renal cortical cysts, for which no further follow-up or characterization is  required. Kidneys are otherwise normal, without obvious renal calculi, solid lesion, or hydronephrosis.  Stomach/Bowel: Stomach is within normal limits. No evidence of bowel wall thickening, distention, or inflammatory changes.  Vascular/Lymphatic: No significant vascular findings are present. No enlarged abdominal lymph nodes.  Other: No abdominal wall hernia. Unchanged, contrast enhancing nodule in the subcutaneous fat of the midline ventral abdomen measuring 0.8 x 0.8 cm (series 16, image 59). No ascites.  Musculoskeletal: No acute or significant osseous findings.  IMPRESSION: 1. Innumerable generally somewhat amorphous, wedge shaped and subcapsular transient foci of arterial hyperenhancement scattered throughout the liver, unchanged. These are not seen on remaining contrast phases and there is no evidence of washout or capsular enhancement. These most likely reflect a combination of benign, perfusional transient hepatic intensity differences and occasional regenerative or dysplastic nodules in the setting of cirrhosis, and remain categorized as LI-RADS category 3, intermediate probability for hepatocellular carcinoma. Recommend follow-up MR in 6 months for ongoing surveillance. 2. Unchanged, definitively benign hemorrhagic or proteinaceous subcapsular cyst overlying the posterior right lobe of the liver, for which no specific further follow-up or characterization is required. 3. Unchanged, contrast enhancing nodule in the subcutaneous fat of the midline ventral abdomen measuring 0.8 x 0.8 cm. 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  Electronically Signed   By: Jearld Lesch M.D.   On: 11/07/2022 22:15 Note: Reviewed        Physical Exam  General appearance: Well nourished, well developed, and well hydrated. In no apparent acute distress Mental status: Alert,  oriented x 3 (person, place, & time)       Respiratory: No evidence of acute respiratory distress Eyes: PERLA Vitals: BP (!) 140/75   Pulse 88   Temp 97.8 F (36.6 C) (Temporal)   Resp 18   Ht 5\' 4"  (1.626 m)   Wt 160 lb (72.6 kg)   SpO2 99%   BMI 27.46 kg/m  BMI: Estimated body mass index is 27.46 kg/m as calculated from the following:   Height as of this encounter: 5\' 4"  (1.626 m).   Weight as of this encounter: 160 lb (72.6 kg). Ideal: Ideal body weight: 54.7 kg (120 lb 9.5 oz) Adjusted ideal body weight: 61.9 kg (136 lb 5.7 oz)  Persistent right knee pain, arthropathic arthralgia  Assessment   Diagnosis Status  1. Primary osteoarthritis of right knee   2. 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 She has tried intra-articular steroid injections, genicular nerve block as well as genicular nerve RFA for her right knee without much benefit. Right knee MRI from December 2023 shows subchondral insufficiency fracture of the medial tibial plateau, radial tearing of the medial meniscus body and posterior horn and moderate medial and mild lateral compartment osteoarthritis. I will send her for evaluation with Dr. Signa Kell to see if she would be a surgical candidate. She states that her shoulders are doing much better after her suprascapular nerve blocks.  Future considerations could include pulsed radiofrequency ablation of the suprascapular nerves versus Sprint peripheral nerve stimulation  Pharmacotherapy (Medications Ordered): Meds ordered this  encounter  Medications   methocarbamol (ROBAXIN) injection 200 mg   meloxicam (MOBIC) 15 MG tablet    Sig: Take 1 tablet (15 mg total) by mouth daily.    Dispense:  30 tablet    Refill:  1   Orders:  Orders  Placed This Encounter  Procedures   Ambulatory referral to Orthopedics    Referral Priority:   Routine    Referral Type:   Consultation    Referred to Provider:   Signa Kell, MD    Number of Visits Requested:   1   Follow-up plan:   Return for patient will call to schedule F2F appt prn.      Bilateral genicular nerve block, left suprascapular nerve block, Bilateral GN RFA 10/07/22       Recent Visits Date Type Provider Dept  10/07/22 Procedure visit Edward Jolly, MD [facility]  09/08/22 Office Visit Edward Jolly, MD [facility]  Showing recent visits within past 90 days and meeting all other requirements Today's Visits Date Type Provider Dept  11/18/22 Office Visit Edward Jolly, MD [facility]  Showing today's visits and meeting all other requirements Future Appointments No visits were found meeting these conditions. Showing future appointments within next 90 days and meeting all other requirements  I discussed the assessment and treatment plan with the patient. The patient was provided an opportunity to ask questions and all were answered. The patient agreed with the plan and demonstrated an understanding of the instructions.  Patient advised to call back or seek an in-person evaluation if the symptoms or condition worsens.  Duration of encounter: .  Total time on encounter, as per AMA guidelines included both the face-to-face and non-face-to-face time personally spent by the physician and/or other qualified health care professional(s) on the day of the encounter (includes time in activities that require the physician or other qualified health care professional and does not include time in activities normally performed by clinical staff). Physician's time may include the following activities when performed: Preparing to see the patient (e.g., pre-charting review of records, searching for previously ordered imaging, lab work, and nerve  conduction tests) Review of prior analgesic pharmacotherapies. Reviewing PMP Interpreting ordered tests (e.g., lab work, imaging, nerve conduction tests) Performing post-procedure evaluations, including interpretation of diagnostic procedures Obtaining and/or reviewing separately obtained history Performing a medically appropriate examination and/or evaluation Counseling and educating the patient/family/caregiver Ordering medications, tests, or procedures Referring and communicating with other health care professionals (when not separately reported) Documenting clinical information in the electronic or other health record Independently interpreting results (not separately reported) and communicating results to the patient/ family/caregiver Care coordination (not separately reported)  Note by: Edward Jolly, MD Date: 11/18/2022; Time: 3:51 PM

## 2022-11-19 ENCOUNTER — Encounter: Payer: Self-pay | Admitting: Student in an Organized Health Care Education/Training Program

## 2022-11-19 DIAGNOSIS — M1711 Unilateral primary osteoarthritis, right knee: Secondary | ICD-10-CM

## 2022-11-19 MED ORDER — METHOCARBAMOL 500 MG PO TABS: 500.0000 mg | ORAL_TABLET | Freq: Three times a day (TID) | ORAL | 0 refills | Status: DC | PRN

## 2022-11-23 ENCOUNTER — Encounter: Payer: Self-pay | Admitting: Student in an Organized Health Care Education/Training Program

## 2022-11-24 ENCOUNTER — Telehealth: Payer: Self-pay | Admitting: *Deleted

## 2022-11-24 DIAGNOSIS — M25561 Pain in right knee: Secondary | ICD-10-CM | POA: Diagnosis not present

## 2022-11-24 NOTE — Telephone Encounter (Signed)
I have called patient and will discuss this matter with Dr. Cherylann Ratel and call patient back.

## 2022-11-24 NOTE — Telephone Encounter (Signed)
Spoke with Dr. Cherylann Ratel regarding the knee. He has offered for patient to come in for a nurse visit and if nurse assesses and feels as if physician should see her, he would be glad to see her. Called patient and offered this via VM as there was no answer when I called her back.

## 2022-11-25 DIAGNOSIS — M1711 Unilateral primary osteoarthritis, right knee: Secondary | ICD-10-CM | POA: Diagnosis not present

## 2022-11-25 DIAGNOSIS — M25561 Pain in right knee: Secondary | ICD-10-CM | POA: Diagnosis not present

## 2022-12-03 ENCOUNTER — Ambulatory Visit (INDEPENDENT_AMBULATORY_CARE_PROVIDER_SITE_OTHER): Payer: Medicare HMO | Admitting: Family Medicine

## 2022-12-03 ENCOUNTER — Encounter: Payer: Self-pay | Admitting: Family Medicine

## 2022-12-03 VITALS — BP 124/76 | HR 84 | Ht 64.0 in | Wt 162.0 lb

## 2022-12-03 DIAGNOSIS — Z01818 Encounter for other preprocedural examination: Secondary | ICD-10-CM | POA: Diagnosis not present

## 2022-12-03 DIAGNOSIS — Z7902 Long term (current) use of antithrombotics/antiplatelets: Secondary | ICD-10-CM

## 2022-12-03 DIAGNOSIS — Z5181 Encounter for therapeutic drug level monitoring: Secondary | ICD-10-CM | POA: Diagnosis not present

## 2022-12-03 NOTE — Progress Notes (Signed)
Date:  12/03/2022   Name:  Sharon Kaufman   DOB:  Oct 11, 1950   MRN:  [id number]   Chief Complaint: Pre-op Exam (R) knee replacement- 01/14/23 with Dr. Audelia Acton)  Patient is a 72 year old female who presents for a comprehensive physical exam. The patient reports the following problems: right knee pain. Health maintenance has been reviewed up      Lab Results  Component Value Date   NA 131 (L) 04/28/2022   K 5.5 (H) 04/28/2022   CO2 17 (L) 04/28/2022   GLUCOSE 138 (H) 04/28/2022   BUN 30 (H) 04/28/2022   CREATININE 0.99 04/28/2022   CALCIUM 9.3 04/28/2022   EGFR 62 04/28/2022   Lab Results  Component Value Date   CHOL 216 (H) 01/02/2022   HDL 80 01/02/2022   LDLCALC 123 (H) 01/02/2022   TRIG 76 01/02/2022   No results found for: "TSH" Lab Results  Component Value Date   HGBA1C 5.9 (H) 01/02/2022   Lab Results  Component Value Date   WBC 11.5 (H) 02/13/2022   HGB 13.3 02/13/2022   HCT 39.6 02/13/2022   MCV 91.2 02/13/2022   PLT 393 08/06/2022   Lab Results  Component Value Date   ALT 20 01/02/2022   AST 24 01/02/2022   ALKPHOS 87 01/02/2022   BILITOT <0.2 01/02/2022   Lab Results  Component Value Date   VD25OH 48.6 04/28/2022     Review of Systems  Constitutional:  Negative for activity change, diaphoresis and fever.  HENT:  Negative for congestion and postnasal drip.   Eyes:  Negative for visual disturbance.  Respiratory:  Negative for chest tightness, shortness of breath and wheezing.   Cardiovascular:  Negative for chest pain, palpitations and leg swelling.  Gastrointestinal:  Negative for abdominal distention, constipation and nausea.  Endocrine: Negative for polydipsia and polyuria.  Genitourinary:  Negative for difficulty urinating, hematuria and urgency.    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) 07/02/2022   Chronic painful diabetic neuropathy (HCC) 07/02/2022    Old tear of medial meniscus of right knee 07/02/2022   Abnormal echocardiogram 03/21/2022   Pes anserinus bursitis of right knee 03/17/2022   Stenosing tenosynovitis of finger of right hand 02/10/2022   Peroneal tendinitis, right 10/03/2021   Positive ANA (antinuclear antibody) 08/22/2021   Chronic musculoskeletal pain 08/05/2021   Osteoarthritis (arthritis due to wear and tear of joints) 07/29/2021   Osteoarthritis of right knee 07/29/2021   Plantar fasciitis, right 07/29/2021   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   Statin intolerance 03/21/2021   Acquired trigger finger of right middle finger 03/10/2021   Ankylosis of ankle joint, left 03/10/2021   Cirrhosis (HCC) 07/25/2020   Bronchiectasis without complication (HCC) 07/03/2020   Deviated septum 03/22/2020   Allergic rhinitis due to pollen 03/22/2020   Overweight (BMI 25.0-29.9) 12/06/2019   History of cardiovascular stress test 10/02/2019   Diastolic dysfunction without heart failure 10/02/2019   Dyspareunia in female 08/02/2019   Cataract, nuclear sclerotic senile, right 07/24/2019   Fibromyalgia 07/11/2019   History of hepatitis C 07/11/2019   Rheumatoid factor positive 06/06/2019   Other insomnia 06/06/2019   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   Essential hypertension 06/06/2019   Diabetes type 2, controlled (HCC) 06/06/2019   Anxiety 06/06/2019   Age-related osteoporosis without current pathological  fracture 01/03/2013   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/2012    Allergies  Allergen Reactions   Quetiapine Rash   Alendronate Other (See Comments)    dyspepsia Other reaction(s): Other dyspepsia dyspepsia Other reaction(s): Other dyspepsia  Other reaction(s): Other dyspepsia  dyspepsia   Atorvastatin Other (See Comments)    Other reaction(s): Other myalgia myalgia  Other reaction(s): Other myalgia myalgia  Other reaction(s):  Other myalgia  myalgia   Codeine Itching and Nausea Only   Pravastatin Other (See Comments)    myalgias   Risedronate Other (See Comments)    dyspepsia   Hydrocodone-Acetaminophen    Amitriptyline Rash    Past Surgical History:  Procedure Laterality Date   ANKLE FUSION Left 2009   APPENDECTOMY  2009   BREAST LUMPECTOMY Left 2008   REDUCTION MAMMAPLASTY Right 2014   ROTATOR CUFF REPAIR Left 2011   SPLENECTOMY  1973   TOTAL SHOULDER REPLACEMENT Right 2019    Social History   Tobacco Use   Smoking status: Former    Current packs/day: 0.00    Types: Cigarettes    Quit date: 2008    Years since quitting: 16.5   Smokeless tobacco: Never  Vaping Use   Vaping status: Never Used  Substance Use Topics   Alcohol use: Yes    Alcohol/week: 2.0 standard drinks of alcohol    Types: 2 Glasses of wine per week   Drug use: Never     Medication list has been reviewed and updated.  Current Meds  Medication Sig   Accu-Chek FastClix Lancets MISC 1 each by Other route daily.   ACCU-CHEK GUIDE test strip USE 1 DAILY.   acetaminophen (TYLENOL) 650 MG CR tablet Take 650 mg by mouth every 8 (eight) hours as needed for pain.   Ascorbic Acid (VITAMIN C) 1000 MG tablet Take 1,000 mg by mouth daily.   aspirin 81 MG EC tablet Take 81 mg by mouth daily.   Bioflavonoid Products (QUERCETIN COMPLEX IMMUNE PO) Take by mouth.   Biotin 10 MG CAPS Take 10 mg by mouth daily.   CALCIUM-VITAMIN D PO Take 1 tablet by mouth daily.   Carboxymethylcellul-Glycerin (REFRESH RELIEVA OP) Apply 1 drop to eye daily.   co-enzyme Q-10 30 MG capsule Take 30 mg by mouth 3 (three) times daily.   DULoxetine (CYMBALTA) 30 MG capsule Take 30 mg by mouth 2 (two) times daily.   Famotidine (PEPCID PO) Take by mouth.   lisinopril (ZESTRIL) 2.5 MG tablet Take 1 tablet (2.5 mg total) by mouth daily.   LORazepam (ATIVAN) 2 MG tablet Take 2 mg by mouth daily as needed.   LYSINE PO Take 1 tablet by mouth daily.   MAGNESIUM  PO Take by mouth at bedtime.   meloxicam (MOBIC) 15 MG tablet Take 1 tablet (15 mg total) by mouth daily.   metFORMIN (GLUCOPHAGE-XR) 750 MG 24 hr tablet TAKE 2 TABLETS EVERY DAY WITH BREAKFAST (Patient taking differently: 750 mg daily with breakfast.)   methocarbamol (ROBAXIN) 500 MG tablet Take 1 tablet (500 mg total) by mouth every 8 (eight) hours as needed for muscle spasms.   Semaglutide,0.25 or 0.5MG /DOS, 2 MG/3ML SOPN Inject into the skin.   traMADol (ULTRAM) 50 MG tablet Take by mouth.   vitamin E 1000 UNIT capsule Take 1,000 Units by mouth daily.   zinc gluconate 50 MG tablet Take 50 mg by mouth daily.   zolpidem (AMBIEN) 5 MG tablet Take 5 mg by mouth at bedtime. Psych  12/03/2022    3:55 PM 08/06/2022    1:25 PM 07/02/2022   10:01 AM 05/07/2022    3:20 PM  GAD 7 : Generalized Anxiety Score  Nervous, Anxious, on Edge 0 0 0 0  Control/stop worrying 0 0 0 0  Worry too much - different things 0 0 0 0  Trouble relaxing 0 0 0 0  Restless 0 0 0 0  Easily annoyed or irritable 0 0 0 0  Afraid - awful might happen 0 0 0 0  Total GAD 7 Score 0 0 0 0  Anxiety Difficulty Not difficult at all Not difficult at all Not difficult at all Not difficult at all       12/03/2022    3:55 PM 11/18/2022    2:02 PM 10/07/2022   10:46 AM  Depression screen PHQ 2/9  Decreased Interest 0 0 0  Down, Depressed, Hopeless 0 0 0  PHQ - 2 Score 0 0 0  Altered sleeping 0    Tired, decreased energy 0    Change in appetite 0    Feeling bad or failure about yourself  0    Trouble concentrating 0    Moving slowly or fidgety/restless 0    Suicidal thoughts 0    PHQ-9 Score 0    Difficult doing work/chores Not difficult at all      BP Readings from Last 3 Encounters:  12/03/22 124/76  11/18/22 (!) 140/75  10/20/22 (!) 145/79    Physical Exam Vitals and nursing note reviewed.  HENT:     Right Ear: Tympanic membrane, ear canal and external ear normal.     Left Ear: Tympanic membrane, ear  canal and external ear normal.     Nose: Nose normal.     Mouth/Throat:     Mouth: Mucous membranes are moist.  Cardiovascular:     Rate and Rhythm: Normal rate and regular rhythm.     Heart sounds: Normal heart sounds. No murmur heard.    No gallop.  Pulmonary:     Breath sounds: No wheezing, rhonchi or rales.  Abdominal:     Palpations: There is no hepatomegaly or splenomegaly.     Tenderness: There is no abdominal tenderness. There is no guarding.     Wt Readings from Last 3 Encounters:  12/03/22 162 lb (73.5 kg)  11/18/22 160 lb (72.6 kg)  10/20/22 160 lb 9.6 oz (72.8 kg)    BP 124/76   Pulse 84   Ht 5\' 4"  (1.626 m)   Wt 162 lb (73.5 kg)   SpO2 96%   BMI 27.81 kg/m   Assessment and Plan: 1. Preoperative clearance Patient is evaluated for preoperative clearance for upcoming total knee surgery.  Patient has no medical contraindications by history or examination and has full clearance for upcoming surgery.  2. Encounter for monitoring antiplatelet therapy Patient is taking 81 mg aspirin for primary prevention however she has no history of cerebrovascular or cardiovascular disease.  Therefore it is not recommended to take aspirin on a regular basis for primary prevention and patient has no secondary prevention concerns to warrant continuance or resumption of aspirin after surgery.    Elizabeth Sauer, MD

## 2022-12-22 ENCOUNTER — Ambulatory Visit
Admission: RE | Admit: 2022-12-22 | Discharge: 2022-12-22 | Disposition: A | Discharge status: Home / Self Care | Payer: Medicare HMO | Source: Ambulatory Visit | Attending: Internal Medicine | Admitting: Internal Medicine

## 2022-12-22 ENCOUNTER — Other Ambulatory Visit: Payer: Self-pay | Admitting: Family Medicine

## 2022-12-22 DIAGNOSIS — R222 Localized swelling, mass and lump, trunk: Secondary | ICD-10-CM | POA: Diagnosis not present

## 2022-12-22 DIAGNOSIS — Z1231 Encounter for screening mammogram for malignant neoplasm of breast: Secondary | ICD-10-CM

## 2022-12-22 DIAGNOSIS — E785 Hyperlipidemia, unspecified: Secondary | ICD-10-CM | POA: Diagnosis not present

## 2022-12-22 DIAGNOSIS — E1159 Type 2 diabetes mellitus with other circulatory complications: Secondary | ICD-10-CM | POA: Diagnosis not present

## 2022-12-22 DIAGNOSIS — R9389 Abnormal findings on diagnostic imaging of other specified body structures: Secondary | ICD-10-CM | POA: Diagnosis not present

## 2022-12-22 DIAGNOSIS — E119 Type 2 diabetes mellitus without complications: Secondary | ICD-10-CM | POA: Diagnosis not present

## 2022-12-22 DIAGNOSIS — E1169 Type 2 diabetes mellitus with other specified complication: Secondary | ICD-10-CM | POA: Diagnosis not present

## 2022-12-22 DIAGNOSIS — I152 Hypertension secondary to endocrine disorders: Secondary | ICD-10-CM | POA: Diagnosis not present

## 2022-12-22 DIAGNOSIS — M8580 Other specified disorders of bone density and structure, unspecified site: Secondary | ICD-10-CM | POA: Diagnosis not present

## 2022-12-22 DIAGNOSIS — J479 Bronchiectasis, uncomplicated: Secondary | ICD-10-CM | POA: Diagnosis not present

## 2022-12-25 ENCOUNTER — Ambulatory Visit: Payer: Medicare HMO | Admitting: Adult Health

## 2022-12-25 ENCOUNTER — Encounter: Payer: Self-pay | Admitting: Adult Health

## 2022-12-25 VITALS — BP 128/80 | HR 75 | Temp 97.1°F | Ht 64.0 in | Wt 164.8 lb

## 2022-12-25 DIAGNOSIS — R911 Solitary pulmonary nodule: Secondary | ICD-10-CM

## 2022-12-25 NOTE — Patient Instructions (Signed)
I will call with CT chest results when available  Activity as tolerated.  Follow up with Dr. Belia Heman in 6 months and As needed

## 2022-12-25 NOTE — Progress Notes (Unsigned)
@Patient  ID: Sharon Kaufman, female    DOB: [birth year], 72 y.o.   MRN: [id number]  Chief Complaint  Patient presents with   Follow-up    Referring provider: Duanne Limerick, MD  HPI: 72 year old female former smoker seen for consult August 11, 2022 for abnormal CT chest with right lower lobe lung nodule  TEST/EVENTS :  CT chest June 26, 2022 solid pulmonary nodule right lower lobe 1.5 cm. CT CHEST 08/2022 showed 1.5 cm nodular opacification in the right lower lobe which has not significantly changed  PET scan May 2024-Very mild uptake in the right lower lobe opacity SUV 2.9 postinfectious inflammatory is most likely cause  12/25/2022 Follow up: Lung nodule  Patient presents for 48-month follow-up.  Patient was seen in March for a pulmonary consult.  Patient was undergoing a workup for hepatic steatosis.  MRI liver showed incidental finding of right lower lobe nodularity.  She was set up for  a CT chest June 26, 2022 showed a solid pulmonary nodule measuring 1.5 cm.  Patient was set up for a follow-up CT in April that showed no significant change in the 1.5 nodular opacification in the right lower lobe.  Subsequent PET scan in May showed very mild uptake in the right lower lobe with SUV at 2.9.  Patient was recommended to have a serial follow-up which was completed on December 22, 2022.  Results are pending.  Patient denies any hemoptysis, unintentional weight loss.  She has no underlying known history of COPD, emphysema or asthma, denies any cough wheezing or shortness of breath. No hemoptysis.       Allergies  Allergen Reactions   Quetiapine Rash   Alendronate Other (See Comments)    dyspepsia   Atorvastatin Other (See Comments)    myalgia    Codeine Itching and Nausea Only   Pravastatin Other (See Comments)    myalgias   Risedronate Other (See Comments)    dyspepsia   Hydrocodone-Acetaminophen    Amitriptyline Rash    Immunization History  Administered Date(s)  Administered   Pneumococcal Conjugate-13 09/02/2017   Pneumococcal Polysaccharide-23 12/07/2018   Td 12/28/2019   Tdap 04/25/2018   Zoster, Live 10/17/2014    Past Medical History:  Diagnosis Date   Allergy    Anxiety    Breast cancer (HCC)    Breast cancer, left (HCC) 2008   Diabetes mellitus without complication (HCC)    GERD (gastroesophageal reflux disease)    Hyperlipidemia    Hypertension    Personal history of radiation therapy 2008   Left Breast Cancer    Tobacco History: Social History   Tobacco Use  Smoking Status Former   Current packs/day: 0.00   Types: Cigarettes   Quit date: 2008   Years since quitting: 16.5  Smokeless Tobacco Never   Counseling given: Not Answered   Outpatient Medications Prior to Visit  Medication Sig Dispense Refill   Accu-Chek FastClix Lancets MISC 1 each by Other route daily.     ACCU-CHEK GUIDE test strip USE 1 DAILY. 100 strip 1   acetaminophen (TYLENOL) 650 MG CR tablet Take 650 mg by mouth every 8 (eight) hours as needed for pain.     Ascorbic Acid (VITAMIN C) 1000 MG tablet Take 1,000 mg by mouth daily.     Bioflavonoid Products (QUERCETIN COMPLEX IMMUNE PO) Take by mouth.     Biotin 10 MG CAPS Take 10 mg by mouth daily.     CALCIUM-VITAMIN D PO Take 1 tablet  by mouth daily.     Carboxymethylcellul-Glycerin (REFRESH RELIEVA OP) Apply 1 drop to eye daily.     co-enzyme Q-10 30 MG capsule Take 30 mg by mouth 3 (three) times daily.     Famotidine (PEPCID PO) Take by mouth.     lisinopril (ZESTRIL) 2.5 MG tablet Take 1 tablet (2.5 mg total) by mouth daily. 100 tablet 0   LORazepam (ATIVAN) 2 MG tablet Take 2 mg by mouth daily as needed.     LYSINE PO Take 1 tablet by mouth daily.     MAGNESIUM PO Take by mouth at bedtime.     meloxicam (MOBIC) 15 MG tablet Take 1 tablet (15 mg total) by mouth daily. 30 tablet 1   metFORMIN (GLUCOPHAGE-XR) 750 MG 24 hr tablet TAKE 2 TABLETS EVERY DAY WITH BREAKFAST (Patient taking differently:  750 mg daily with breakfast.) 180 tablet 0   methocarbamol (ROBAXIN) 500 MG tablet Take 1 tablet (500 mg total) by mouth every 8 (eight) hours as needed for muscle spasms. 90 tablet 0   Semaglutide,0.25 or 0.5MG /DOS, 2 MG/3ML SOPN Inject into the skin.     traMADol (ULTRAM) 50 MG tablet Take by mouth.     vitamin E 1000 UNIT capsule Take 1,000 Units by mouth daily.     zinc gluconate 50 MG tablet Take 50 mg by mouth daily.     zolpidem (AMBIEN) 5 MG tablet Take 5 mg by mouth at bedtime. Psych     aspirin 81 MG EC tablet Take 81 mg by mouth daily.     DULoxetine (CYMBALTA) 30 MG capsule Take 30 mg by mouth 2 (two) times daily.     No facility-administered medications prior to visit.     Review of Systems:   Constitutional:   No  weight loss, night sweats,  Fevers, chills, fatigue, or  lassitude.  HEENT:   No headaches,  Difficulty swallowing,  Tooth/dental problems, or  Sore throat,                No sneezing, itching, ear ache, nasal congestion, post nasal drip,   CV:  No chest pain,  Orthopnea, PND, swelling in lower extremities, anasarca, dizziness, palpitations, syncope.   GI  No heartburn, indigestion, abdominal pain, nausea, vomiting, diarrhea, change in bowel habits, loss of appetite, bloody stools.   Resp: No shortness of breath with exertion or at rest.  No excess mucus, no productive cough,  No non-productive cough,  No coughing up of blood.  No change in color of mucus.  No wheezing.  No chest wall deformity  Skin: no rash or lesions.  GU: no dysuria, change in color of urine, no urgency or frequency.  No flank pain, no hematuria   MS:  No joint pain or swelling.  No decreased range of motion.  No back pain.    Physical Exam  BP 128/80 (BP Location: Left Arm, Cuff Size: Normal)   Pulse 75   Temp (!) 97.1 F (36.2 C)   Ht 5\' 4"  (1.626 m)   Wt 164 lb 12.8 oz (74.8 kg)   SpO2 94%   BMI 28.29 kg/m   GEN: A/Ox3; pleasant , NAD, well nourished    HEENT:  [place]/AT,   EACs-clear, TMs-wnl, NOSE-clear, THROAT-clear, no lesions, no postnasal drip or exudate noted.   NECK:  Supple w/ fair ROM; no JVD; normal carotid impulses w/o bruits; no thyromegaly or nodules palpated; no lymphadenopathy.    RESP  Clear  P & A;  w/o, wheezes/ rales/ or rhonchi. no accessory muscle use, no dullness to percussion  CARD:  RRR, no m/r/g, no peripheral edema, pulses intact, no cyanosis or clubbing.  GI:   Soft & nt; nml bowel sounds; no organomegaly or masses detected.   Musco: Warm bil, no deformities or joint swelling noted.   Neuro: alert, no focal deficits noted.    Skin: Warm, no lesions or rashes    Lab Results:   BMET   BNP No results found for: "BNP"  ProBNP No results found for: "PROBNP"  Imaging: No results found.  methocarbamol (ROBAXIN) injection 200 mg     Date Action Dose Route User   11/18/2022 1424 Given 200 mg Intramuscular (Right Ventrogluteal) Vernie Ammons, RN           No data to display          No results found for: "NITRICOXIDE"      Assessment & Plan:   No problem-specific Assessment & Plan notes found for this encounter.     Rubye Oaks, NP 12/25/2022

## 2022-12-25 NOTE — Assessment & Plan Note (Signed)
1.5 cm right lower lobe lung nodule initially found on CT scan June 26, 2022.  Serial follow-up on CT scan April 2024 showed stable lung nodule.  CT chest December 22, 2022 results are pending, once available will decide on next step.  Patient is aware that I will call her with results when available

## 2022-12-29 ENCOUNTER — Other Ambulatory Visit: Payer: Self-pay | Admitting: Orthopedic Surgery

## 2022-12-29 ENCOUNTER — Telehealth: Payer: Self-pay | Admitting: Adult Health

## 2022-12-29 DIAGNOSIS — R911 Solitary pulmonary nodule: Secondary | ICD-10-CM

## 2022-12-29 NOTE — Telephone Encounter (Signed)
Discussed CT chest results from 12/22/22 , stable nodules  Will repeat CT chest in 6 months . Discussed with Dr Belia Heman Will call for OV after CT to discuss results.  Patient aware

## 2023-01-05 ENCOUNTER — Encounter
Admission: RE | Admit: 2023-01-05 | Discharge: 2023-01-05 | Disposition: A | Discharge status: Home / Self Care | Payer: Medicare HMO | Source: Ambulatory Visit | Attending: Orthopedic Surgery | Admitting: Orthopedic Surgery

## 2023-01-05 ENCOUNTER — Other Ambulatory Visit: Payer: Self-pay

## 2023-01-05 VITALS — BP 135/85 | HR 78 | Resp 16 | Ht 64.0 in | Wt 160.0 lb

## 2023-01-05 DIAGNOSIS — Z01818 Encounter for other preprocedural examination: Secondary | ICD-10-CM | POA: Diagnosis not present

## 2023-01-05 DIAGNOSIS — Z01810 Encounter for preprocedural cardiovascular examination: Secondary | ICD-10-CM | POA: Diagnosis not present

## 2023-01-05 DIAGNOSIS — E119 Type 2 diabetes mellitus without complications: Secondary | ICD-10-CM

## 2023-01-05 HISTORY — DX: Fibromyalgia: M79.7

## 2023-01-05 HISTORY — DX: Personal history of other infectious and parasitic diseases: Z86.19

## 2023-01-05 HISTORY — DX: Unspecified osteoarthritis, unspecified site: M19.90

## 2023-01-05 HISTORY — DX: Nonalcoholic steatohepatitis (NASH): K75.81

## 2023-01-05 LAB — CBC WITH DIFFERENTIAL/PLATELET
Abs Immature Granulocytes: 0.02 10*3/uL (ref 0.00–0.07)
Basophils Absolute: 0 10*3/uL (ref 0.0–0.1)
Basophils Relative: 1 %
Eosinophils Absolute: 0.1 10*3/uL (ref 0.0–0.5)
Eosinophils Relative: 1 %
HCT: 39.5 % (ref 36.0–46.0)
Hemoglobin: 13.4 g/dL (ref 12.0–15.0)
Immature Granulocytes: 0 %
Lymphocytes Relative: 39 %
Lymphs Abs: 3.1 10*3/uL (ref 0.7–4.0)
MCH: 30.1 pg (ref 26.0–34.0)
MCHC: 33.9 g/dL (ref 30.0–36.0)
MCV: 88.8 fL (ref 80.0–100.0)
Monocytes Absolute: 0.9 10*3/uL (ref 0.1–1.0)
Monocytes Relative: 11 %
Neutro Abs: 3.9 10*3/uL (ref 1.7–7.7)
Neutrophils Relative %: 48 %
Platelets: 378 10*3/uL (ref 150–400)
RBC: 4.45 MIL/uL (ref 3.87–5.11)
RDW: 14.9 % (ref 11.5–15.5)
WBC: 7.9 10*3/uL (ref 4.0–10.5)
nRBC: 0 % (ref 0.0–0.2)

## 2023-01-05 LAB — COMPREHENSIVE METABOLIC PANEL
ALT: 21 U/L (ref 0–44)
AST: 24 U/L (ref 15–41)
Albumin: 3.9 g/dL (ref 3.5–5.0)
Alkaline Phosphatase: 47 U/L (ref 38–126)
Anion gap: 8 (ref 5–15)
BUN: 15 mg/dL (ref 8–23)
CO2: 24 mmol/L (ref 22–32)
Calcium: 9.5 mg/dL (ref 8.9–10.3)
Chloride: 102 mmol/L (ref 98–111)
Creatinine, Ser: 0.73 mg/dL (ref 0.44–1.00)
GFR, Estimated: >60 mL/min (ref >60)
Glucose, Bld: 123 mg/dL — ABNORMAL HIGH (ref 70–99)
Potassium: 3.9 mmol/L (ref 3.5–5.1)
Sodium: 134 mmol/L — ABNORMAL LOW (ref 135–145)
Total Bilirubin: 0.5 mg/dL (ref 0.3–1.2)
Total Protein: 7.8 g/dL (ref 6.5–8.1)

## 2023-01-05 LAB — URINALYSIS, ROUTINE W REFLEX MICROSCOPIC
Bilirubin Urine: NEGATIVE
Glucose, UA: NEGATIVE mg/dL
Hgb urine dipstick: NEGATIVE
Ketones, ur: NEGATIVE mg/dL
Leukocytes,Ua: NEGATIVE
Nitrite: NEGATIVE
Protein, ur: 30 mg/dL — AB
Specific Gravity, Urine: 1.017 (ref 1.005–1.030)
pH: 5 (ref 5.0–8.0)

## 2023-01-05 LAB — TYPE AND SCREEN
ABO/RH(D): A POS
Antibody Screen: NEGATIVE

## 2023-01-05 LAB — SURGICAL PCR SCREEN
MRSA, PCR: NEGATIVE
Staphylococcus aureus: NEGATIVE

## 2023-01-05 NOTE — Patient Instructions (Addendum)
Your procedure is scheduled on: Thursday 01/14/23 To find out your arrival time, please call [phone] between 1PM - 3PM on:   Wednesday 01/13/23 Report to the Registration Desk on the 1st floor of [facility]. Free Valet parking is available.  If your arrival time is 6:00 am, do not arrive before that time as the [facility] entrance doors do not open until 6:00 am.  REMEMBER: Instructions that are not followed completely may result in serious medical risk, up to and including death; or upon the discretion of your surgeon and anesthesiologist your surgery may need to be rescheduled.  Do not eat food after midnight the night before surgery.  No gum chewing or hard candies.  You may however, drink CLEAR liquids up to 2 hours before you are scheduled to arrive for your surgery. Do not drink anything within 2 hours of your scheduled arrival time.  Type 1 and Type 2 diabetics should only drink water.  In addition, your doctor has ordered for you to drink the provided:   Gatorade G2 Drinking this  up to two hours before surgery helps to reduce insulin resistance and improve patient outcomes. Please complete drinking 2 hours before scheduled arrival time.  One week prior to surgery: Stop Anti-inflammatories (NSAIDS) such as Advil, Aleve, Ibuprofen, Motrin, Naproxen, Naprosyn and Aspirin based products such as Excedrin, Goody's Powder, BC Powder. You will need to stop the Meloxicam and the Ibuprofen for 7 days You may however, continue to take Tylenol if needed for pain up until the day of surgery.  Stop ANY OVER THE COUNTER supplements and vitamins for 7 days until after surgery.  Continue taking all prescribed medications with the exception of the following:  Ozempic hold for at least 7 days, last dose Saturday 01/02/23 Metformin hold for 2 days, last dose Monday 01/11/23  TAKE ONLY THESE MEDICATIONS THE MORNING OF SURGERY WITH A SIP OF WATER:  Famotidine (PEPCID PO) Antacid (take  one the night before and one on the morning of surgery - helps to prevent nausea after surgery.)  No Alcohol for 24 hours before or after surgery.  No Smoking including e-cigarettes for 24 hours before surgery.  No chewable tobacco products for at least 6 hours before surgery.  No nicotine patches on the day of surgery.  Do not use any "recreational" drugs for at least a week (preferably 2 weeks) before your surgery.  Please be advised that the combination of cocaine and anesthesia may have negative outcomes, up to and including death. If you test positive for cocaine, your surgery will be cancelled.  On the morning of surgery brush your teeth with toothpaste and water, you may rinse your mouth with mouthwash if you wish. Do not swallow any toothpaste or mouthwash.  Use CHG Soap or wipes as directed on instruction sheet. Shower daily with CHG soap starting on Sunday 01/10/23   Do not wear lotions, powders, or perfumes. (You may use any of the lotions listed below  the first 4 days only)  Do not shave body hair from the neck down 48 hours before surgery.  Wear comfortable clothing (specific to your surgery type) to the hospital.  Do not wear jewelry, make-up, hairpins, clips or nail polish.  Contact lenses, hearing aids and dentures may not be worn into surgery.  Do not bring valuables to the hospital. [facility] is not responsible for any missing/lost belongings or valuables.   Notify your doctor if there is any change in your  medical condition (cold, fever, infection).  If you are being discharged the day of surgery, you will not be allowed to drive home. You will need a responsible individual to drive you home and stay with you for 24 hours after surgery.   If you are taking public transportation, you will need to have a responsible individual with you.  If you are being admitted to the hospital overnight, leave your suitcase in the car. After surgery it may be brought to your  room.  In case of increased patient census, it may be necessary for you, the patient, to continue your postoperative care in the Same Day Surgery department.  After surgery, you can help prevent lung complications by doing breathing exercises.  Take deep breaths and cough every 1-2 hours. Your doctor may order a device called an Incentive Spirometer to help you take deep breaths. When coughing or sneezing, hold a pillow firmly against your incision with both hands. This is called "splinting." Doing this helps protect your incision. It also decreases belly discomfort.  Surgery Visitation Policy:  Patients undergoing a surgery or procedure may have two family members or support persons with them as long as the person is not COVID-19 positive or experiencing its symptoms.   Inpatient Visitation:    Visiting hours are 7 a.m. to 8 p.m. Up to four visitors are allowed at one time in a patient room. The visitors may rotate out with other people during the day. One designated support person (adult) may remain overnight.  Please call the Pre-admissions Testing Dept. at [phone] if you have any questions about these instructions.    Pre-operative 5 CHG Bath Instructions   You can play a key role in reducing the risk of infection after surgery. Your skin needs to be as free of germs as possible. You can reduce the number of germs on your skin by washing with CHG (chlorhexidine gluconate) soap before surgery. CHG is an antiseptic soap that kills germs and continues to kill germs even after washing.   DO NOT use if you have an allergy to chlorhexidine/CHG or antibacterial soaps. If your skin becomes reddened or irritated, stop using the CHG and notify one of our RNs at [phone].   Please shower with the CHG soap starting 4 days before surgery using the following schedule:     Please keep in mind the following:  DO NOT shave, including legs and underarms, starting the day of your first  shower.   You may shave your face at any point before/day of surgery.  Place clean sheets on your bed the day you start using CHG soap. Use a clean washcloth (not used since being washed) for each shower. DO NOT sleep with pets once you start using the CHG.   CHG Shower Instructions:  If you choose to wash your hair and private area, wash first with your normal shampoo/soap.  After you use shampoo/soap, rinse your hair and body thoroughly to remove shampoo/soap residue.  Turn the water OFF and apply about 3 tablespoons (45 ml) of CHG soap to a CLEAN washcloth.  Apply CHG soap ONLY FROM YOUR NECK DOWN TO YOUR TOES (washing for 3-5 minutes)  DO NOT use CHG soap on face, private areas, open wounds, or sores.  Pay special attention to the area where your surgery is being performed.  If you are having back surgery, having someone wash your back for you may be helpful. Wait 2 minutes after CHG soap is applied, then  you may rinse off the CHG soap.  Pat dry with a clean towel  Put on clean clothes/pajamas   If you choose to wear lotion, please use ONLY the CHG-compatible lotions on the back of this paper.     Additional instructions for the day of surgery: DO NOT APPLY any lotions, deodorants, cologne, or perfumes.   Put on clean/comfortable clothes.  Brush your teeth.  Ask your nurse before applying any prescription medications to the skin.      CHG Compatible Lotions   Aveeno Moisturizing lotion  Cetaphil Moisturizing Cream  Cetaphil Moisturizing Lotion  Clairol Herbal Essence Moisturizing Lotion, Dry Skin  Clairol Herbal Essence Moisturizing Lotion, Extra Dry Skin  Clairol Herbal Essence Moisturizing Lotion, Normal Skin  Curel Age Defying Therapeutic Moisturizing Lotion with Alpha Hydroxy  Curel Extreme Care Body Lotion  Curel Soothing Hands Moisturizing Hand Lotion  Curel Therapeutic Moisturizing Cream, Fragrance-Free  Curel Therapeutic Moisturizing Lotion, Fragrance-Free  Curel  Therapeutic Moisturizing Lotion, Original Formula  Eucerin Daily Replenishing Lotion  Eucerin Dry Skin Therapy Plus Alpha Hydroxy Crme  Eucerin Dry Skin Therapy Plus Alpha Hydroxy Lotion  Eucerin Original Crme  Eucerin Original Lotion  Eucerin Plus Crme Eucerin Plus Lotion  Eucerin TriLipid Replenishing Lotion  Keri Anti-Bacterial Hand Lotion  Keri Deep Conditioning Original Lotion Dry Skin Formula Softly Scented  Keri Deep Conditioning Original Lotion, Fragrance Free Sensitive Skin Formula  Keri Lotion Fast Absorbing Fragrance Free Sensitive Skin Formula  Keri Lotion Fast Absorbing Softly Scented Dry Skin Formula  Keri Original Lotion  Keri Skin Renewal Lotion Keri Silky Smooth Lotion  Keri Silky Smooth Sensitive Skin Lotion  Nivea Body Creamy Conditioning Oil  Nivea Body Extra Enriched Lotion  Nivea Body Original Lotion  Nivea Body Sheer Moisturizing Lotion Nivea Crme  Nivea Skin Firming Lotion  NutraDerm 30 Skin Lotion  NutraDerm Skin Lotion  NutraDerm Therapeutic Skin Cream  NutraDerm Therapeutic Skin Lotion  ProShield Protective Hand Cream  Provon moisturizing lotion  How to Use an Incentive Spirometer  An incentive spirometer is a tool that measures how well you are filling your lungs with each breath. Learning to take long, deep breaths using this tool can help you keep your lungs clear and active. This may help to reverse or lessen your chance of developing breathing (pulmonary) problems, especially infection. You may be asked to use a spirometer: After a surgery. If you have a lung problem or a history of smoking. After a long period of time when you have been unable to move or be active. If the spirometer includes an indicator to show the highest number that you have reached, your health care provider or respiratory therapist will help you set a goal. Keep a log of your progress as told by your health care provider. What are the risks? Breathing too quickly may cause  dizziness or cause you to pass out. Take your time so you do not get dizzy or light-headed. If you are in pain, you may need to take pain medicine before doing incentive spirometry. It is harder to take a deep breath if you are having pain. How to use your incentive spirometer  Sit up on the edge of your bed or on a chair. Hold the incentive spirometer so that it is in an upright position. Before you use the spirometer, breathe out normally. Place the mouthpiece in your mouth. Make sure your lips are closed tightly around it. Breathe in slowly and as deeply as you can through your mouth,  causing the piston or the ball to rise toward the top of the chamber. Hold your breath for 3-5 seconds, or for as long as possible. If the spirometer includes a coach indicator, use this to guide you in breathing. Slow down your breathing if the indicator goes above the marked areas. Remove the mouthpiece from your mouth and breathe out normally. The piston or ball will return to the bottom of the chamber. Rest for a few seconds, then repeat the steps 10 or more times. Take your time and take a few normal breaths between deep breaths so that you do not get dizzy or light-headed. Do this every 1-2 hours when you are awake. If the spirometer includes a goal marker to show the highest number you have reached (best effort), use this as a goal to work toward during each repetition. After each set of 10 deep breaths, cough a few times. This will help to make sure that your lungs are clear. If you have an incision on your chest or abdomen from surgery, place a pillow or a rolled-up towel firmly against the incision when you cough. This can help to reduce pain while taking deep breaths and coughing. General tips When you are able to get out of bed: Walk around often. Continue to take deep breaths and cough in order to clear your lungs. Keep using the incentive spirometer until your health care provider says it is okay  to stop using it. If you have been in the hospital, you may be told to keep using the spirometer at home. Contact a health care provider if: You are having difficulty using the spirometer. You have trouble using the spirometer as often as instructed. Your pain medicine is not giving enough relief for you to use the spirometer as told. You have a fever. Get help right away if: You develop shortness of breath. You develop a cough with bloody mucus from the lungs. You have fluid or blood coming from an incision site after you cough. Summary An incentive spirometer is a tool that can help you learn to take long, deep breaths to keep your lungs clear and active. You may be asked to use a spirometer after a surgery, if you have a lung problem or a history of smoking, or if you have been inactive for a long period of time. Use your incentive spirometer as instructed every 1-2 hours while you are awake. If you have an incision on your chest or abdomen, place a pillow or a rolled-up towel firmly against your incision when you cough. This will help to reduce pain. Get help right away if you have shortness of breath, you cough up bloody mucus, or blood comes from your incision when you cough. This information is not intended to replace advice given to you by your health care provider. Make sure you discuss any questions you have with your health care provider. Document Revised: 07/31/2019 Document Reviewed: 07/31/2019 Elsevier Patient Education  2023 [employer]    Preoperative Educational Videos for Total Hip, Knee and Shoulder Replacements  To better prepare for surgery, please view our videos that explain the physical activity and discharge planning required to have the best surgical recovery at [facility].  [email or web address]  Questions? Call [phone] or email [email or web address]

## 2023-01-05 NOTE — Progress Notes (Signed)
  Perioperative Services Pre-Admission/Anesthesia Testing    Date: 01/05/23  Name: Sharon Kaufman MRN:   [id number]  Re: GLP-1 clearance and provider recommendations   Planned Surgical Procedure(s):    Case: [id number] Date/Time: 01/14/23 1015   Procedure: TOTAL KNEE ARTHROPLASTY (Right: Knee)   Anesthesia type: Choice   Pre-op diagnosis: Primary osteoarthritis of right knee M17.11   Location: [facility] OR ROOM 01 / [facility]   Surgeons: Reinaldo Berber, MD      Clinical Notes:  Patient is scheduled for the above procedure with the indicated provider/surgeon. In review of her medication reconciliation it was noted that patient is on a prescribed GLP-1 medication. Per guidelines issued by the American Society of Anesthesiologists (ASA), it is recommended that these medications be held for 7 days prior to the patient undergoing any type of elective surgical procedure. The patient is taking the following GLP-1 medication:  [x]  SEMAGLUTIDE   []  EXENATIDE  []  LIRAGLUTIDE   []  LIXISENATIDE  []  DULAGLUTIDE     []  TIRZEPATIDE (GLP-1/GIP)  Reached out to prescribing provider Huntley Dec, PA-C) to make them aware of the guidelines from anesthesia. Given that this patient takes the prescribed GLP-1 medication for her  diabetes diagnosis, rather than for weight loss, recommendations from the prescribing provider were solicited. Prescribing provider made aware of the following so that informed decision/POC can be developed for this patient that may be taking medications belonging to these drug classes:  Oral GLP-1 medications will be held 1 day prior to surgery.  Injectable GLP-1 medications will be held 7 days prior to surgery.  Metformin is routinely held 48 hours prior to surgery due to renal concerns, potential need for contrasted imaging perioperatively, and the potential for tissue hypoxia leading to drug induced lactic acidosis.  All SGLT2i medications are held  72 hours prior to surgery as they can be associated with the increased potential for developing euglycemic diabetic ketoacidosis (EDKA).   Impression and Plan:  Sharon Kaufman is on a prescribed GLP-1 medication, which induces the known side effect of decreased gastric emptying. Efforts are bring made to mitigate the risk of perioperative hyperglycemic events, as elevated blood glucose levels have been found to contribute to intra/postoperative complications. Additionally, hyperglycemic extremes can potentially necessitate the postponing of a patient's elective case in order to better optimize perioperative glycemic control, again with the aforementioned guidelines in place. With this in mind, recommendations have been sought from the prescribing provider, who has cleared patient to proceed with holding the prescribed GLP-1 as per the guidelines from the ASA.   Provider recommending: no further recommendations received from the prescribing provider.  Copy of signed clearance and recommendations placed on patient's chart for inclusion in their medical record and for review by the surgical/anesthetic team on the day of her procedure.   Quentin Mulling, MSN, APRN, FNP-C, CEN [facility]  Peri-operative Services Nurse Practitioner Phone: [phone] 01/05/23 3:54 PM  NOTE: This note has been prepared using Dragon dictation software. Despite my best ability to proofread, there is always the potential that unintentional transcriptional errors may still occur from this process.

## 2023-01-13 ENCOUNTER — Telehealth: Payer: Self-pay | Admitting: Family Medicine

## 2023-01-13 NOTE — Telephone Encounter (Signed)
Copied from CRM [id number]. Topic: Medicare AWV >> Jan 13, 2023  2:35 PM Payton Doughty wrote: Reason for CRM: LM 01/13/2023 to schedule AWV   Verlee Rossetti; Care Guide Ambulatory Clinical Support [facility] Direct Dial: [phone]

## 2023-01-14 ENCOUNTER — Ambulatory Visit: Payer: Medicare HMO

## 2023-01-14 ENCOUNTER — Encounter: Payer: Self-pay | Admitting: Orthopedic Surgery

## 2023-01-14 ENCOUNTER — Ambulatory Visit: Payer: Medicare HMO | Admitting: Urgent Care

## 2023-01-14 ENCOUNTER — Other Ambulatory Visit: Payer: Self-pay

## 2023-01-14 ENCOUNTER — Encounter
Admission: RE | Disposition: A | Discharge status: Home Health | Payer: Self-pay | Source: Home / Self Care | Attending: Orthopedic Surgery

## 2023-01-14 ENCOUNTER — Observation Stay
Admission: RE | Admit: 2023-01-14 | Discharge: 2023-01-15 | Disposition: A | Discharge status: Home Health | Payer: Medicare HMO | Attending: Orthopedic Surgery | Admitting: Orthopedic Surgery

## 2023-01-14 DIAGNOSIS — Z96651 Presence of right artificial knee joint: Secondary | ICD-10-CM

## 2023-01-14 DIAGNOSIS — Z79899 Other long term (current) drug therapy: Secondary | ICD-10-CM | POA: Diagnosis not present

## 2023-01-14 DIAGNOSIS — M1711 Unilateral primary osteoarthritis, right knee: Secondary | ICD-10-CM | POA: Diagnosis present

## 2023-01-14 DIAGNOSIS — E119 Type 2 diabetes mellitus without complications: Secondary | ICD-10-CM | POA: Diagnosis not present

## 2023-01-14 DIAGNOSIS — Z01818 Encounter for other preprocedural examination: Principal | ICD-10-CM

## 2023-01-14 DIAGNOSIS — I10 Essential (primary) hypertension: Secondary | ICD-10-CM | POA: Insufficient documentation

## 2023-01-14 DIAGNOSIS — Z853 Personal history of malignant neoplasm of breast: Secondary | ICD-10-CM | POA: Diagnosis not present

## 2023-01-14 DIAGNOSIS — Z7984 Long term (current) use of oral hypoglycemic drugs: Secondary | ICD-10-CM | POA: Insufficient documentation

## 2023-01-14 HISTORY — PX: TOTAL KNEE ARTHROPLASTY: SHX125

## 2023-01-14 LAB — GLUCOSE, CAPILLARY
Glucose-Capillary: 141 mg/dL — ABNORMAL HIGH (ref 70–99)
Glucose-Capillary: 117 mg/dL — ABNORMAL HIGH (ref 70–99)
Glucose-Capillary: 256 mg/dL — ABNORMAL HIGH (ref 70–99)

## 2023-01-14 LAB — ABO/RH: ABO/RH(D): A POS

## 2023-01-14 SURGERY — ARTHROPLASTY, KNEE, TOTAL
Anesthesia: Spinal | Site: Knee | Laterality: Right

## 2023-01-14 MED ORDER — METHOCARBAMOL 500 MG PO TABS
500.0000 mg | ORAL_TABLET | Freq: Three times a day (TID) | ORAL | Status: DC | PRN
  Administered 2023-01-14: 500 mg via ORAL

## 2023-01-14 MED ORDER — PHENYLEPHRINE HCL-NACL 20-0.9 MG/250ML-% IV SOLN
INTRAVENOUS | Status: AC
  Filled 2023-01-14: qty 250

## 2023-01-14 MED ORDER — INSULIN ASPART 100 UNIT/ML IJ SOLN: 0.0000 [IU] | Freq: Three times a day (TID) | INTRAMUSCULAR | Status: DC

## 2023-01-14 MED ORDER — ZOLPIDEM TARTRATE 5 MG PO TABS
5.0000 mg | ORAL_TABLET | Freq: Every evening | ORAL | Status: DC | PRN
  Administered 2023-01-14: 5 mg via ORAL

## 2023-01-14 MED ORDER — OXYCODONE HCL 5 MG PO TABS
5.0000 mg | ORAL_TABLET | ORAL | Status: DC | PRN
  Administered 2023-01-14 – 2023-01-15 (×3): 5 mg via ORAL

## 2023-01-14 MED ORDER — CEFAZOLIN SODIUM-DEXTROSE 2-4 GM/100ML-% IV SOLN
2.0000 g | Freq: Four times a day (QID) | INTRAVENOUS | Status: AC
  Administered 2023-01-14 – 2023-01-15 (×2): 2 g via INTRAVENOUS
  Filled 2023-01-14 (×2): qty 100

## 2023-01-14 MED ORDER — ZOLPIDEM TARTRATE 5 MG PO TABS
ORAL_TABLET | ORAL | Status: AC
  Filled 2023-01-14: qty 1

## 2023-01-14 MED ORDER — MENTHOL 3 MG MT LOZG: 1.0000 | LOZENGE | OROMUCOSAL | Status: DC | PRN

## 2023-01-14 MED ORDER — PANTOPRAZOLE SODIUM 40 MG PO TBEC
40.0000 mg | DELAYED_RELEASE_TABLET | Freq: Every day | ORAL | Status: DC
  Administered 2023-01-14 – 2023-01-15 (×2): 40 mg via ORAL

## 2023-01-14 MED ORDER — PROPOFOL 1000 MG/100ML IV EMUL
INTRAVENOUS | Status: AC
  Filled 2023-01-14: qty 100

## 2023-01-14 MED ORDER — POLYVINYL ALCOHOL 1.4 % OP SOLN: 1.0000 [drp] | OPHTHALMIC | Status: DC | PRN

## 2023-01-14 MED ORDER — CHLORHEXIDINE GLUCONATE 0.12 % MT SOLN
15.0000 mL | Freq: Once | OROMUCOSAL | Status: AC
  Administered 2023-01-14: 15 mL via OROMUCOSAL

## 2023-01-14 MED ORDER — OXYCODONE HCL 5 MG PO TABS
ORAL_TABLET | ORAL | Status: AC
  Filled 2023-01-14: qty 1

## 2023-01-14 MED ORDER — PANTOPRAZOLE SODIUM 40 MG PO TBEC
DELAYED_RELEASE_TABLET | ORAL | Status: AC
  Filled 2023-01-14: qty 1

## 2023-01-14 MED ORDER — CEFAZOLIN SODIUM-DEXTROSE 2-4 GM/100ML-% IV SOLN
2.0000 g | INTRAVENOUS | Status: AC
  Administered 2023-01-14: 2 g via INTRAVENOUS

## 2023-01-14 MED ORDER — INSULIN ASPART 100 UNIT/ML IJ SOLN
0.0000 [IU] | Freq: Every day | INTRAMUSCULAR | Status: DC
  Administered 2023-01-14 – 2023-01-15 (×2): 3 [IU] via SUBCUTANEOUS

## 2023-01-14 MED ORDER — BUPIVACAINE HCL (PF) 0.5 % IJ SOLN
INTRAMUSCULAR | Status: AC
  Filled 2023-01-14: qty 10

## 2023-01-14 MED ORDER — CHLORHEXIDINE GLUCONATE 0.12 % MT SOLN
OROMUCOSAL | Status: AC
  Filled 2023-01-14: qty 15

## 2023-01-14 MED ORDER — TRAMADOL HCL 50 MG PO TABS
ORAL_TABLET | ORAL | Status: AC
  Filled 2023-01-14: qty 1

## 2023-01-14 MED ORDER — MAGNESIUM OXIDE -MG SUPPLEMENT 400 (240 MG) MG PO TABS
400.0000 mg | ORAL_TABLET | Freq: Every day | ORAL | Status: DC
  Administered 2023-01-14 – 2023-01-15 (×2): 400 mg via ORAL
  Filled 2023-01-14 (×2): qty 1

## 2023-01-14 MED ORDER — LISINOPRIL 5 MG PO TABS
ORAL_TABLET | ORAL | Status: AC
  Filled 2023-01-14: qty 1

## 2023-01-14 MED ORDER — ACETAMINOPHEN 10 MG/ML IV SOLN
INTRAVENOUS | Status: DC | PRN
  Administered 2023-01-14: 1000 mg via INTRAVENOUS

## 2023-01-14 MED ORDER — METOCLOPRAMIDE HCL 5 MG PO TABS: 5.0000 mg | ORAL_TABLET | Freq: Three times a day (TID) | ORAL | Status: DC | PRN

## 2023-01-14 MED ORDER — FENTANYL CITRATE (PF) 100 MCG/2ML IJ SOLN: 25.0000 ug | INTRAMUSCULAR | Status: DC | PRN

## 2023-01-14 MED ORDER — DEXAMETHASONE SODIUM PHOSPHATE 10 MG/ML IJ SOLN
INTRAMUSCULAR | Status: AC
  Filled 2023-01-14: qty 1

## 2023-01-14 MED ORDER — METHOCARBAMOL 500 MG PO TABS
ORAL_TABLET | ORAL | Status: AC
  Filled 2023-01-14: qty 1

## 2023-01-14 MED ORDER — ONDANSETRON HCL 4 MG/2ML IJ SOLN: 4.0000 mg | Freq: Four times a day (QID) | INTRAMUSCULAR | Status: DC | PRN

## 2023-01-14 MED ORDER — SODIUM CHLORIDE 0.9 % IV SOLN: INTRAVENOUS | Status: DC

## 2023-01-14 MED ORDER — PHENYLEPHRINE HCL-NACL 20-0.9 MG/250ML-% IV SOLN: INTRAVENOUS | Status: DC | PRN

## 2023-01-14 MED ORDER — ORAL CARE MOUTH RINSE: 15.0000 mL | Freq: Once | OROMUCOSAL | Status: AC

## 2023-01-14 MED ORDER — MORPHINE SULFATE (PF) 4 MG/ML IV SOLN: 0.5000 mg | INTRAVENOUS | Status: DC | PRN

## 2023-01-14 MED ORDER — ENOXAPARIN SODIUM 30 MG/0.3ML IJ SOSY
30.0000 mg | PREFILLED_SYRINGE | Freq: Two times a day (BID) | INTRAMUSCULAR | Status: DC
  Administered 2023-01-15: 30 mg via SUBCUTANEOUS

## 2023-01-14 MED ORDER — SODIUM CHLORIDE 0.9 % IR SOLN
Status: DC | PRN
  Administered 2023-01-14: 3000 mL

## 2023-01-14 MED ORDER — PROPOFOL 10 MG/ML IV BOLUS
INTRAVENOUS | Status: DC | PRN
  Administered 2023-01-14: 30 mg via INTRAVENOUS
  Administered 2023-01-14: 60 mg via INTRAVENOUS

## 2023-01-14 MED ORDER — OXYCODONE HCL 5 MG PO TABS: 5.0000 mg | ORAL_TABLET | Freq: Once | ORAL | Status: DC | PRN

## 2023-01-14 MED ORDER — INSULIN ASPART 100 UNIT/ML IJ SOLN
INTRAMUSCULAR | Status: AC
  Filled 2023-01-14: qty 1

## 2023-01-14 MED ORDER — ACETAMINOPHEN 500 MG PO TABS
1000.0000 mg | ORAL_TABLET | Freq: Three times a day (TID) | ORAL | Status: DC
  Administered 2023-01-14 – 2023-01-15 (×2): 1000 mg via ORAL

## 2023-01-14 MED ORDER — CEFAZOLIN SODIUM-DEXTROSE 2-4 GM/100ML-% IV SOLN
INTRAVENOUS | Status: AC
  Filled 2023-01-14: qty 100

## 2023-01-14 MED ORDER — DOCUSATE SODIUM 100 MG PO CAPS
100.0000 mg | ORAL_CAPSULE | Freq: Two times a day (BID) | ORAL | Status: DC
  Administered 2023-01-14 – 2023-01-15 (×2): 100 mg via ORAL

## 2023-01-14 MED ORDER — KETOROLAC TROMETHAMINE 15 MG/ML IJ SOLN
INTRAMUSCULAR | Status: AC
  Filled 2023-01-14: qty 1

## 2023-01-14 MED ORDER — METOCLOPRAMIDE HCL 5 MG/ML IJ SOLN: 5.0000 mg | Freq: Three times a day (TID) | INTRAMUSCULAR | Status: DC | PRN

## 2023-01-14 MED ORDER — MIDAZOLAM HCL 5 MG/5ML IJ SOLN
INTRAMUSCULAR | Status: DC | PRN
  Administered 2023-01-14: 2 mg via INTRAVENOUS

## 2023-01-14 MED ORDER — PHENOL 1.4 % MT LIQD: 1.0000 | OROMUCOSAL | Status: DC | PRN

## 2023-01-14 MED ORDER — ONDANSETRON HCL 4 MG/2ML IJ SOLN
INTRAMUSCULAR | Status: DC | PRN
  Administered 2023-01-14: 4 mg via INTRAVENOUS

## 2023-01-14 MED ORDER — KETOROLAC TROMETHAMINE 15 MG/ML IJ SOLN
7.5000 mg | Freq: Four times a day (QID) | INTRAMUSCULAR | Status: DC
  Administered 2023-01-14 – 2023-01-15 (×3): 7.5 mg via INTRAVENOUS

## 2023-01-14 MED ORDER — SURGIPHOR WOUND IRRIGATION SYSTEM - OPTIME: TOPICAL | Status: DC | PRN

## 2023-01-14 MED ORDER — SOOTHE XP XTRA PROTECTION OP SOLN: 2.0000 [drp] | Freq: Every day | OPHTHALMIC | Status: DC | PRN

## 2023-01-14 MED ORDER — TRANEXAMIC ACID-NACL 1000-0.7 MG/100ML-% IV SOLN
INTRAVENOUS | Status: AC
  Filled 2023-01-14: qty 100

## 2023-01-14 MED ORDER — LISINOPRIL 5 MG PO TABS
2.5000 mg | ORAL_TABLET | Freq: Every day | ORAL | Status: DC
  Administered 2023-01-14 – 2023-01-15 (×2): 2.5 mg via ORAL

## 2023-01-14 MED ORDER — ACETAMINOPHEN 500 MG PO TABS: 1000.0000 mg | ORAL_TABLET | Freq: Three times a day (TID) | ORAL | Status: DC

## 2023-01-14 MED ORDER — ONDANSETRON HCL 4 MG/2ML IJ SOLN
INTRAMUSCULAR | Status: AC
  Filled 2023-01-14: qty 2

## 2023-01-14 MED ORDER — BUPIVACAINE HCL (PF) 0.5 % IJ SOLN
INTRAMUSCULAR | Status: DC | PRN
  Administered 2023-01-14: 2.6 mL

## 2023-01-14 MED ORDER — PROPOFOL 500 MG/50ML IV EMUL
INTRAVENOUS | Status: DC | PRN
  Administered 2023-01-14: 100 ug/kg/min via INTRAVENOUS

## 2023-01-14 MED ORDER — TRAMADOL HCL 50 MG PO TABS
50.0000 mg | ORAL_TABLET | Freq: Four times a day (QID) | ORAL | Status: DC | PRN
  Administered 2023-01-14 – 2023-01-15 (×2): 50 mg via ORAL

## 2023-01-14 MED ORDER — MIDAZOLAM HCL 2 MG/2ML IJ SOLN
INTRAMUSCULAR | Status: AC
  Filled 2023-01-14: qty 2

## 2023-01-14 MED ORDER — DOCUSATE SODIUM 100 MG PO CAPS
ORAL_CAPSULE | ORAL | Status: AC
  Filled 2023-01-14: qty 1

## 2023-01-14 MED ORDER — SODIUM CHLORIDE (PF) 0.9 % IJ SOLN
INTRAMUSCULAR | Status: DC | PRN
  Administered 2023-01-14: 71 mL via INTRAMUSCULAR

## 2023-01-14 MED ORDER — TRANEXAMIC ACID-NACL 1000-0.7 MG/100ML-% IV SOLN
1000.0000 mg | INTRAVENOUS | Status: AC
  Administered 2023-01-14 (×2): 1000 mg via INTRAVENOUS

## 2023-01-14 MED ORDER — ACETAMINOPHEN 10 MG/ML IV SOLN
INTRAVENOUS | Status: AC
  Filled 2023-01-14: qty 100

## 2023-01-14 MED ORDER — ACETAMINOPHEN 500 MG PO TABS
ORAL_TABLET | ORAL | Status: AC
  Filled 2023-01-14: qty 2

## 2023-01-14 MED ORDER — DEXAMETHASONE SODIUM PHOSPHATE 10 MG/ML IJ SOLN
8.0000 mg | Freq: Once | INTRAMUSCULAR | Status: AC
  Administered 2023-01-14: 8 mg via INTRAVENOUS

## 2023-01-14 MED ORDER — ONDANSETRON HCL 4 MG PO TABS: 4.0000 mg | ORAL_TABLET | Freq: Four times a day (QID) | ORAL | Status: DC | PRN

## 2023-01-14 MED ORDER — OXYCODONE HCL 5 MG/5ML PO SOLN: 5.0000 mg | Freq: Once | ORAL | Status: DC | PRN

## 2023-01-14 SURGICAL SUPPLY — 78 items
ADH SKN CLS APL DERMABOND .7 (GAUZE/BANDAGES/DRESSINGS) ×1
APL PRP STRL LF DISP 70% ISPRP (MISCELLANEOUS) ×2
BLADE PATELLA REAM PILOT HOLE (MISCELLANEOUS) IMPLANT
BLADE SAGITTAL AGGR TOOTH XLG (BLADE) IMPLANT
BLADE SAW SAG 25X90X1.19 (BLADE) ×1 IMPLANT
BLADE SAW SAG 29X58X.64 (BLADE) ×1 IMPLANT
BOWL CEMENT MIX W/ADAPTER (MISCELLANEOUS) ×1 IMPLANT
BSPLAT TIB 5D E CMNT KN RT (Knees) ×1 IMPLANT
CEMENT BONE R 1X40 (Cement) ×2 IMPLANT
CHLORAPREP W/TINT 26 (MISCELLANEOUS) ×2 IMPLANT
COMP FEM CMT CR PERS SZ8 RT (Joint) ×1 IMPLANT
COMPONENT FEM CMT CR PRS SZ8RT (Joint) IMPLANT
COOLER POLAR GLACIER W/PUMP (MISCELLANEOUS) ×1 IMPLANT
CUFF TOURN SGL QUICK 24 (TOURNIQUET CUFF) ×1
CUFF TOURN SGL QUICK 30 (TOURNIQUET CUFF)
CUFF TRNQT CYL 24X4X16.5-23 (TOURNIQUET CUFF) IMPLANT
CUFF TRNQT CYL 30X4X21-28X (TOURNIQUET CUFF) IMPLANT
DERMABOND ADVANCED .7 DNX12 (GAUZE/BANDAGES/DRESSINGS) ×1 IMPLANT
DRAPE INCISE IOBAN 66X60 STRL (DRAPES) IMPLANT
DRAPE SHEET LG 3/4 BI-LAMINATE (DRAPES) ×1 IMPLANT
DRSG MEPILEX SACRM 8.7X9.8 (GAUZE/BANDAGES/DRESSINGS) ×1 IMPLANT
DRSG OPSITE POSTOP 4X10 (GAUZE/BANDAGES/DRESSINGS) IMPLANT
DRSG OPSITE POSTOP 4X8 (GAUZE/BANDAGES/DRESSINGS) IMPLANT
ELECT REM PT RETURN 9FT ADLT (ELECTROSURGICAL) ×1
ELECTRODE REM PT RTRN 9FT ADLT (ELECTROSURGICAL) ×1 IMPLANT
GLOVE BIO SURGEON STRL SZ8 (GLOVE) ×1 IMPLANT
GLOVE BIOGEL PI IND STRL 8 (GLOVE) ×1 IMPLANT
GLOVE PI ORTHO PRO STRL 7.5 (GLOVE) ×2 IMPLANT
GLOVE PI ORTHO PRO STRL SZ8 (GLOVE) ×2 IMPLANT
GLOVE SURG SYN 7.5 E (GLOVE) ×1 IMPLANT
GLOVE SURG SYN 7.5 PF PI (GLOVE) ×1 IMPLANT
GOWN SRG XL LVL 3 NONREINFORCE (GOWNS) ×1 IMPLANT
GOWN STRL NON-REIN TWL XL LVL3 (GOWNS) ×1
GOWN STRL REUS W/ TWL LRG LVL3 (GOWN DISPOSABLE) ×1 IMPLANT
GOWN STRL REUS W/ TWL XL LVL3 (GOWN DISPOSABLE) ×1 IMPLANT
GOWN STRL REUS W/TWL LRG LVL3 (GOWN DISPOSABLE) ×1
GOWN STRL REUS W/TWL XL LVL3 (GOWN DISPOSABLE) ×1
HANDLE YANKAUER SUCT OPEN TIP (MISCELLANEOUS) ×1 IMPLANT
HOOD PEEL AWAY T7 (MISCELLANEOUS) ×2 IMPLANT
INSERT TIBIA KNEE RIGHT 10 (Joint) IMPLANT
IV NS IRRIG 3000ML ARTHROMATIC (IV SOLUTION) ×1 IMPLANT
KIT TURNOVER KIT A (KITS) ×1 IMPLANT
MANIFOLD NEPTUNE II (INSTRUMENTS) ×1 IMPLANT
MARKER SKIN DUAL TIP RULER LAB (MISCELLANEOUS) ×1 IMPLANT
MAT ABSORB FLUID 56X50 GRAY (MISCELLANEOUS) ×1 IMPLANT
NDL FILTER BLUNT 18X1 1/2 (NEEDLE) ×1 IMPLANT
NDL HYPO 21X1.5 SAFETY (NEEDLE) ×1 IMPLANT
NDL SAFETY ECLIP 18X1.5 (MISCELLANEOUS) ×1 IMPLANT
NEEDLE FILTER BLUNT 18X1 1/2 (NEEDLE) ×1 IMPLANT
NEEDLE HYPO 21X1.5 SAFETY (NEEDLE) ×1 IMPLANT
PACK TOTAL KNEE (MISCELLANEOUS) ×1 IMPLANT
PAD ARMBOARD 7.5X6 YLW CONV (MISCELLANEOUS) ×3 IMPLANT
PAD WRAPON POLAR KNEE (MISCELLANEOUS) ×1 IMPLANT
PENCIL SMOKE EVACUATOR (MISCELLANEOUS) ×1 IMPLANT
PIN DRILL HDLS TROCAR 75 4PK (PIN) IMPLANT
PULSAVAC PLUS IRRIG FAN TIP (DISPOSABLE) ×1
SCREW FEMALE HEX FIX 25X2.5 (ORTHOPEDIC DISPOSABLE SUPPLIES) IMPLANT
SCREW HEX HEADED 3.5X27 DISP (ORTHOPEDIC DISPOSABLE SUPPLIES) IMPLANT
SLEEVE SCD COMPRESS KNEE MED (STOCKING) ×1 IMPLANT
SOLUTION IRRIG SURGIPHOR (IV SOLUTION) ×1 IMPLANT
STEM POLY PAT PLY 32M KNEE (Knees) IMPLANT
STEM TIB ST PERS 14+30 (Stem) IMPLANT
STEM TIBIA 5 DEG SZ E R KNEE (Knees) IMPLANT
SUT DVC 2 QUILL PDO T11 36X36 (SUTURE) ×1 IMPLANT
SUT QUILL MONODERM 3-0 PS-2 (SUTURE) ×1 IMPLANT
SUT VIC AB 0 CT1 36 (SUTURE) ×1 IMPLANT
SUT VIC AB 2-0 CT2 27 (SUTURE) ×2 IMPLANT
SUT VICRYL 1-0 27IN ABS (SUTURE) ×1
SUTURE VICRYL 1-0 27IN ABS (SUTURE) ×1 IMPLANT
SYR 30ML LL (SYRINGE) ×2 IMPLANT
SYR TB 1ML LL NO SAFETY (SYRINGE) ×1 IMPLANT
TAPE CLOTH 3X10 WHT NS LF (GAUZE/BANDAGES/DRESSINGS) ×1 IMPLANT
TIBIA STEM 5 DEG SZ E R KNEE (Knees) ×1 IMPLANT
TIP FAN IRRIG PULSAVAC PLUS (DISPOSABLE) ×1 IMPLANT
TOWEL OR 17X26 4PK STRL BLUE (TOWEL DISPOSABLE) IMPLANT
TRAP FLUID SMOKE EVACUATOR (MISCELLANEOUS) ×1 IMPLANT
WATER STERILE IRR 1000ML POUR (IV SOLUTION) ×1 IMPLANT
WRAPON POLAR PAD KNEE (MISCELLANEOUS) ×1

## 2023-01-14 NOTE — Op Note (Signed)
Patient Name: Sharon Kaufman  WNU:[id number]  Pre-Operative Diagnosis: Right knee Osteoarthritis  Post-Operative Diagnosis: (same)  Procedure: Right Total Knee Arthroplasty  Components/Implants: Femur: Persona 8 Narrow CR   Tibia: Persona Size E w/ 14x13mm stem extension  Poly: 10mm MC  Patella: 32x8.94mm symmetric  Femoral Valgus Cut Angle: 5 degrees  Distal Femoral Re-cut: none  Patella Resurfacing: yes   Date of Surgery: 01/14/2023  Surgeon: Reinaldo Berber MD  Assistant: Amador Cunas PA (present and scrubbed throughout the case, critical for assistance with exposure, retraction, instrumentation, and closure)   Anesthesiologist: Piscitello  Anesthesia: Spinal   Tourniquet Time: 54 min  EBL: 50cc  Complications: None   Brief history: The patient is a 72 year old female with a history of osteoarthritis of the right knee with pain limiting their range of motion and activities of daily living, which has failed multiple attempts at conservative therapy.  The risks and benefits of total knee arthroplasty as definitive surgical treatment were discussed with the patient, who opted to proceed with the operation.  After outpatient medical clearance and optimization was completed the patient was admitted to [facility] for the procedure.  All preoperative films were reviewed and an appropriate surgical plan was made prior to surgery. Preoperative range of motion was 5 to 100 with a 5 degree flexion contracture. The patient was identified as having a varus alignment.   Description of procedure: The patient was brought to the operating room where laterality was confirmed by all those present to be the right side.   Spinal anesthesia was administered and the patient received an intravenous dose of antibiotics for surgical prophylaxis and a dose of tranexamic acid.  Patient is positioned supine on the operating room table with all bony prominences well-padded.  A  well-padded tourniquet was applied to the right thigh.  The knee was then prepped and draped in usual sterile fashion with multiple layers of adhesive and nonadhesive drapes.  All of those present in the operating room participated in a surgical timeout laterality and patient were confirmed.   An Esmarch was wrapped around the extremity and the leg was elevated and the knee flexed.  The tourniquet was inflated to a pressure of 275 mmHg. The Esmarch was removed and the leg was brought down to full extension.  The patella and tibial tubercle identified and outlined using a marking pen and a midline skin incision was made with a knife carried through the subcutaneous tissue down to the extensor retinaculum.  After exposure of the extensor mechanism the medial parapatellar arthrotomy was performed with a scalpel and electrocautery extending down medial and distal to the tibial tubercle taking care to avoid incising the patellar tendon.   A standard medial release was performed over the proximal tibia.  The knee was brought into extension in order to excise the fat pad taking care not to damage the patella tendon.  The superior soft tissue was removed from the anterior surface of the distal femur to visualize for the procedure.  The knee was then brought into flexion with the patella subluxed laterally and subluxing the tibia anteriorly.  The ACL was transected and removed with electrocautery and additional soft tissue was removed from the proximal surface of the tibia to fully expose. The PCL was found to be intact and was preserved.  An extramedullary tibial cutting guide was then applied to the leg with a spring-loaded ankle clamp placed around the distal tibia just above the malleoli the angulation of the guide  was adjusted to give some posterior slope in the tibial resection with an appropriate varus/valgus alignment.  The resection guide was then pinned to the proximal tibia and the proximal tibial surface was  resected with an oscillating saw.  Careful attention was paid to ensure the blade did not disrupt any of the soft tissues including any lateral or medial ligament.  Attention was then turned to the femur, with the knee slightly flexed a opening drill was used to enter the medullary canal of the femur.  After removing the drill marrow was suctioned out to decompress the distal femur.  An intramedullary femoral guide was then inserted into the drill hole and the alignment guide was seated firmly against the distal end of the medial femoral condyle.  The distal femoral cutting guide was then attached and pinned securely to the anterior surface of the femur and the intramedullary rod and alignment guide was removed.  Distal femur resection was then performed with an oscillating saw with retractors protecting medial and laterally.   The distal cutting block was then removed and the extension gap was checked with a spacer.  Extension gap was found to be appropriately sized to accommodate the spacer block.   The femoral sizing guide was then placed securely into the posterior condyles of the femur and the femoral size was measured and determined to be 8.  The size 8; 4-in-1 cutting guide was placed in position and secured with 2 pins.  The anterior posterior and chamfer resections were then performed with an oscillating saw.  Bony fragments and osteophytes were then removed.  Using a lamina spreader the posterior medial and lateral condyles were checked for additional osteophytes and posterior soft tissue remnants.  Any remaining meniscus was removed at this time.  Periarticular injection was performed in the meniscal rims and posterior capsule with aspiration performed to ensure no intravascular injection.   The tibia was then exposed and the tibial trial was pinned onto the plateau after confirming appropriate orientation and rotation.  Using the drill bushing the tibia was prepared to the appropriate drill  depth.  Tibial broach impactor was then driven through the punch guide using a mallet.  The femoral trial component was then inserted onto the femur. A trial tibial polyethylene bearing was then placed and the knee was reduced.  The knee achieved full extension with no hyperextension and was found to be balanced in flexion and extension with the trials in place.  The knee was then brought into full extension the patella was everted and held with 2 Kocher clamps.  The articular surface of the patella was then resected with an patella reamer and saw after careful measurement with a caliper.  The patella was then prepared with the drill guide and a trial patella was placed.  The knee was then taken through range of motion and it was found that the patella articulated appropriately with the trochlea and good patellofemoral motion without subluxation.    The correct final components for implantation were confirmed and opened by the circulator nurse.  The prepared surfaces of the patella femur and tibia were cleaned with pulsatile lavage to remove all blood fat and other material and then the surfaces were dried.  2 bags of cement were mixed under vacuum and the components were cemented into place.  Excess cement was removed with curettes and forceps. A trial polyethylene tibial component was placed and the knee was brought into extension to allow the cement to set.  At  this time the periarticular injection cocktail was placed in the soft tissues surrounding the knee.  After full curing of the cement the balance of the knee was checked again and the final polyethylene size was confirmed. The tibial component was irrigated and locking mechanism checked to ensure it was clear of debris. The real polyethylene tibial component was implanted and the knee was brought through a range of motion.   The knee was then irrigated with copious amount of normal saline via pulsatile lavage to remove all loose bodies and other debris.   The knee was then irrigated with surgiphor betadine based wash and reirrigated with saline.  The tourniquet was then dropped and all bleeding vessels were identified and coagulated.  The arthrotomy was approximated with #1 Vicryl and closed with #2 Quill suture.  The knee was brought into slight flexion and the subcutaneous tissues were closed with 0 Vicryl, 2-0 Vicryl and a running subcuticular 3-0 monoderm quil suture.  Skin was then glued with Dermabond.  A sterile adhesive dressing was then placed along with a sequential compression device to the calf, a Ted stocking, and a cryotherapy cuff.   Sponge, needle, and Lap counts were all correct at the end of the case.   The patient was transferred off of the operating room table to a hospital bed, good pulses were found distally on the operative side.  The patient was transferred to the recovery room in stable condition.

## 2023-01-14 NOTE — Anesthesia Procedure Notes (Addendum)
Spinal  Patient location during procedure: OR Start time: 01/14/2023 1:27 PM End time: 01/14/2023 1:41 PM Reason for block: surgical anesthesia Staffing Performed: resident/CRNA  Anesthesiologist: Piscitello, Cleda Mccreedy, MD Resident/CRNA: Timberlynn Kizziah, [place], CRNA Performed by: Nemesis Rainwater, [place], CRNA Authorized by: Rosaria Ferries, MD   Preanesthetic Checklist Completed: patient identified, IV checked, site marked, risks and benefits discussed, surgical consent, monitors and equipment checked, pre-op evaluation and timeout performed Spinal Block Patient position: sitting Prep: Betadine Patient monitoring: heart rate, continuous pulse ox, blood pressure and cardiac monitor Approach: midline Location: L3-4 Injection technique: single-shot Needle Needle type: Whitacre and Introducer  Needle gauge: 24 G Needle length: 9 cm Assessment Events: CSF return Additional Notes Negative paresthesia. Negative blood return. Positive free-flowing CSF. Expiration date of kit checked and confirmed. Patient tolerated procedure well, without complications.

## 2023-01-14 NOTE — Interval H&P Note (Signed)
Patient history and physical updated. Consent reviewed including risks, benefits, and alternatives to surgery. Patient agrees with above plan to proceed with right total knee arthroplasty  

## 2023-01-14 NOTE — H&P (Signed)
History of Present Illness: The patient is an 72 y.o. female seen in clinic today for history and physical for right total knee arthroplasty with Dr. Audelia Acton on 01/14/2023. Patient has x-rays showing advanced right knee degenerative arthritis with complete loss of joint space in the medial compartment. Her pain is severe and debilitating and interferes with her quality of life and activities daily living. Pain has been increasing over the last 6 months. Pain is 10 out of 10 at its worst. She reports the pain is worse with carrying objects or extended periods of walking. She reports that she will get severe pain sometimes even just standing still. She reports her prior symptoms improved with rest and elevation of the leg. She was being evaluated and treated with pain management and another physician with multiple corticosteroid injections and formal anti-inflammatory treatment without significant improvement of her symptoms. She also underwent bilateral radiofrequency ablation procedures with Dr. Lourdes Sledge with some improvement in her left knee pain but it did not seem to benefit the right knee.  She reports she is limiting her activity and function due to her knee pain and it is affecting her ability to work due to the pain and disability.  Patient is a non-smoker with a BMI of 29.7 she has diabetes with a recent hemoglobin A1c of 6.2.  Past Medical History: Past Medical History:  Diagnosis Date  Allergy  Anxiety  Breast cancer in female (CMS/HHS-HCC)  Diabetes mellitus without complication (CMS/HHS-HCC)  GERD (gastroesophageal reflux disease)  Hypertension   Past Surgical History: Past Surgical History:  Procedure Laterality Date  tonsilectomy 1963  ankle surgery Left 2009  ARTHROSCOPIC ROTATOR CUFF REPAIR Right 2011  REDUCTION MAMMAPLASTY 2014  APPENDECTOMY  JOINT REPLACEMENT  SPLENECTOMY   Past Family History: Family History  Problem Relation Age of Onset  High blood pressure  (Hypertension) Mother  Osteoporosis (Thinning of bones) Mother  Thyroid disease Mother  Colon cancer Maternal Uncle  Brain cancer Maternal Uncle  Diabetes Maternal Grandmother   Medications: Current Outpatient Medications  Medication Sig Dispense Refill  ascorbic acid, vitamin C, (VITAMIN C) 1000 MG tablet Take by mouth  biotin 10 mg Tab Take 10 mg by mouth once daily  blood glucose control, high (ONETOUCH VERIO HIGH CONTROL) Soln Use as instructed with meter. 1 each 0  blood glucose diagnostic (ACCU-CHEK GUIDE TEST STRIPS) test strip USE TO TEST BLOOD SUGAR ONCE DAILY  calcium carbonate-vitamin D3 (CALTRATE 600+D) 600 mg-10 mcg (400 unit) tablet Take 1 tablet by mouth 2 (two) times daily with meals  carBAMazepine (TEGRETOL) 200 mg tablet Take 200 mg by mouth 3 (three) times daily  cetirizine (ZYRTEC) 10 MG tablet Take 10 mg by mouth once daily  famotidine (PEPCID) 20 MG tablet Take by mouth  lancets (ONETOUCH DELICA PLUS LANCET) Use 1 each once daily 360 each 0  lancets (ONETOUCH ULTRASOFT) Use 1 each 3 (three) times daily Use as instructed. 100 each 12  lisinopriL (ZESTRIL) 2.5 MG tablet Take by mouth  LYSINE ORAL Take 1,000 mg by mouth once daily  magnesium oxide 400 mg magnesium Tab Take 400 mg by mouth once a week  meloxicam (MOBIC) 15 MG tablet Take 1 tablet by mouth once daily  metFORMIN (GLUCOPHAGE-XR) 750 MG XR tablet Take 750 mg by mouth 2 (two) times daily  methocarbamoL (ROBAXIN) 500 MG tablet Take 1 tablet (500 mg total) by mouth every 8 (eight) hours as needed (muscle spasms) 24 tablet 0  ONETOUCH VERIO TEST STRIPS test strip Use to  test blood sugar 4 times per day. 100 each 12  semaglutide (OZEMPIC) 2 mg/dose (8 mg/3 mL) pen injector Inject 0.75 mLs (2 mg total) subcutaneously once a week  traMADoL (ULTRAM) 50 mg tablet Take 1 tablet (50 mg total) by mouth every 6 (six) hours as needed for Pain 30 tablet 0  VEVYE 0.1 % Drop Place 1 drop into both eyes 2 (two) times daily   vitamin E 1000 UNIT capsule Take 1,000 Units by mouth once daily  vitamin E mixed/tocotrienol (VITAMIN E COMPLEX ORAL) Take 268 mg by mouth once daily  zinc gluconate 50 mg tablet Take by mouth  zolpidem (AMBIEN) 5 MG tablet Take by mouth   No current facility-administered medications for this visit.   Allergies: Allergies  Allergen Reactions  Seroquel [Quetiapine] Rash  Alendronate Other (See Comments)  dyspepsia Other reaction(s): Other dyspepsia  Other reaction(s): Other dyspepsia  dyspepsia  Atorvastatin Other (See Comments)  Other reaction(s): Other myalgia myalgia  Other reaction(s): Other myalgia  myalgia  Codeine Nausea  Pravastatin Other (See Comments)  myalgias  Risedronate Other (See Comments)  dyspepsia    Visit Vitals: Vitals:  12/30/22 1509  BP: 110/60    Review of Systems:  A comprehensive 14 point ROS was performed, reviewed, and the pertinent orthopaedic findings are documented in the HPI.  Physical Exam: General:  Well developed, well nourished, no apparent distress, normal affect, normal gait with no antalgic component.   HEENT: Head normocephalic, atraumatic, PERRL.   Abdomen: Soft, non tender, non distended, Bowel sounds present.  Heart: Examination of the heart reveals regular, rate, and rhythm. There is no murmur noted on ascultation. There is a normal apical pulse.  Lungs: Lungs are clear to auscultation. There is no wheeze, rhonchi, or crackles. There is normal expansion of bilateral chest walls.   Comprehensive Knee Exam: Gait Non-antalgic and fluid  Alignment Neutral   Inspection Right Left  Skin Normal appearance with no obvious deformity. No ecchymosis or erythema. Normal appearance with no obvious deformity. No ecchymosis or erythema.  Soft Tissue No focal soft tissue swelling No focal soft tissue swelling  Quad Atrophy None None   Palpation  Right Left  Tenderness Medial joint line tenderness to palpation Mild  medial joint line tenderness to palpation  Crepitus + patellofemoral and tibiofemoral crepitus No patellofemoral or tibiofemoral crepitus  Effusion None None   Range of Motion Right Left  Flexion 5-100 0-120  Extension Full knee extension without hyperextension Full knee extension without hyperextension   Ligamentous Exam Right Left  Lachman Normal Normal  Valgus 0 Normal Normal  Valgus 30 Normal Normal  Varus 0 Normal Normal  Varus 30 Normal Normal  Anterior Drawer Normal Normal  Posterior Drawer Normal Normal   Meniscal Exam Right Left  Hyperflexion Test Positive Negative  Hyperextension Test Positive Negative  McMurray's Negative Negative   Neurovascular Right Left  Quadriceps Strength 5/5 5/5  Hamstring Strength 5/5 5/5  Hip Abductor Strength 4/5 4/5  Distal Motor Normal Normal  Distal Sensory Normal light touch sensation Normal light touch sensation  Distal Pulses Normal Normal    Imaging Studies: I have reviewed AP, lateral,sunrise, and flexed PA weight bearing knee X-rays (4 views) of the right knee reviewed by me today show moderate degenerative changes with medial joint space narrowing with medial bone-on-bone articulation, osteophyte formation, subchondral cysts and sclerosis. AP, sunrise, and flexed PA of the left knee also show mild medial joint space narrowing with proximal tibial sclerosis otherwise joint spaces  well-maintained. No fractures or dislocations noted in either knee.  MRI of her right knee without contrast performed 05/07/2022 . Lateral meniscus is intact, medial meniscus shows a radial tear of the body extending into the posterior horn. Cruciate ligaments and collateral ligaments are intact. Patellofemoral joint shows no full-thickness defects with some partial-thickness cartilage loss diffusely. There is full-thickness cartilage loss over the central weightbearing area of both the medial and lateral compartments with kissing lesions on both the  femoral condyles and the tibias. There is a small effusion. Intact extensor mechanism complex. There is some increased signal within the medial tibia with a questionable focal disruption of the cortex consistent with a subchondral insufficiency fracture. Agree with radiologist interpretation.  Assessment:  Right knee osteoarthritis  Plan: Sharon Kaufman is a 72 year old female with advanced right knee osteoarthritis. She has failed conservative treatment. Pain is severe, debilitating interfering with her quality of life and activities day living. Risks, benefits, complications of a right total knee arthroplasty were discussed with the patient, patient has agreed and consented the procedure with Dr. Audelia Acton.  The hospitalization and post-operative care and rehabilitation were also discussed. The use of perioperative antibiotics and DVT prophylaxis were discussed. The risk, benefits and alternatives to a surgical intervention were discussed at length with the patient. The patient was also advised of risks related to the medical comorbidities and elevated body mass index (BMI). A lengthy discussion took place to review the most common complications including but not limited to: stiffness, loss of function, complex regional pain syndrome, deep vein thrombosis, pulmonary embolus, heart attack, stroke, infection, wound breakdown, numbness, intraoperative fracture, damage to nerves, tendon,muscles, arteries or other blood vessels, death and other possible complications from anesthesia. The patient was told that we will take steps to minimize these risks by using sterile technique, antibiotics and DVT prophylaxis when appropriate and follow the patient postoperatively in the office setting to monitor progress. The possibility of recurrent pain, no improvement in pain and actual worsening of pain were also discussed with the patient.   Patient received clearance for surgery. Agrees with above plan to proceed with right  total knee arthroplasty.

## 2023-01-14 NOTE — Anesthesia Preprocedure Evaluation (Signed)
Anesthesia Evaluation  Patient identified by MRN, date of birth, ID band Patient awake    Reviewed: Allergy & Precautions, NPO status , Patient's Chart, lab work & pertinent test results  History of Anesthesia Complications Negative for: history of anesthetic complications  Airway Mallampati: III  TM Distance: <3 FB Neck ROM: full    Dental  (+) Chipped   Pulmonary neg pulmonary ROS, neg shortness of breath, former smoker   Pulmonary exam normal        Cardiovascular Exercise Tolerance: Good hypertension, (-) angina (-) Past MI and (-) DOE Normal cardiovascular exam     Neuro/Psych  PSYCHIATRIC DISORDERS       Neuromuscular disease    GI/Hepatic Neg liver ROS,GERD  Controlled,,  Endo/Other  diabetes, Type 2    Renal/GU      Musculoskeletal   Abdominal   Peds  Hematology negative hematology ROS (+)   Anesthesia Other Findings Past Medical History: No date: Allergy No date: Anxiety No date: Arthritis No date: Breast cancer [facility]) 2008: Breast cancer, left (HCC) No date: Diabetes mellitus without complication (HCC) No date: Fibromyalgia No date: GERD (gastroesophageal reflux disease) No date: History of hepatitis C     Comment:  reversed with medication No date: Hyperlipidemia No date: Hypertension No date: NASH (nonalcoholic steatohepatitis) 2008: Personal history of radiation therapy     Comment:  Left Breast Cancer  Past Surgical History: 2009: ANKLE FUSION; Left 2009: APPENDECTOMY 2008: BREAST LUMPECTOMY; Left No date: CATARACT EXTRACTION, BILATERAL No date: COLONOSCOPY No date: ESOPHAGOGASTRODUODENOSCOPY 2014: REDUCTION MAMMAPLASTY; Right 2011: ROTATOR CUFF REPAIR; 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 No date: TONSILLECTOMY 2019: TOTAL SHOULDER REPLACEMENT; Right  BMI    Body Mass Index: 27.46 kg/m      Reproductive/Obstetrics negative OB ROS                              Anesthesia Physical Anesthesia Plan  ASA: 3  Anesthesia Plan: Spinal   Post-op Pain Management:    Induction:   PONV Risk Score and Plan:   Airway Management Planned: Natural Airway and Nasal Cannula  Additional Equipment:   Intra-op Plan:   Post-operative Plan:   Informed Consent: I have reviewed the patients History and Physical, chart, labs and discussed the procedure including the risks, benefits and alternatives for the proposed anesthesia with the patient or authorized representative who has indicated his/her understanding and acceptance.     Dental Advisory Given  Plan Discussed with: Anesthesiologist, CRNA and Surgeon  Anesthesia Plan Comments: (Patient reports no bleeding problems and no anticoagulant use.  Plan for spinal with backup GA  Patient consented for risks of anesthesia including but not limited to:  - adverse reactions to medications - damage to eyes, teeth, lips or other oral mucosa - nerve damage due to positioning  - risk of bleeding, infection and or nerve damage from spinal that could lead to paralysis - risk of headache or failed spinal - damage to teeth, lips or other oral mucosa - sore throat or hoarseness - damage to heart, brain, nerves, lungs, other parts of body or loss of life  Patient voiced understanding.)       Anesthesia Quick Evaluation

## 2023-01-14 NOTE — Transfer of Care (Signed)
Immediate Anesthesia Transfer of Care Note  Patient: Sharon Kaufman  Procedure(s) Performed: TOTAL KNEE ARTHROPLASTY (Right: Knee)  Patient Location: PACU  Anesthesia Type:General  Level of Consciousness: awake, drowsy, and patient cooperative  Airway & Oxygen Therapy: Patient Spontanous Breathing and Patient connected to face mask oxygen  Post-op Assessment: Report given to RN and Post -op Vital signs reviewed and stable  Post vital signs: Reviewed and stable  Last Vitals:  Vitals Value Taken Time  BP 104/79 01/14/23 1534  Temp    Pulse 101 01/14/23 1537  Resp 24 01/14/23 1537  SpO2 97 % 01/14/23 1537  Vitals shown include unfiled device data.  Last Pain:  Vitals:   01/14/23 1149  TempSrc: Temporal  PainSc: 0-No pain      Patients Stated Pain Goal: 0 (01/14/23 1149)  Complications: No notable events documented.

## 2023-01-15 ENCOUNTER — Encounter: Payer: Self-pay | Admitting: Orthopedic Surgery

## 2023-01-15 DIAGNOSIS — M1711 Unilateral primary osteoarthritis, right knee: Secondary | ICD-10-CM | POA: Diagnosis not present

## 2023-01-15 LAB — CBC
HCT: 33 % — ABNORMAL LOW (ref 36.0–46.0)
Hemoglobin: 11.4 g/dL — ABNORMAL LOW (ref 12.0–15.0)
MCH: 30.2 pg (ref 26.0–34.0)
MCHC: 34.5 g/dL (ref 30.0–36.0)
MCV: 87.3 fL (ref 80.0–100.0)
Platelets: 306 10*3/uL (ref 150–400)
RBC: 3.78 MIL/uL — ABNORMAL LOW (ref 3.87–5.11)
RDW: 14.6 % (ref 11.5–15.5)
WBC: 18.4 10*3/uL — ABNORMAL HIGH (ref 4.0–10.5)
nRBC: 0 % (ref 0.0–0.2)

## 2023-01-15 LAB — BASIC METABOLIC PANEL
Anion gap: 12 (ref 5–15)
BUN: 14 mg/dL (ref 8–23)
CO2: 22 mmol/L (ref 22–32)
Calcium: 9.2 mg/dL (ref 8.9–10.3)
Chloride: 104 mmol/L (ref 98–111)
Creatinine, Ser: 0.62 mg/dL (ref 0.44–1.00)
GFR, Estimated: >60 mL/min (ref >60)
Glucose, Bld: 219 mg/dL — ABNORMAL HIGH (ref 70–99)
Potassium: 4.2 mmol/L (ref 3.5–5.1)
Sodium: 138 mmol/L (ref 135–145)

## 2023-01-15 LAB — GLUCOSE, CAPILLARY: Glucose-Capillary: 176 mg/dL — ABNORMAL HIGH (ref 70–99)

## 2023-01-15 MED ORDER — TRAMADOL HCL 50 MG PO TABS: 50.0000 mg | ORAL_TABLET | Freq: Four times a day (QID) | ORAL | 0 refills | Status: DC | PRN

## 2023-01-15 MED ORDER — DOCUSATE SODIUM 100 MG PO CAPS: 100.0000 mg | ORAL_CAPSULE | Freq: Two times a day (BID) | ORAL | 0 refills | Status: AC

## 2023-01-15 MED ORDER — KETOROLAC TROMETHAMINE 15 MG/ML IJ SOLN
INTRAMUSCULAR | Status: AC
  Filled 2023-01-15: qty 1

## 2023-01-15 MED ORDER — CELECOXIB 200 MG PO CAPS: 200.0000 mg | ORAL_CAPSULE | Freq: Two times a day (BID) | ORAL | 0 refills | Status: AC

## 2023-01-15 MED ORDER — ACETAMINOPHEN 500 MG PO TABS: 1000.0000 mg | ORAL_TABLET | Freq: Three times a day (TID) | ORAL | 0 refills | Status: AC

## 2023-01-15 MED ORDER — INSULIN ASPART 100 UNIT/ML IJ SOLN
INTRAMUSCULAR | Status: AC
  Filled 2023-01-15: qty 1

## 2023-01-15 MED ORDER — ONDANSETRON HCL 4 MG PO TABS: 4.0000 mg | ORAL_TABLET | Freq: Four times a day (QID) | ORAL | 0 refills | Status: DC | PRN

## 2023-01-15 MED ORDER — ENOXAPARIN SODIUM 40 MG/0.4ML IJ SOSY
40.0000 mg | PREFILLED_SYRINGE | INTRAMUSCULAR | 0 refills | Status: DC
Start: 2023-01-15 — End: 2023-07-05

## 2023-01-15 MED ORDER — OXYCODONE HCL 5 MG PO TABS
ORAL_TABLET | ORAL | Status: AC
  Filled 2023-01-15: qty 1

## 2023-01-15 MED ORDER — METHOCARBAMOL 500 MG PO TABS: 500.0000 mg | ORAL_TABLET | Freq: Three times a day (TID) | ORAL | 0 refills | Status: DC | PRN

## 2023-01-15 MED ORDER — ENOXAPARIN SODIUM 30 MG/0.3ML IJ SOSY
PREFILLED_SYRINGE | INTRAMUSCULAR | Status: AC
  Filled 2023-01-15: qty 0.3

## 2023-01-15 MED ORDER — TRAMADOL HCL 50 MG PO TABS
ORAL_TABLET | ORAL | Status: AC
  Filled 2023-01-15: qty 1

## 2023-01-15 MED ORDER — LISINOPRIL 5 MG PO TABS
ORAL_TABLET | ORAL | Status: AC
  Filled 2023-01-15: qty 1

## 2023-01-15 MED ORDER — PANTOPRAZOLE SODIUM 40 MG PO TBEC
DELAYED_RELEASE_TABLET | ORAL | Status: AC
  Filled 2023-01-15: qty 1

## 2023-01-15 MED ORDER — ACETAMINOPHEN 500 MG PO TABS
ORAL_TABLET | ORAL | Status: AC
  Filled 2023-01-15: qty 2

## 2023-01-15 MED ORDER — OXYCODONE HCL 5 MG PO TABS: 2.5000 mg | ORAL_TABLET | Freq: Four times a day (QID) | ORAL | 0 refills | Status: DC | PRN

## 2023-01-15 MED ORDER — CEFAZOLIN SODIUM-DEXTROSE 2-4 GM/100ML-% IV SOLN
INTRAVENOUS | Status: AC
  Filled 2023-01-15: qty 100

## 2023-01-15 MED ORDER — DOCUSATE SODIUM 100 MG PO CAPS
ORAL_CAPSULE | ORAL | Status: AC
  Filled 2023-01-15: qty 1

## 2023-01-15 NOTE — Anesthesia Postprocedure Evaluation (Signed)
Anesthesia Post Note  Patient: Rimas Abdo Ales  Procedure(s) Performed: TOTAL KNEE ARTHROPLASTY (Right: Knee)  Patient location during evaluation: Nursing Unit Anesthesia Type: Spinal Level of consciousness: oriented and awake and alert Pain management: pain level controlled Vital Signs Assessment: post-procedure vital signs reviewed and stable Respiratory status: spontaneous breathing and respiratory function stable Cardiovascular status: blood pressure returned to baseline and stable Postop Assessment: no headache, no backache, no apparent nausea or vomiting and patient able to bend at knees Anesthetic complications: no   No notable events documented.   Last Vitals:  Vitals:   01/15/23 0151 01/15/23 0643  BP: (!) 152/77 (!) 155/89  Pulse: (!) 102 96  Resp: 16 16  Temp: 36.6 C 36.7 C  SpO2: 96% 98%    Last Pain:  Vitals:   01/15/23 0645  TempSrc:   PainSc: 6                  Jules Schick

## 2023-01-15 NOTE — Evaluation (Signed)
Physical Therapy Evaluation Patient Details Name: Sharon Kaufman MRN: [id number] DOB: Oct 16, 1950 Today's Date: 01/15/2023  History of Present Illness  Patient is a 72 y.o. female who is presenting to hospital with R knee pain. PMH includes: DM, HTN, bilateral radiofrequency ablation procedures in knees, L ankle fusion, and breast cancer. Current MD assessment includes: s/p R TKA  Clinical Impression  Pt was pleasant and motivated to participate during the session and put forth good effort throughout. Pt found seated in recliner. Pt is currently CGA for STS transfers with RW, provided cues for hand placement and RLE management. Pt able to amb 100 feet x 2 with RW and CGA level, pt initally using step to walking pattern but quickly progressed to step through ambulation with cues and encouragement. Pt performing stair training between ambulatory bouts. Pt able to perform stairs with cues for safe sequencing at CGA. Pt educated on safe car transfer sequencing. Pt vitals remained within functional ranges appropriate for activity throughout session.  Pt will benefit from continued PT services upon discharge to safely address deficits listed in patient problem list for decreased caregiver assistance and eventual return to PLOF.       If plan is discharge home, recommend the following: A little help with walking and/or transfers;A little help with bathing/dressing/bathroom;Assist for transportation;Help with stairs or ramp for entrance;Assistance with cooking/housework   Can travel by private vehicle        Equipment Recommendations BSC/3in1;Rolling walker (2 wheels)  Recommendations for Other Services       Functional Status Assessment Patient has had a recent decline in their functional status and demonstrates the ability to make significant improvements in function in a reasonable and predictable amount of time.     Precautions / Restrictions Precautions Precautions: Fall;Knee Precaution  Booklet Issued: Yes (comment) Precaution Comments: HEP packet issued Restrictions Weight Bearing Restrictions: Yes RLE Weight Bearing: Weight bearing as tolerated      Mobility  Bed Mobility               General bed mobility comments: N/T pt found in recliner    Transfers Overall transfer level: Needs assistance Equipment used: Rolling walker (2 wheels) Transfers: Sit to/from Stand Sit to Stand: Contact guard assist           General transfer comment: provided cues for hand placement and sequencing for safety.    Ambulation/Gait Ambulation/Gait assistance: Contact guard assist Gait Distance (Feet): 100 Feet Assistive device: Rolling walker (2 wheels)   Gait velocity: decreased     General Gait Details: Inital cues for step-to pattern, pt quickly progressed to step through pattern. Provided cues for upright posture and hand placement. pt having no increased pain.  Stairs Stairs: Yes Stairs assistance: Contact guard assist Stair Management: One rail Right, Step to pattern, Forwards Number of Stairs: 4 General stair comments: following education and demonstration, pt performed 4 steps ascending with R hand rail and descending with same rail, utilizing step to pattern. Pt steady throughout, no buckling seen.  Wheelchair Mobility     Tilt Bed    Modified Rankin (Stroke Patients Only)       Balance Overall balance assessment: Mild deficits observed, not formally tested                                           Pertinent Vitals/Pain Pain Assessment Pain Assessment: 0-10 Pain  Score: 7  Pain Location: R Knee Pain Descriptors / Indicators: Aching, Sore, Grimacing, Guarding Pain Intervention(s): Limited activity within patient's tolerance, Monitored during session, Premedicated before session    Home Living Family/patient expects to be discharged to:: Private residence Living Arrangements: Alone Available Help at Discharge:  Family;Available 24 hours/day (DIL will be staying with pt for the next 4 days) Type of Home: House (split level) Home Access: Stairs to enter Entrance Stairs-Rails: Right Entrance Stairs-Number of Steps: 5 Alternate Level Stairs-Number of Steps: Split level: 5 steps down to main level, 5 steps up to upper level, bilateral hand rail access for both sets of stairs Home Layout: Two level Home Equipment: Cane - single point;Grab bars - tub/shower      Prior Function Prior Level of Function : Independent/Modified Independent             Mobility Comments: indep ADLs Comments: indep     Extremity/Trunk Assessment   Upper Extremity Assessment Upper Extremity Assessment: Overall WFL for tasks assessed    Lower Extremity Assessment Lower Extremity Assessment: Defer to PT evaluation RLE Deficits / Details: s/p R TKA    Cervical / Trunk Assessment Cervical / Trunk Assessment: Normal  Communication   Communication Communication: No apparent difficulties  Cognition Arousal: Alert Behavior During Therapy: WFL for tasks assessed/performed Overall Cognitive Status: Within Functional Limits for tasks assessed                                          General Comments      Exercises Total Joint Exercises Ankle Circles/Pumps: 10 reps, Both, Strengthening, AROM Quad Sets: AROM, 10 reps, Right, Strengthening Hip ABduction/ADduction: 5 reps, Right, Strengthening, AROM Straight Leg Raises: 5 reps, Right, Strengthening, AROM Goniometric ROM: R knee AROM: 0-88 deg Other Exercises Other Exercises: Pt educated on safe car transfer sequencing with pt verbalizing understanding Other Exercises: Pt educated on safe stair navigation with pt verbalizing understanding   Assessment/Plan    PT Assessment Patient needs continued PT services  PT Problem List Decreased strength;Decreased range of motion;Decreased activity tolerance;Decreased balance;Decreased mobility        PT Treatment Interventions DME instruction;Balance training;Gait training;Stair training;Functional mobility training;Therapeutic activities;Therapeutic exercise    PT Goals (Current goals can be found in the Care Plan section)  Acute Rehab PT Goals Patient Stated Goal: walk without limping PT Goal Formulation: With patient Time For Goal Achievement: 01/28/23 Potential to Achieve Goals: Good    Frequency BID     Co-evaluation               AM-PAC PT "6 Clicks" Mobility  Outcome Measure Help needed turning from your back to your side while in a flat bed without using bedrails?: A Little Help needed moving from lying on your back to sitting on the side of a flat bed without using bedrails?: A Little Help needed moving to and from a bed to a chair (including a wheelchair)?: A Little Help needed standing up from a chair using your arms (e.g., wheelchair or bedside chair)?: A Little Help needed to walk in hospital room?: A Little Help needed climbing 3-5 steps with a railing? : A Little 6 Click Score: 18    End of Session Equipment Utilized During Treatment: Gait belt Activity Tolerance: Patient tolerated treatment well Patient left: in chair;with call bell/phone within reach Nurse Communication: Mobility status PT Visit Diagnosis: Other  abnormalities of gait and mobility (R26.89);Pain;Muscle weakness (generalized) (M62.81) Pain - part of body: Knee    Time: 0920-0955 PT Time Calculation (min) (ACUTE ONLY): 35 min   Charges:                 Cecile Sheerer, SPT 01/15/23, 3:04 PM

## 2023-01-15 NOTE — Discharge Instructions (Signed)

## 2023-01-15 NOTE — Progress Notes (Signed)
Patient is not able to walk the distance required to go the bathroom, or she is unable to safely negotiate stairs required to access the bathroom.  A 3in1 BSC will alleviate this problem.       T. Chris Gaines, PA-C [facility] 

## 2023-01-15 NOTE — Plan of Care (Signed)
  Problem: Fluid Volume: Goal: Ability to maintain a balanced intake and output will improve Outcome: Progressing   Problem: Health Behavior/Discharge Planning: Goal: Ability to manage health-related needs will improve Outcome: Progressing   Problem: Tissue Perfusion: Goal: Adequacy of tissue perfusion will improve Outcome: Progressing   Problem: Education: Goal: Knowledge of the prescribed therapeutic regimen will improve Outcome: Progressing   Problem: Activity: Goal: Range of joint motion will improve Outcome: Progressing   Problem: Clinical Measurements: Goal: Postoperative complications will be avoided or minimized Outcome: Progressing   Problem: Pain Management: Goal: Pain level will decrease with appropriate interventions Outcome: Progressing

## 2023-01-15 NOTE — Discharge Summary (Signed)
Physician Discharge Summary  Patient ID: Sharon Kaufman MRN: [id number] DOB/AGE: Sep 10, 1950 72 y.o.  Admit date: 01/14/2023 Discharge date: 01/15/2023  Admission Diagnoses:  S/P TKR (total knee replacement) using cement, right [Z96.651]   Discharge Diagnoses: Patient Active Problem List   Diagnosis Date Noted   S/P TKR (total knee replacement) using cement, right 01/14/2023   Lung nodule 12/25/2022   Left rotator cuff tear arthropathy (hx of left shoulder surgery) 07/02/2022   Chronic painful diabetic neuropathy (HCC) 07/02/2022   Old tear of medial meniscus of right knee 07/02/2022   Abnormal echocardiogram 03/21/2022   Pes anserinus bursitis of right knee 03/17/2022   Stenosing tenosynovitis of finger of right hand 02/10/2022   Peroneal tendinitis, right 10/03/2021   Positive ANA (antinuclear antibody) 08/22/2021   Chronic musculoskeletal pain 08/05/2021   Osteoarthritis (arthritis due to wear and tear of joints) 07/29/2021   Osteoarthritis of right knee 07/29/2021   Plantar fasciitis, right 07/29/2021   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   Statin intolerance 03/21/2021   Acquired trigger finger of right middle finger 03/10/2021   Ankylosis of ankle joint, left 03/10/2021   Cirrhosis (HCC) 07/25/2020   Bronchiectasis without complication (HCC) 07/03/2020   Deviated septum 03/22/2020   Allergic rhinitis due to pollen 03/22/2020   Overweight (BMI 25.0-29.9) 12/06/2019   History of cardiovascular stress test 10/02/2019   Diastolic dysfunction without heart failure 10/02/2019   Dyspareunia in female 08/02/2019   Cataract, nuclear sclerotic senile, right 07/24/2019   Fibromyalgia 07/11/2019   History of hepatitis C 07/11/2019   Rheumatoid factor positive 06/06/2019   Other insomnia 06/06/2019   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   Essential hypertension 06/06/2019   Diabetes type 2,  controlled (HCC) 06/06/2019   Anxiety 06/06/2019   Age-related osteoporosis without current pathological fracture 01/03/2013   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/2012    Past Medical History:  Diagnosis Date   Allergy    Anxiety    Arthritis    Breast cancer (HCC)    Breast cancer, left (HCC) 2008   Diabetes mellitus without complication (HCC)    Fibromyalgia    GERD (gastroesophageal reflux disease)    History of hepatitis C    reversed with medication   Hyperlipidemia    Hypertension    NASH (nonalcoholic steatohepatitis)    Personal history of radiation therapy 2008   Left Breast Cancer     Transfusion: None   Consultants (if any):   Discharged Condition: Improved  Hospital Course: AVAMARIE TOSCANO is an 72 y.o. female who was admitted 01/14/2023 with a diagnosis of S/P TKR (total knee replacement) using cement, right and went to the operating room on 01/14/2023 and underwent the above named procedures.    Surgeries: Procedure(s): TOTAL KNEE ARTHROPLASTY on 01/14/2023 Patient tolerated the surgery well. Taken to PACU where she was stabilized and then transferred to the orthopedic floor.  Started on Lovenox 30 mg q 12 hrs. TEDs and SCDs applied bilaterally. Heels elevated on bed. No evidence of DVT. Negative Homan. Physical therapy started on day #1 for gait training and transfer. OT started day #1 for ADL and assisted devices.  Patient's IV was d/c on day #1.  Patient noted to have slightly elevated blood pressure slightly above her baseline.  Slight elevation in BP was thought to be secondary to pain.  Blood pressures did improve closer to baseline.  Patient asymptomatic.  Patient was  able to safely and independently complete all PT goals. PT recommending discharge to home.    On post op day #1 patient was stable and ready for discharge to home with home health PT.   Patient given instructions on recording blood pressures at home and if elevated pressures above 150  systolic, 90 diastolic recommend she contact PCP to discuss increasing her 2.5 mg daily lisinopril  Implants:  Femur: Persona 8 Narrow CR   Tibia: Persona Size E w/ 14x57mm stem extension  Poly: 10mm MC  Patella: 32x8.12mm symmetric    She was given perioperative antibiotics:  Anti-infectives (From admission, onward)    Start     Dose/Rate Route Frequency Ordered Stop   01/14/23 1830  ceFAZolin (ANCEF) IVPB 2g/100 mL premix        2 g 200 mL/hr over 30 Minutes Intravenous Every 6 hours 01/14/23 1611 01/15/23 0228   01/14/23 1145  ceFAZolin (ANCEF) IVPB 2g/100 mL premix        2 g 200 mL/hr over 30 Minutes Intravenous On call to O.R. 01/14/23 1138 01/14/23 1400     .  She was given sequential compression devices, early ambulation, and Lovenox, teds for DVT prophylaxis.  She benefited maximally from the hospital stay and there were no complications.    Recent vital signs:  Vitals:   01/15/23 0151 01/15/23 0643  BP: (!) 152/77 (!) 155/89  Pulse: (!) 102 96  Resp: 16 16  Temp: 97.8 F (36.6 C) 98.1 F (36.7 C)  SpO2: 96% 98%    Recent laboratory studies:  Lab Results  Component Value Date   HGB 11.4 (L) 01/15/2023   HGB 13.4 01/05/2023   HGB 13.3 02/13/2022   Lab Results  Component Value Date   WBC 18.4 (H) 01/15/2023   PLT 306 01/15/2023   Lab Results  Component Value Date   INR 1.0 08/06/2022   Lab Results  Component Value Date   NA 138 01/15/2023   K 4.2 01/15/2023   CL 104 01/15/2023   CO2 22 01/15/2023   BUN 14 01/15/2023   CREATININE 0.62 01/15/2023   GLUCOSE 219 (H) 01/15/2023    Discharge Medications:   Allergies as of 01/15/2023       Reactions   Quetiapine Rash   Alendronate Other (See Comments)   dyspepsia   Atorvastatin Other (See Comments)   myalgia   Codeine Itching, Nausea Only   Pravastatin Other (See Comments)   myalgias   Risedronate Other (See Comments)   dyspepsia   Hydrocodone-acetaminophen    Amitriptyline Rash         Medication List     STOP taking these medications    ibuprofen 200 MG tablet Commonly known as: ADVIL   meloxicam 15 MG tablet Commonly known as: MOBIC       TAKE these medications    Accu-Chek FastClix Lancets Misc 1 each by Other route daily.   Accu-Chek Guide test strip Generic drug: glucose blood USE 1 DAILY.   acetaminophen 500 MG tablet Commonly known as: TYLENOL Take 2 tablets (1,000 mg total) by mouth every 8 (eight) hours. What changed:  when to take this reasons to take this   Biotin 10 MG Caps Take 10 mg by mouth daily.   CALCIUM CARBONATE ANTACID PO Take 2 tablets by mouth at bedtime as needed.   celecoxib 200 MG capsule Commonly known as: CeleBREX Take 1 capsule (200 mg total) by mouth 2 (two) times daily for 14 days.  docusate sodium 100 MG capsule Commonly known as: COLACE Take 1 capsule (100 mg total) by mouth 2 (two) times daily.   enoxaparin 40 MG/0.4ML injection Commonly known as: LOVENOX Inject 0.4 mLs (40 mg total) into the skin daily for 14 days.   fluticasone 50 MCG/ACT nasal spray Commonly known as: FLONASE Place 1 spray into both nostrils 2 (two) times daily as needed for allergies or rhinitis.   Glycerin 0.25 % Soln Apply 1 drop to eye daily as needed (both eyes).   ketotifen 0.035 % ophthalmic solution Commonly known as: ZADITOR Place 1 drop into both eyes 2 (two) times daily as needed.   Lastacaft 0.25 % Soln Generic drug: Alcaftadine Apply 1-2 drops to eye 2 (two) times daily as needed.   lisinopril 2.5 MG tablet Commonly known as: ZESTRIL Take 1 tablet (2.5 mg total) by mouth daily.   LORazepam 2 MG tablet Commonly known as: ATIVAN Take 2 mg by mouth daily as needed.   LYSINE PO Take 1,000 mg by mouth daily.   MAGNESIUM PO Take 550 mg by mouth daily.   metFORMIN 750 MG 24 hr tablet Commonly known as: GLUCOPHAGE-XR TAKE 2 TABLETS EVERY DAY WITH BREAKFAST What changed: See the new instructions.    methocarbamol 500 MG tablet Commonly known as: ROBAXIN Take 1-2 tablets (500-1,000 mg total) by mouth every 8 (eight) hours as needed for muscle spasms. What changed: how much to take   ondansetron 4 MG tablet Commonly known as: ZOFRAN Take 1 tablet (4 mg total) by mouth every 6 (six) hours as needed for nausea.   oxyCODONE 5 MG immediate release tablet Commonly known as: Oxy IR/ROXICODONE Take 0.5-1 tablets (2.5-5 mg total) by mouth every 6 (six) hours as needed for severe pain or breakthrough pain.   PEPCID PO Take 20 mg by mouth daily.   Semaglutide(0.25 or 0.5MG /DOS) 2 MG/3ML Sopn Inject 1 mg into the skin once a week.   Soothe XP Xtra Protection Soln Apply 2 drops to eye daily as needed (both eyes).   traMADol 50 MG tablet Commonly known as: ULTRAM Take 1 tablet (50 mg total) by mouth every 6 (six) hours as needed for moderate pain. What changed: reasons to take this   zolpidem 5 MG tablet Commonly known as: AMBIEN Take 5 mg by mouth at bedtime as needed for sleep. Psych        Diagnostic Studies: DG Knee 1-2 Views Right  Result Date: 01/14/2023 CLINICAL DATA:  Elective surgery. EXAM: RIGHT KNEE - 1-2 VIEW COMPARISON:  Right knee radiographs 07/29/2021 FINDINGS: Interval total right knee arthroplasty. No perihardware lucency is seen to indicate hardware failure or loosening. Moderate joint effusion. Expected postoperative intra-articular air and subcutaneous air. No acute fracture or dislocation. IMPRESSION: Interval total right knee arthroplasty without evidence of hardware failure or loosening. Electronically Signed   By: Neita Garnet M.D.   On: 01/14/2023 18:00   CT CHEST WO CONTRAST  Result Date: 12/29/2022 CLINICAL DATA:  Nodule follow-up EXAM: CT CHEST WITHOUT CONTRAST TECHNIQUE: Multidetector CT imaging of the chest was performed following the standard protocol without IV contrast. RADIATION DOSE REDUCTION: This exam was performed according to the departmental  dose-optimization program which includes automated exposure control, adjustment of the mA and/or kV according to patient size and/or use of iterative reconstruction technique. COMPARISON:  PET-CT dated Sep 23, 2022; chest CT dated September 09, 2022; chest CT dated June 26, 2022 FINDINGS: Cardiovascular: Normal heart size. Trace pericardial effusion. Normal caliber thoracic aorta  with moderate calcified plaque. No visible coronary artery calcifications. Mediastinum/Nodes: Esophagus and thyroid are unremarkable. No enlarged lymph nodes seen in the chest. Lungs/Pleura: Central airways are patent. Irregular solid nodule of the right lower lobe with associated bronchiectasis and peripheral ground-glass measuring 2.7 x 2.4 cm series 3, image 113 solid component measures to 1.7 x 1.1 cm on image 112, unchanged when compared with prior. No pleural effusion or pneumothorax. Upper Abdomen: Cirrhotic liver morphology. Exophytic lesion of the posterior right hepatic lobe, previously characterized on prior abdominal MRIs and consistent with benign proteinaceous cyst. Musculoskeletal: Stable soft tissue nodule of the anterior abdominal wall measuring 8 mm on series 2 image 162. Prior total right shoulder arthroplasty. No aggressive appearing osseous lesions. IMPRESSION: 1. Stable irregular solid and ground-glass nodule of the right lower lobe. Demonstrated mild FDG activity on prior PET-CT, although given morphology, remains concerning for indolent primary lung adenocarcinoma. Recommend continued follow-up with 6-12 month chest CT, alternatively, tissue sampling could considered. 2. Cirrhotic liver morphology. 3. Aortic Atherosclerosis (ICD10-I70.0). Electronically Signed   By: Allegra Lai M.D.   On: 12/29/2022 14:44    Disposition:      Follow-up Information     Evon Slack, PA-C Follow up.   Specialties: Orthopedic Surgery, Emergency Medicine Contact information: [address] [place]  [address] [phone]                  Signed: Patience Musca 01/15/2023, 8:35 AM

## 2023-01-15 NOTE — TOC Progression Note (Signed)
Transition of Care [facility]) - Progression Note    Patient Details  Name: Sharon Kaufman MRN: [id number] Date of Birth: December 06, 1950  [facility]) CM/SW Contact  Marlowe Sax, RN Phone Number: 01/15/2023, 9:46 AM  Clinical Narrative:    Patient was set up with [facility] thru [facility] prior to surgery by Surgeons office 3 in 1 and RW to be delivered to the bedside by [employer]   Expected Discharge Plan: Home w Home Health Services Barriers to Discharge: No Barriers Identified  Expected Discharge Plan and Services   Discharge Planning Services: CM Consult Post Acute Care Choice: Durable Medical Equipment Living arrangements for the past 2 months: Single Family Home Expected Discharge Date: 01/15/23               DME Arranged: Dan Humphreys rolling, 3-N-1 DME Agency: [employer] Date DME Agency Contacted: 01/15/23 Time DME Agency Contacted: [phone] Representative spoke with at DME Agency: Osvaldo Angst Arranged: PT, OT [facility] Agency: [facility] Date [facility] Agency Contacted: 01/15/23 Time HH Agency Contacted: [phone] Representative spoke with at [facility] Agency: Kandee Keen   Social Determinants of Health (SDOH) Interventions SDOH Screenings   Food Insecurity: No Food Insecurity (01/14/2023)  Housing: Low Risk  (01/14/2023)  Transportation Needs: No Transportation Needs (01/14/2023)  Utilities: Not At Risk (01/14/2023)  Alcohol Screen: Low Risk  (11/12/2022)  Depression (PHQ2-9): Low Risk  (12/03/2022)  Financial Resource Strain: Low Risk  (12/29/2022)   Received from [facility]  Physical Activity: Unknown (11/12/2022)  Social Connections: Socially Isolated (11/12/2022)  Stress: Stress Concern Present (11/12/2022)  Tobacco Use: Medium Risk (01/14/2023)    Readmission Risk Interventions     No data to display

## 2023-01-15 NOTE — Evaluation (Signed)
Occupational Therapy Evaluation Patient Details Name: Sharon Kaufman MRN: [id number] DOB: 01-20-1951 Today's Date: 01/15/2023   History of Present Illness Patient is a 72 y.o. female who is presenting to hospital with R knee pain. PMH includes: DM, HTN, bilateral radiofrequency ablation procedures in knees, L ankle fusion, and breast cancer. Current MD assessment includes: s/p R TKA   Clinical Impression   Pt sitting up in chair upon OT arrival and agreeable to OT evaluation.  Pt reports she will have her DIL in the home 24/7 for the first 4 days after d/c home.  Previously indep with ADLs and mobility.  3in1 and RW have been ordered.  Pt demos sufficient reach to feet to perform LB ADLs without AE. No additional DME needed at this time.  OT did advise pt on walker accessories as she does live alone, including walker bag, vs tray, and options to obtain, and educ on safe item transport techniques.  Educ pt on uses of 3in1 commode, including use in the shower as needed.  Reviewed fall prevention strategies, including donning non-skid footwear, turning lights on when up at night, removing throw rugs, keeping pathways clear, and making slow positional changes.  Advised on safe toilet transfer technique and for pt to work towards planting R foot gradually in line with the L foot over the next week to promote R knee flexion during transfers.   Overall, pt performing Adls and functional mobility with min guard-supv using RW.  No OT needs upon d/c.  Pt in agreement with plan.      If plan is discharge home, recommend the following: A little help with walking and/or transfers;A little help with bathing/dressing/bathroom;Assistance with cooking/housework;Assist for transportation;Help with stairs or ramp for entrance    Functional Status Assessment  Patient has had a recent decline in their functional status and demonstrates the ability to make significant improvements in function in a reasonable and  predictable amount of time.  Equipment Recommendations  BSC/3in1    Recommendations for Other Services       Precautions / Restrictions Precautions Precautions: Fall;Knee Precaution Booklet Issued: Yes (comment) Precaution Comments: HEP packet issued Restrictions Weight Bearing Restrictions: Yes RLE Weight Bearing: Weight bearing as tolerated      Mobility Bed Mobility               General bed mobility comments: N/T pt found in recliner Patient Response: Cooperative  Transfers Overall transfer level: Needs assistance Equipment used: Rolling walker (2 wheels) Transfers: Sit to/from Stand             General transfer comment: supv for sit to stand from 3in1 over toilet using RW for support upon standing      Balance Overall balance assessment: Needs assistance Sitting-balance support: Feet supported Sitting balance-Leahy Scale: Normal     Standing balance support: During functional activity, Reliant on assistive device for balance Standing balance-Leahy Scale: Fair Standing balance comment: able to perform standing ADLs with 1 hand on RW for support, or briefly no UB support to wash hands with good stability.                           ADL either performed or assessed with clinical judgement   ADL Overall ADL's : Needs assistance/impaired Eating/Feeding: Independent   Grooming: Wash/dry hands;Standing;Supervision/safety               Lower Body Dressing: Supervision/safety;Set up Lower Body Dressing Details (indicate cue  type and reason): demonstrates sufficient ability to reach to BLEs for donning/doffing LB clothing Toilet Transfer: Supervision/safety;Ambulation;BSC/3in1;Rolling walker (2 wheels) Toilet Transfer Details (indicate cue type and reason): 3in1 over toilet; 3in1 already ordered for d/c Toileting- Clothing Manipulation and Hygiene: Supervision/safety       Functional mobility during ADLs: Supervision/safety;Rolling walker  (2 wheels)       Vision Baseline Vision/History: 1 Wears glasses Patient Visual Report: No change from baseline       Perception         Praxis         Pertinent Vitals/Pain Pain Assessment Pain Score: 7  Pain Location: R Knee Pain Descriptors / Indicators: Aching, Sore, Grimacing, Guarding Pain Intervention(s): Limited activity within patient's tolerance, Monitored during session, Ice applied     Extremity/Trunk Assessment Upper Extremity Assessment Upper Extremity Assessment: Overall WFL for tasks assessed   Lower Extremity Assessment Lower Extremity Assessment: Defer to PT evaluation RLE Deficits / Details: s/p R TKA   Cervical / Trunk Assessment Cervical / Trunk Assessment: Normal   Communication Communication Communication: No apparent difficulties   Cognition Arousal: Alert Behavior During Therapy: WFL for tasks assessed/performed Overall Cognitive Status: Within Functional Limits for tasks assessed                                       General Comments       Exercises Other Exercises Other Exercises: Educ on OT role, goals, poc; reinforced benefits of completing bilat ankle pumps hourly when awake to promote LE circulation. Other Exercises: Educ on stepping R foot out while descending to chair/commode to accommodate for R knee pain, but advised on planting foot closer and closer in line with L foot over the next week to promote R knee flexion.  Pt verbalized understanding of OT demo.   Shoulder Instructions      Home Living Family/patient expects to be discharged to:: Private residence Living Arrangements: Alone Available Help at Discharge: Family;Available 24 hours/day (DIL will be staying with pt for the next 4 days) Type of Home: House (split level) Home Access: Stairs to enter Entrance Stairs-Number of Steps: 5 Entrance Stairs-Rails: Right Home Layout: Two level Alternate Level Stairs-Number of Steps: Split level: 5 steps down  to main level, 5 steps up to upper level, bilateral hand rail access for both sets of stairs Alternate Level Stairs-Rails: Right;Left;Can reach both Bathroom Shower/Tub: Producer, television/film/video: Standard Bathroom Accessibility: Yes   Home Equipment: Cane - single point;Grab bars - tub/shower          Prior Functioning/Environment Prior Level of Function : Independent/Modified Independent             Mobility Comments: indep ADLs Comments: indep        OT Problem List: Decreased knowledge of use of DME or AE;Impaired balance (sitting and/or standing);Pain      OT Treatment/Interventions:      OT Goals(Current goals can be found in the care plan section) Acute Rehab OT Goals Patient Stated Goal: Home with DIL OT Goal Formulation: With patient Time For Goal Achievement: 01/29/23 Potential to Achieve Goals: Good  OT Frequency:                    AM-PAC OT "6 Clicks" Daily Activity     Outcome Measure Help from another person eating meals?: None Help from another person taking care  of personal grooming?: None Help from another person toileting, which includes using toliet, bedpan, or urinal?: A Little Help from another person bathing (including washing, rinsing, drying)?: A Little Help from another person to put on and taking off regular upper body clothing?: None Help from another person to put on and taking off regular lower body clothing?: A Little 6 Click Score: 21   End of Session Equipment Utilized During Treatment: Gait belt;Rolling walker (2 wheels) Nurse Communication: Mobility status  Activity Tolerance: Patient tolerated treatment well Patient left: in chair;with call bell/phone within reach  OT Visit Diagnosis: Other abnormalities of gait and mobility (R26.89);Muscle weakness (generalized) (M62.81);Pain Pain - Right/Left: Right Pain - part of body: Leg                Time: 1005-1037 OT Time Calculation (min): 32 min Charges:  OT  General Charges $OT Visit: 1 Visit OT Evaluation $OT Eval Moderate Complexity: 1 Mod OT Treatments $Self Care/Home Management : 8-22 mins Danelle Earthly, MS, OTR/L [place] Dials 01/15/2023, 10:54 AM

## 2023-01-15 NOTE — Progress Notes (Addendum)
Subjective: 1 Day Post-Op Procedure(s) (LRB): TOTAL KNEE ARTHROPLASTY (Right) Patient reports pain as 7/10 Patient is well, and has had no acute complaints or problems Denies any CP, SOB, ABD pain.  No headaches or vision changes.  No dizziness lightheadedness Patient noted to have some hypertension, history of hypertension on lisinopril 2.5 mg daily.  Baseline blood pressures 130s over 80s.  Lisinopril has not been given today with schedule to take at 10 AM. We will continue therapy today.  Plan is to go Home after hospital stay.  Objective: Vital signs in last 24 hours: Temp:  [97.1 F (36.2 C)-98.8 F (37.1 C)] 98.1 F (36.7 C) (08/23 0643) Pulse Rate:  [84-111] 96 (08/23 0643) Resp:  [12-27] 16 (08/23 0643) BP: (142-197)/(73-130) 155/89 (08/23 0643) SpO2:  [95 %-99 %] 98 % (08/23 0643) Weight:  [72.6 kg] 72.6 kg (08/22 1149)  Intake/Output from previous day: 08/22 0701 - 08/23 0700 In: 2554.4 [I.V.:1966.7; IV Piggyback:587.7] Out: 920 [Urine:870; Blood:50] Intake/Output this shift: No intake/output data recorded.  Recent Labs    01/15/23 0626  HGB 11.4*   Recent Labs    01/15/23 0626  WBC 18.4*  RBC 3.78*  HCT 33.0*  PLT 306   Recent Labs    01/15/23 0626  NA 138  K 4.2  CL 104  CO2 22  BUN 14  CREATININE 0.62  GLUCOSE 219*  CALCIUM 9.2   No results for input(s): "LABPT", "INR" in the last 72 hours.  EXAM General - Patient is Alert, Appropriate, and Oriented Extremity - Neurovascular intact Sensation intact distally Intact pulses distally Dorsiflexion/Plantar flexion intact No cellulitis present Compartment soft Dressing - dressing C/D/I and no drainage Motor Function - intact, moving foot and toes well on exam.   Past Medical History:  Diagnosis Date   Allergy    Anxiety    Arthritis    Breast cancer (HCC)    Breast cancer, left (HCC) 2008   Diabetes mellitus without complication (HCC)    Fibromyalgia    GERD (gastroesophageal  reflux disease)    History of hepatitis C    reversed with medication   Hyperlipidemia    Hypertension    NASH (nonalcoholic steatohepatitis)    Personal history of radiation therapy 2008   Left Breast Cancer    Assessment/Plan:   1 Day Post-Op Procedure(s) (LRB): TOTAL KNEE ARTHROPLASTY (Right) Principal Problem:   S/P TKR (total knee replacement) using cement, right  Estimated body mass index is 27.46 kg/m as calculated from the following:   Height as of this encounter: 5\' 4"  (1.626 m).   Weight as of this encounter: 72.6 kg. Advance diet Up with therapy Labs are stable  Vital signs are stable  Pain 7 out of 10, continue with current pain regimen.  As needed oxycodone as ordered.  I suspect slight elevation in baseline blood pressure secondary to pain.  Lisinopril 2.5 mg scheduled to take at 10 AM.  Recommend patient monitor blood pressure twice daily at home and record readings and if elevated pressures recommend discussing with PCP.  Patient has been doing well, mobile this morning.  Anticipate completion of physical therapy goals today and discharge to home with home health PT today.  DVT Prophylaxis - Lovenox, TED hose, and SCDs Weight-Bearing as tolerated to right leg   T. Cranston Neighbor, PA-C [facility] 01/15/2023, 8:25 AM   Patient seen and examined, agree with above plan.  The patient is doing well status post right total knee arthroplasty,  no concerns at this time.  Pain is controlled.  Discussed DVT prophylaxis, pain medication use, and safe transition to home.  All questions answered the patient agrees with above plan will go home after clears PT.   Reinaldo Berber MD

## 2023-01-20 ENCOUNTER — Ambulatory Visit: Payer: Medicare HMO | Admitting: Internal Medicine

## 2023-01-23 ENCOUNTER — Other Ambulatory Visit: Payer: Self-pay | Admitting: Student in an Organized Health Care Education/Training Program

## 2023-01-23 DIAGNOSIS — M23203 Derangement of unspecified medial meniscus due to old tear or injury, right knee: Secondary | ICD-10-CM

## 2023-01-23 DIAGNOSIS — M1711 Unilateral primary osteoarthritis, right knee: Secondary | ICD-10-CM

## 2023-02-02 ENCOUNTER — Telehealth: Payer: Self-pay | Admitting: Family Medicine

## 2023-02-02 NOTE — Telephone Encounter (Signed)
Copied from CRM [id number]. Topic: Medicare AWV >> Feb 02, 2023  9:28 AM Payton Doughty wrote: Reason for CRM: LM 02/02/2023 to schedule AWV   Verlee Rossetti; Care Guide Ambulatory Clinical Support [facility] Direct Dial: [phone]

## 2023-02-17 ENCOUNTER — Telehealth: Payer: Self-pay | Admitting: Family Medicine

## 2023-02-17 NOTE — Telephone Encounter (Signed)
Copied from CRM [id number]. Topic: Medicare AWV >> Feb 17, 2023  3:34 PM Payton Doughty wrote: Reason for CRM: LM 02/17/2023 to schedule AWV   Verlee Rossetti; Care Guide Ambulatory Clinical Support [facility] [facility] Direct Dial: [phone]

## 2023-03-30 ENCOUNTER — Ambulatory Visit
Admission: RE | Admit: 2023-03-30 | Discharge: 2023-03-30 | Disposition: A | Discharge status: Home / Self Care | Payer: Medicare HMO | Source: Ambulatory Visit | Attending: Family Medicine | Admitting: Family Medicine

## 2023-03-30 DIAGNOSIS — Z1231 Encounter for screening mammogram for malignant neoplasm of breast: Secondary | ICD-10-CM | POA: Insufficient documentation

## 2023-05-27 ENCOUNTER — Telehealth: Payer: Self-pay | Admitting: Internal Medicine

## 2023-05-27 NOTE — Telephone Encounter (Signed)
 PT has been scheduled and they are aware of the appointment.

## 2023-05-27 NOTE — Telephone Encounter (Signed)
 Patient needs to be scheduled for a CT scan before Follow up appointment with Dr.Kasa in February. Patient prefers afternoons. Please call and advise [phone]

## 2023-07-01 ENCOUNTER — Ambulatory Visit
Admission: RE | Admit: 2023-07-01 | Discharge: 2023-07-01 | Disposition: A | Discharge status: Home / Self Care | Payer: Medicare HMO | Source: Ambulatory Visit | Attending: Adult Health | Admitting: Adult Health

## 2023-07-01 DIAGNOSIS — R911 Solitary pulmonary nodule: Secondary | ICD-10-CM | POA: Diagnosis present

## 2023-07-05 ENCOUNTER — Encounter: Payer: Self-pay | Admitting: Internal Medicine

## 2023-07-05 ENCOUNTER — Ambulatory Visit: Payer: Medicare HMO | Admitting: Internal Medicine

## 2023-07-05 VITALS — BP 122/82 | HR 80 | Temp 96.9°F | Ht 64.0 in | Wt 149.6 lb

## 2023-07-05 DIAGNOSIS — R9389 Abnormal findings on diagnostic imaging of other specified body structures: Secondary | ICD-10-CM

## 2023-07-05 DIAGNOSIS — R918 Other nonspecific abnormal finding of lung field: Secondary | ICD-10-CM

## 2023-07-05 NOTE — Progress Notes (Signed)
 [facility] [place] [facility] Consultation      Date: 07/05/2023,   MRN# [id number] Sharon Kaufman 1950-10-23  SYNOPSIS OF CT SCANS Patient underwent MRI of the liver to assess for nonalcoholic steatosis of the liver Incidental finding in the right lower lobe noted Patient has a 1.5 cm nodular opacification in the right lower lobe At the time of scan, patient may have had symptoms of sinus infection and was given antibiotics CT chest February 2024 Follow Up CT CHEST 08/2022 showed 1.5 cm nodular opacification in the right lower lobe which has not significantly changed Follow-up PET scan May 2024 Very mild uptake in the right lower lobe opacity SUV 2.9 postinfectious inflammatory is most likely cause CT chest 06/2023 No significant changes in nodular opacity  CHIEF COMPLAINT:   Assessment for abnormal CT scan   HISTORY OF PRESENT ILLNESS  Abnormal CT chest assessment Over the last 3 CT scans the right lower lobe nodular opacification has not changed in size or shape Findings are suggestive of benign etiology   No evidence of heart failure at this time No evidence or signs of infection at this time No respiratory distress No fevers, chills, nausea, vomiting, diarrhea No evidence of lower extremity edema No evidence hemoptysis   PAST MEDICAL HISTORY   Past Medical History:  Diagnosis Date   Allergy    Anxiety    Arthritis    Breast cancer (HCC)    Breast cancer, left (HCC) 2008   Diabetes mellitus without complication (HCC)    Fibromyalgia    GERD (gastroesophageal reflux disease)    History of hepatitis C    reversed with medication   Hyperlipidemia    Hypertension    NASH (nonalcoholic steatohepatitis)    Personal history of radiation therapy 2008   Left Breast Cancer     SURGICAL HISTORY   Past Surgical History:  Procedure Laterality Date   ANKLE FUSION Left 2009   APPENDECTOMY  2009   BREAST LUMPECTOMY Left 2008   CATARACT EXTRACTION, BILATERAL      COLONOSCOPY     ESOPHAGOGASTRODUODENOSCOPY     REDUCTION MAMMAPLASTY Right 2014   ROTATOR CUFF REPAIR Left 2011   SPLENECTOMY  1973   TONSILLECTOMY     TOTAL KNEE ARTHROPLASTY Right 01/14/2023   Procedure: TOTAL KNEE ARTHROPLASTY;  Surgeon: Venus Ginsberg, MD;  Location: [facility] ORS;  Service: Orthopedics;  Laterality: Right;   TOTAL SHOULDER REPLACEMENT Right 2019     FAMILY HISTORY   Family History  Problem Relation Age of Onset   Rheum arthritis Mother    Alcohol  abuse Father    Liver disease Father      SOCIAL HISTORY   Social History   Tobacco Use   Smoking status: Former    Current packs/day: 0.00    Types: Cigarettes    Quit date: 2008    Years since quitting: 17.1   Smokeless tobacco: Never  Vaping Use   Vaping status: Never Used  Substance Use Topics   Alcohol  use: Yes    Alcohol /week: 2.0 standard drinks of alcohol     Types: 2 Glasses of wine per week   Drug use: Never     MEDICATIONS    Home Medication:  Current Outpatient Rx   Order #: [id number] Class: Historical Med   Order #: [id number] Class: Normal   Order #: [id number] Class: Normal   Order #: [id number] Class: Historical Med   Order #: [id number] Class: Historical Med   Order #: [id number] Class: Historical Med   Order #:  [id number] Class: Historical Med   Order #: [id number] Class: Normal   Order #: [id number] Class: Normal   Order #: [id number] Class: Historical Med   Order #: [id number] Class: Historical Med   Order #: [id number] Class: Historical Med   Order #: [id number] Class: Historical Med   Order #: [id number] Class: No Print   Order #: [id number] Class: Historical Med   Order #: [id number] Class: Historical Med   Order #: [id number] Class: Historical Med   Order #: [id number] Class: Normal   Order #: [id number] Class: Normal   Order #: [id number] Class: Normal   Order #: [id number] Class: Normal   Order #: [id number] Class: Historical Med   Order #: [id number] Class: Normal   Order #: [id number] Class: Historical Med     Current Medication:  Current Outpatient Medications:    Accu-Chek FastClix Lancets MISC, 1 each by Other route daily., Disp: , Rfl:    ACCU-CHEK GUIDE test strip, USE 1 DAILY., Disp: 100 strip, Rfl: 1   acetaminophen  (TYLENOL ) 500 MG tablet, Take 2 tablets (1,000 mg total) by mouth every 8 (eight) hours., Disp: 30 tablet, Rfl: 0   Alcaftadine (LASTACAFT) 0.25 % SOLN, Apply 1-2 drops to eye 2 (two) times daily as needed., Disp: , Rfl:    Artificial Tear Solution (SOOTHE XP XTRA PROTECTION) SOLN, Apply 2 drops to eye daily as needed (both eyes)., Disp: , Rfl:    Biotin 10 MG CAPS, Take 10 mg by mouth daily., Disp: , Rfl:    CALCIUM CARBONATE ANTACID PO, Take 2 tablets by mouth at bedtime as needed., Disp: , Rfl:    docusate sodium  (COLACE) 100 MG capsule, Take 1 capsule (100 mg total) by mouth 2 (two) times daily., Disp: 10 capsule, Rfl: 0   enoxaparin  (LOVENOX ) 40 MG/0.4ML injection, Inject 0.4 mLs (40 mg total) into the skin daily for 14 days., Disp: 5.6 mL, Rfl: 0   Famotidine (PEPCID PO), Take 20 mg by mouth daily., Disp: , Rfl:    fluticasone (FLONASE) 50 MCG/ACT nasal spray, Place 1 spray into both nostrils 2 (two) times daily as needed for allergies or rhinitis., Disp: , Rfl:    Glycerin 0.25 % SOLN, Apply 1 drop to eye daily as needed (both eyes)., Disp: , Rfl:    ketotifen (ZADITOR) 0.035 % ophthalmic solution, Place 1 drop into both eyes 2 (two) times daily as needed., Disp: , Rfl:    lisinopril  (ZESTRIL ) 2.5 MG tablet, Take 1 tablet (2.5 mg total) by mouth daily., Disp: 100 tablet, Rfl: 0   LORazepam (ATIVAN) 2 MG tablet, Take 2 mg by mouth daily as needed. (Patient not taking: Reported on 01/05/2023), Disp: , Rfl:    LYSINE PO, Take 1,000 mg by mouth daily., Disp: , Rfl:    MAGNESIUM  PO, Take 550 mg by mouth daily., Disp: , Rfl:    metFORMIN  (GLUCOPHAGE -XR) 750 MG 24 hr tablet, TAKE 2 TABLETS EVERY DAY WITH BREAKFAST (Patient taking differently: 750 mg daily with breakfast.), Disp:  180 tablet, Rfl: 0   methocarbamol  (ROBAXIN ) 500 MG tablet, Take 1-2 tablets (500-1,000 mg total) by mouth every 8 (eight) hours as needed for muscle spasms., Disp: 90 tablet, Rfl: 0   ondansetron  (ZOFRAN ) 4 MG tablet, Take 1 tablet (4 mg total) by mouth every 6 (six) hours as needed for nausea., Disp: 20 tablet, Rfl: 0   oxyCODONE  (OXY IR/ROXICODONE ) 5 MG immediate release tablet, Take 0.5-1 tablets (2.5-5 mg total) by mouth every 6 (six) hours as needed for severe pain or breakthrough pain., Disp: 20 tablet, Rfl: 0   Semaglutide,0.25 or 0.5MG /DOS, 2  MG/3ML SOPN, Inject 1 mg into the skin once a week., Disp: , Rfl:    traMADol  (ULTRAM ) 50 MG tablet, Take 1 tablet (50 mg total) by mouth every 6 (six) hours as needed for moderate pain., Disp: 30 tablet, Rfl: 0   zolpidem  (AMBIEN ) 5 MG tablet, Take 5 mg by mouth at bedtime as needed for sleep. Psych, Disp: , Rfl:     ALLERGIES   Quetiapine, Alendronate , Atorvastatin, Codeine , Pravastatin, Risedronate, Hydrocodone-acetaminophen , and Amitriptyline      BP 122/82 (BP Location: Right Arm, Cuff Size: Normal)   Pulse 80   Temp (!) 96.9 F (36.1 C)   Ht 5\' 4"  (1.626 m)   Wt 149 lb 9.6 oz (67.9 kg)   SpO2 98%   BMI 25.68 kg/m      Review of Systems: Gen:  Denies  fever, sweats, chills weight loss  HEENT: Denies blurred vision, double vision, ear pain, eye pain, hearing loss, nose bleeds, sore throat Cardiac:  No dizziness, chest pain or heaviness, chest tightness,edema, No JVD Resp:   No cough, -sputum production, -shortness of breath,-wheezing, -hemoptysis,  Other:  All other systems negative   Physical Examination:   General Appearance: No distress  EYES PERRLA, EOM intact.   NECK Supple, No JVD Pulmonary: normal breath sounds, No wheezing.  CardiovascularNormal S1,S2.  No m/r/g.   Abdomen: Benign, Soft, non-tender. Neurology UE/LE 5/5 strength, no focal deficits Ext pulses intact, cap refill intact ALL OTHER ROS ARE  NEGATIVE  CT scans of the chest reviewed in detail extensively with patient The abnormal finding has not changed over the last 1 year Findings to suggest benign etiology however will need repeat CT chest in 1 year       ASSESSMENT/PLAN    73 year old pleasant white female with abnormal CT chest with a right lower lobe nodular opacity measuring 1.5 cm there could be signs of some air bronchograms signs going through the opacity  CT chest from February 2024 and April 2024 and PET scan May 2024 all reviewed with patient in detail, follow-up CT chest 1 year after the initial CT scan does not show significant changes of the right lower lobe opacification   At this time it is safe to proceed with follow-up CT chest in 1 year for interval progression Patient is satisfied with this plan of action   Avoid secondhand smoke Avoid SICK contacts Recommend  Masking  when appropriate Recommend Keep up-to-date with vaccinations     MEDICATION ADJUSTMENTS/LABS AND TESTS ORDERED: CT chest 1 year   CURRENT MEDICATIONS REVIEWED AT LENGTH WITH PATIENT TODAY   Patient  satisfied with Plan of action and management. All questions answered  Follow-up in 1 year  Total Time Spent  41 mins   Maile Linford Nestora Baptise, M.D.  [facility]  Medical Director [facility] Medical Director [facility] Cardio-Pulmonary Department

## 2023-07-05 NOTE — Patient Instructions (Signed)
 CT chest in one year  Avoid Allergens and Irritants Avoid secondhand smoke Avoid SICK contacts Recommend  Masking  when appropriate Recommend Keep up-to-date with vaccinations

## 2023-09-09 ENCOUNTER — Ambulatory Visit
Admission: RE | Admit: 2023-09-09 | Discharge: 2023-09-09 | Disposition: A | Discharge status: Home / Self Care | Source: Ambulatory Visit | Attending: Student in an Organized Health Care Education/Training Program | Admitting: Student in an Organized Health Care Education/Training Program

## 2023-09-09 ENCOUNTER — Ambulatory Visit: Admitting: Student in an Organized Health Care Education/Training Program

## 2023-09-09 ENCOUNTER — Encounter: Payer: Self-pay | Admitting: Student in an Organized Health Care Education/Training Program

## 2023-09-09 VITALS — BP 139/87 | HR 75 | Temp 97.2°F | Resp 16 | Ht 64.0 in | Wt 145.0 lb

## 2023-09-09 DIAGNOSIS — M75102 Unspecified rotator cuff tear or rupture of left shoulder, not specified as traumatic: Secondary | ICD-10-CM

## 2023-09-09 DIAGNOSIS — M25521 Pain in right elbow: Secondary | ICD-10-CM | POA: Insufficient documentation

## 2023-09-09 DIAGNOSIS — M542 Cervicalgia: Secondary | ICD-10-CM | POA: Insufficient documentation

## 2023-09-09 DIAGNOSIS — M12812 Other specific arthropathies, not elsewhere classified, left shoulder: Secondary | ICD-10-CM

## 2023-09-09 DIAGNOSIS — M5481 Occipital neuralgia: Secondary | ICD-10-CM

## 2023-09-09 DIAGNOSIS — M25512 Pain in left shoulder: Secondary | ICD-10-CM

## 2023-09-09 DIAGNOSIS — G8929 Other chronic pain: Secondary | ICD-10-CM | POA: Diagnosis not present

## 2023-09-09 NOTE — Patient Instructions (Signed)
 ______________________________________________________________________    General Risks and Possible Complications  Patient Responsibilities: It is important that you read this as it is part of your informed consent. It is our duty to inform you of the risks and possible complications associated with treatments offered to you. It is your responsibility as a patient to read this and to ask questions about anything that is not clear or that you believe was not covered in this document.  Patient's Rights: You have the right to refuse treatment. You also have the right to change your mind, even after initially having agreed to have the treatment done. However, under this last option, if you wait until the last second to change your mind, you may be charged for the materials used up to that point.  Introduction: Medicine is not an Visual merchandiser. Everything in Medicine, including the lack of treatment(s), carries the potential for danger, harm, or loss (which is by definition: Risk). In Medicine, a complication is a secondary problem, condition, or disease that can aggravate an already existing one. All treatments carry the risk of possible complications. The fact that a side effects or complications occurs, does not imply that the treatment was conducted incorrectly. It must be clearly understood that these can happen even when everything is done following the highest safety standards.  No treatment: You can choose not to proceed with the proposed treatment alternative. The "PRO(s)" would include: avoiding the risk of complications associated with the therapy. The "CON(s)" would include: not getting any of the treatment benefits. These benefits fall under one of three categories: diagnostic; therapeutic; and/or palliative. Diagnostic benefits include: getting information which can ultimately lead to improvement of the disease or symptom(s). Therapeutic benefits are those associated with the successful  treatment of the disease. Finally, palliative benefits are those related to the decrease of the primary symptoms, without necessarily curing the condition (example: decreasing the pain from a flare-up of a chronic condition, such as incurable terminal cancer).  General Risks and Complications: These are associated to most interventional treatments. They can occur alone, or in combination. They fall under one of the following six (6) categories: no benefit or worsening of symptoms; bleeding; infection; nerve damage; allergic reactions; and/or death. No benefits or worsening of symptoms: In Medicine there are no guarantees, only probabilities. No healthcare provider can ever guarantee that a medical treatment will work, they can only state the probability that it may. Furthermore, there is always the possibility that the condition may worsen, either directly, or indirectly, as a consequence of the treatment. Bleeding: This is more common if the patient is taking a blood thinner, either prescription or over the counter (example: Goody Powders, Fish oil, Aspirin, Garlic, etc.), or if suffering a condition associated with impaired coagulation (example: Hemophilia, cirrhosis of the liver, low platelet counts, etc.). However, even if you do not have one on these, it can still happen. If you have any of these conditions, or take one of these drugs, make sure to notify your treating physician. Infection: This is more common in patients with a compromised immune system, either due to disease (example: diabetes, cancer, human immunodeficiency virus [HIV], etc.), or due to medications or treatments (example: therapies used to treat cancer and rheumatological diseases). However, even if you do not have one on these, it can still happen. If you have any of these conditions, or take one of these drugs, make sure to notify your treating physician. Nerve Damage: This is more common when the treatment is  an invasive one, but it  can also happen with the use of medications, such as those used in the treatment of cancer. The damage can occur to small secondary nerves, or to large primary ones, such as those in the spinal cord and brain. This damage may be temporary or permanent and it may lead to impairments that can range from temporary numbness to permanent paralysis and/or brain death. Allergic Reactions: Any time a substance or material comes in contact with our body, there is the possibility of an allergic reaction. These can range from a mild skin rash (contact dermatitis) to a severe systemic reaction (anaphylactic reaction), which can result in death. Death: In general, any medical intervention can result in death, most of the time due to an unforeseen complication. ______________________________________________________________________      ______________________________________________________________________    Preparing for your procedure  Appointments: If you think you may not be able to keep your appointment, call 24-48 hours in advance to cancel. We need time to make it available to others.  Procedure visits are for procedures only. During your procedure appointment there will be: NO Prescription Refills*. NO medication changes or discussions*. NO discussion of disability issues*. NO unrelated pain problem evaluations*. NO evaluations to order other pain procedures*. *These will be addressed at a separate and distinct evaluation encounter on the provider's evaluation schedule and not during procedure days.  Instructions: Food intake: Avoid eating anything solid for at least 8 hours prior to your procedure. Clear liquid intake: You may take clear liquids such as water up to 2 hours prior to your procedure. (No carbonated drinks. No soda.) Transportation: Unless otherwise stated by your physician, bring a driver. (Driver cannot be a Market researcher, Pharmacist, community, or any other form of public transportation.) Morning  Medicines: Except for blood thinners, take all of your other morning medications with a sip of water. Make sure to take your heart and blood pressure medicines. If your blood pressure's lower number is above 100, the case will be rescheduled. Blood thinners: Make sure to stop your blood thinners as instructed.  If you take a blood thinner, but were not instructed to stop it, call our office [phone] and ask to talk to a nurse. Not stopping a blood thinner prior to certain procedures could lead to serious complications. Diabetics on insulin: Notify the staff so that you can be scheduled 1st case in the morning. If your diabetes requires high dose insulin, take only  of your normal insulin dose the morning of the procedure and notify the staff that you have done so. Preventing infections: Shower with an antibacterial soap the morning of your procedure.  Build-up your immune system: Take 1000 mg of Vitamin C with every meal (3 times a day) the day prior to your procedure. Antibiotics: Inform the nursing staff if you are taking any antibiotics or if you have any conditions that may require antibiotics prior to procedures. (Example: recent joint implants)   Pregnancy: If you are pregnant make sure to notify the nursing staff. Not doing so may result in injury to the fetus, including death.  Sickness: If you have a cold, fever, or any active infections, call and cancel or reschedule your procedure. Receiving steroids while having an infection may result in complications. Arrival: You must be in the facility at least 30 minutes prior to your scheduled procedure. Tardiness: Your scheduled time is also the cutoff time. If you do not arrive at least 15 minutes prior to your procedure, you will  be rescheduled.  Children: Do not bring any children with you. Make arrangements to keep them home. Dress appropriately: There is always a possibility that your clothing may get soiled. Avoid long dresses. Valuables:  Do not bring any jewelry or valuables.  Reasons to call and reschedule or cancel your procedure: (Following these recommendations will minimize the risk of a serious complication.) Surgeries: Avoid having procedures within 2 weeks of any surgery. (Avoid for 2 weeks before or after any surgery). Flu Shots: Avoid having procedures within 2 weeks of a flu shots or . (Avoid for 2 weeks before or after immunizations). Barium: Avoid having a procedure within 7-10 days after having had a radiological study involving the use of radiological contrast. (Myelograms, Barium swallow or enema study). Heart attacks: Avoid any elective procedures or surgeries for the initial 6 months after a "Myocardial Infarction" (Heart Attack). Blood thinners: It is imperative that you stop these medications before procedures. Let us know if you if you take any blood thinner.  Infection: Avoid procedures during or within two weeks of an infection (including chest colds or gastrointestinal problems). Symptoms associated with infections include: Localized redness, fever, chills, night sweats or profuse sweating, burning sensation when voiding, cough, congestion, stuffiness, runny nose, sore throat, diarrhea, nausea, vomiting, cold or Flu symptoms, recent or current infections. It is specially important if the infection is over the area that we intend to treat. Heart and lung problems: Symptoms that may suggest an active cardiopulmonary problem include: cough, chest pain, breathing difficulties or shortness of breath, dizziness, ankle swelling, uncontrolled high or unusually low blood pressure, and/or palpitations. If you are experiencing any of these symptoms, cancel your procedure and contact your primary care physician for an evaluation.  Remember:  Regular Business hours are:  Monday to Thursday 8:00 AM to 4:00 PM  Provider's Schedule: Delano Metz, MD:  Procedure days: Tuesday and Thursday 7:30 AM to 4:00 PM  Edward Jolly, MD:  Procedure days: Monday and Wednesday 7:30 AM to 4:00 PM Last  Updated: 05/04/2023 ______________________________________________________________________     Selective Nerve Root Block Patient Information  Description: Specific nerve roots exit the spinal canal and these nerves can be compressed and inflamed by a bulging disc and bone spurs.  By injecting steroids on the nerve root, we can potentially decrease the inflammation surrounding these nerves, which often leads to decreased pain.  Also, by injecting local anesthesia on the nerve root, this can provide [place] helpful information to give to your referring doctor if it decreases your pain.  Selective nerve root blocks can be done along the spine from the neck to the low back depending on the location of your pain.   After numbing the skin with local anesthesia, a small needle is passed to the nerve root and the position of the needle is verified using x-ray pictures.  After the needle is in correct position, we then deposit the medication.  You may experience a pressure sensation while this is being done.  The entire block usually lasts less than 15 minutes.  Conditions that may be treated with selective nerve root blocks: Low back and leg pain Spinal stenosis Diagnostic block prior to potential surgery Neck and arm pain Post laminectomy syndrome  Preparation for the injection:  Do not eat any solid food or dairy products within 8 hours of your appointment. You may drink clear liquids up to 3 hours before an appointment.  Clear liquids include water, black coffee, juice or soda.  No milk  or cream please. You may take your regular medications, including pain medications, with a sip of water before your appointment.  Diabetics should hold regular insulin (if taken separately) and take 1/2 normal NPH dose the morning of the procedure.  Carry some sugar containing items with you to your appointment. A driver must accompany you and be  prepared to drive you home after your procedure. Bring all your current medications with you. An IV may be inserted and sedation may be given at the discretion of the physician. A blood pressure cuff, EKG, and other monitors will often be applied during the procedure.  Some patients may need to have extra oxygen administered for a short period. You will be asked to provide medical information, including allergies, prior to the procedure.  We must know immediately if you are taking blood  Thinners (like Coumadin) or if you are allergic to IV iodine contrast (dye).  Possible side-effects: All are usually temporary Bleeding from needle site Light headedness Numbness and tingling Decreased blood pressure Weakness in arms/legs Pressure sensation in back/neck Pain at injection site (several days)  Possible complications: All are extremely rare Infection Nerve injury Spinal headache (a headache wore with upright position)  Call if you experience: Fever/chills associated with headache or increased back/neck pain Headache worsened by an upright position New onset weakness or numbness of an extremity below the injection site Hives or difficulty breathing (go to the emergency room) Inflammation or drainage at the injection site(s) Severe back/neck pain greater than usual New symptoms which are concerning to you  Please note:  Although the local anesthetic injected can often make your back or neck feel good for several hours after the injection the pain will likely return.  It takes 3-5 days for steroids to work on the nerve root. You may not notice any pain relief for at least one week.  If effective, we will often do a series of 3 injections spaced 3-6 weeks apart to maximally decrease your pain.    If you have any questions, please call [phone] [facility]

## 2023-09-09 NOTE — Progress Notes (Signed)
 PROVIDER NOTE: Interpretation of information contained herein should be left to medically-trained personnel. Specific patient instructions are provided elsewhere under "Patient Instructions" section of medical record. This document was created in part using AI and STT-dictation technology, any transcriptional errors that may result from this process are unintentional.  Patient: Sharon Kaufman  Service: E/M   PCP: Duanne Limerick, MD  DOB: 04-Aug-1950  DOS: 09/09/2023  Provider: Edward Jolly, MD  MRN: [id number]  Delivery: Face-to-face  Specialty: Interventional Pain Management  Type: Established Patient  Setting: Ambulatory outpatient facility  Specialty designation: 09  Referring Prov.: Duanne Limerick, MD  Location: Outpatient office facility       HPI  Sharon Kaufman, a 73 y.o. year old female, is here today because of her Chronic left shoulder pain [M25.512, G89.29]. Sharon Kaufman's primary complain today is Shoulder Pain (Left )  Pain Assessment: Severity of Chronic pain is reported as a 7 /10. Location: Shoulder (right elbow) Left/into left neck. Onset: More than a month ago. Quality: Discomfort, Sore, Throbbing, Aching, Constant, Headache (stiff). Timing: Constant. Modifying factor(s): ibuprofen/tylenol. Vitals:  height is 5\' 4"  (1.626 m) and weight is 145 lb (65.8 kg). Her temporal temperature is 97.2 F (36.2 C) (abnormal). Her blood pressure is 139/87 and her pulse is 75. Her respiration is 16 and oxygen saturation is 98%.  BMI: Estimated body mass index is 24.89 kg/m as calculated from the following:   Height as of this encounter: 5\' 4"  (1.626 m).   Weight as of this encounter: 145 lb (65.8 kg). Last encounter: 11/18/2022. Last procedure: 10/07/2022.  Reason for encounter: patient-requested evaluation.  Discussed the use of AI scribe software for clinical note transcription with the patient, who gave verbal consent to proceed.  History of Present Illness   Sharon Kaufman is a 73  year old female with chronic left shoulder pain and diabetes who presents with increased neck and shoulder pain.  She experiences increased pain in her neck and left shoulder, which radiates up to the back of her head and jaw, causing headaches. The pain worsens with neck movement and is localized from the neck to the shoulder and upwards, without radiation to her fingers. She has visited a chiropractor without relief. Her left shoulder pain is chronic, with a history of a left rotator cuff tear. She previously received a right suprascapular nerve block over a year ago, which provided significant relief for over six months.  She experiences occasional burning and tingling in her feet, particularly at night. She has a history of diabetes, which may contribute to these symptoms. She has tried gabapentin and Lyrica in the past without much benefit or due to side effects.  She mentions soreness in her knee, although this is not the primary focus of the visit. Additionally, her right elbow has been diagnosed with tendonitis based on a clinical exam, but no imaging has been done previously.   She is also on low-dose naltrexone for her fibromyalgia       ROS  Constitutional: Denies any fever or chills Gastrointestinal: No reported hemesis, hematochezia, vomiting, or acute GI distress Musculoskeletal:  Left shoulder pain, right elbow pain Neurological: No reported episodes of acute onset apraxia, aphasia, dysarthria, agnosia, amnesia, paralysis, loss of coordination, or loss of consciousness  Medication Review  Accu-Chek FastClix Lancets, Alcaftadine, Famotidine, Glycerin, LORazepam, Lysine, Magnesium, Naltrexone, Semaglutide(0.25 or 0.5MG /DOS), Soothe XP Xtra Protection, acetaminophen, cromolyn, docusate sodium, fluticasone, glucose blood, ketotifen, lisinopril, meloxicam, metFORMIN, and zolpidem  History  Review  Allergy: Sharon Kaufman is allergic to quetiapine, alendronate, atorvastatin, codeine,  pravastatin, risedronate, hydrocodone-acetaminophen, and amitriptyline. Drug: Sharon Kaufman  reports no history of drug use. Alcohol:  reports current alcohol use of about 2.0 standard drinks of alcohol per week. Tobacco:  reports that she quit smoking about 17 years ago. Her smoking use included cigarettes. She has never used smokeless tobacco. Social: Sharon Kaufman  reports that she quit smoking about 17 years ago. Her smoking use included cigarettes. She has never used smokeless tobacco. She reports current alcohol use of about 2.0 standard drinks of alcohol per week. She reports that she does not use drugs. Medical:  has a past medical history of Allergy, Anxiety, Arthritis, Breast cancer (HCC), Breast cancer, left (HCC) (2008), Diabetes mellitus without complication (HCC), Fibromyalgia, GERD (gastroesophageal reflux disease), History of hepatitis C, Hyperlipidemia, Hypertension, NASH (nonalcoholic steatohepatitis), and Personal history of radiation therapy (2008). Surgical: Sharon Kaufman  has a past surgical history that includes Splenectomy (1973); Appendectomy (2009); Breast lumpectomy (Left, 2008); Ankle Fusion (Left, 2009); Rotator cuff repair (Left, 2011); Total shoulder replacement (Right, 2019); Reduction mammaplasty (Right, 2014); Tonsillectomy; Cataract extraction, bilateral; Colonoscopy; Esophagogastroduodenoscopy; and Total knee arthroplasty (Right, 01/14/2023). Family: family history includes Alcohol abuse in her father; Liver disease in her father; Rheum arthritis in her mother.  Laboratory Chemistry Profile   Renal Lab Results  Component Value Date   BUN 14 01/15/2023   CREATININE 0.62 01/15/2023   BCR 30 (H) 04/28/2022   GFRNONAA >60 01/15/2023    Hepatic Lab Results  Component Value Date   AST 24 01/05/2023   ALT 21 01/05/2023   ALBUMIN 3.9 01/05/2023   ALKPHOS 47 01/05/2023    Electrolytes Lab Results  Component Value Date   NA 138 01/15/2023   K 4.2 01/15/2023   CL 104  01/15/2023   CALCIUM 9.2 01/15/2023   PHOS 2.7 (L) 12/02/2021    Bone Lab Results  Component Value Date   VD25OH 48.6 04/28/2022    Inflammation (CRP: Acute Phase) (ESR: Chronic Phase) No results found for: "CRP", "ESRSEDRATE", "LATICACIDVEN"       Note: Above Lab results reviewed.  Recent Imaging Review  CT Chest Wo Contrast CLINICAL DATA:  Abnormal xray - lung nodule, >= 1 cm.  EXAM: CT CHEST WITHOUT CONTRAST  TECHNIQUE: Multidetector CT imaging of the chest was performed following the standard protocol without IV contrast.  RADIATION DOSE REDUCTION: This exam was performed according to the departmental dose-optimization program which includes automated exposure control, adjustment of the mA and/or kV according to patient size and/or use of iterative reconstruction technique.  COMPARISON:  CT scan chest from 12/22/2022.  FINDINGS: Cardiovascular: Normal cardiac size. No pericardial effusion. No aortic aneurysm. There are mild peripheral atherosclerotic vascular calcifications of thoracic aorta and its major branches.  Mediastinum/Nodes: Visualized thyroid gland appears grossly unremarkable. No solid / cystic mediastinal masses. The esophagus is nondistended precluding optimal assessment. No mediastinal or axillary lymphadenopathy by size criteria. Evaluation of bilateral hila is limited due to lack on intravenous contrast: however, no large hilar lymphadenopathy identified.  Lungs/Pleura: The central tracheo-bronchial tree is patent. Redemonstration of heterogeneous opacity in the right lung lower lobe with associated bronchiectasis and linear areas of scarring/atelectasis without significant interval change. The solid component measures 1.0 x 1.7 cm, similar to the prior study. No new mass or consolidation. No pleural effusion or pneumothorax. Stable sub 4 mm linear opacity in the lingular segment of left upper lobe (series 100, image 66). No new  or suspicious  lung nodules.  Upper Abdomen: Cirrhotic liver configuration again seen. Redemonstration of approximately 2.9 x 4.3 cm exophytic lesion arising from the right hepatic lobe, segment 6, unchanged since multiple prior studies and better characterized on prior MRI abdomen from 06/01/2022. Remaining visualized upper abdominal viscera within normal limits.  Musculoskeletal: The visualized soft tissues of the chest wall are grossly unremarkable. No suspicious osseous lesions. There are moderate multilevel degenerative changes in the visualized spine. Redemonstration of mild anterior wedging deformity of T8 and T11 vertebrae, similar to the prior study. No significant retropulsion or spinal canal compromise. Right shoulder arthroplasty noted.  IMPRESSION: 1. Redemonstration of heterogeneous opacity in the right lung lower lobe with associated bronchiectasis and linear areas of scarring/atelectasis without significant interval change. 2. No new lung mass or consolidation. No lymphadenopathy. 3. Multiple other nonacute observations, as described above.  Aortic Atherosclerosis (ICD10-I70.0).  Electronically Signed   By: Beula Brunswick M.D.   On: 07/15/2023 19:11 Note: Reviewed        Physical Exam  General appearance: Well nourished, well developed, and well hydrated. In no apparent acute distress Mental status: Alert, oriented x 3 (person, place, & time)       Respiratory: No evidence of acute respiratory distress Eyes: PERLA Vitals: BP 139/87 (BP Location: Left Arm, Patient Position: Sitting, Cuff Size: Normal)   Pulse 75   Temp (!) 97.2 F (36.2 C) (Temporal)   Resp 16   Ht 5\' 4"  (1.626 m)   Wt 145 lb (65.8 kg)   SpO2 98%   BMI 24.89 kg/m  BMI: Estimated body mass index is 24.89 kg/m as calculated from the following:   Height as of this encounter: 5\' 4"  (1.626 m).   Weight as of this encounter: 145 lb (65.8 kg). Ideal: Ideal body weight: 54.7 kg (120 lb 9.5 oz) Adjusted ideal  body weight: 59.1 kg (130 lb 5.7 oz)  Cervicalgia with limited cervical range of motion  Occipital neuralgia  Left shoulder pain, worse with range of motion  Right elbow pain  Assessment   Diagnosis  1. Chronic left shoulder pain   2. Left rotator cuff tear arthropathy (hx of left shoulder surgery)   3. Cervicalgia   4. Bilateral occipital neuralgia   5. Right elbow pain      Updated Problems: Problem  Chronic Left Shoulder Pain    Plan of Care  Problem-specific:  Assessment and Plan    Chronic left shoulder pain due to rotator cuff tear   Chronic left shoulder pain is attributed to a rotator cuff tear. A suprascapular nerve block administered over a year ago provided significant relief for over six months. Plan to repeat the nerve block using a suprascapular approach with diazepam for sedation. If relief lasts less than three months, consider further interventions such as suprascapular nerve ablation or stimulation procedures.  Cervicalgia with possible occipital neuralgia   Neck pain has increased, radiating to the shoulder and jaw, causing headaches. Absence of pain radiating to the fingers suggests no radiculopathy. Differential diagnosis includes cervical spine issues or occipital neuralgia. Imaging is necessary to check for cervical spine nerve compression. If MRI results are normal, occipital neuralgia is likely.  Right elbow pain with suspected tendonitis   Right elbow pain is suspected to be tendonitis based on clinical examination. An x-ray is planned to further evaluate the condition. Plan for a right elbow injection pending insurance approval.  Diabetic neuropathy   Occasional burning and tingling in the feet, primarily  at night, are consistent with diabetic neuropathy. She was informed about Qutenza, a capsaicin-based treatment, but prefers to defer treatment as symptoms are not constant. Consider Qutenza if symptoms become constant.  Follow-up   Schedule a  follow-up appointment in 2-3 weeks for the left shoulder nerve block and right elbow injection. Advise her to contact the clinic if she does not hear about MRI scheduling within a week.   Of note, patient has done a home exercise program in the past for her left shoulder and her neck with limited benefit       [name] has a current medication list which includes the following long-term medication(s): famotidine, fluticasone, lisinopril, and metformin.  Pharmacotherapy (Medications Ordered): No orders of the defined types were placed in this encounter.  Orders:  Orders Placed This Encounter  Procedures   SUPRASCAPULAR NERVE BLOCK    For shoulder pain.    Standing Status:   Future    Expected Date:   10/09/2023    Expiration Date:   12/09/2023    Scheduling Instructions:     Purpose: LEFT, therapeutic     Level(s): Suprascapular notch     Sedation: valium.     Scheduling Timeframe: As permitted by the schedule    Where will this procedure be performed?:   [facility]   Medium Joint Injection/Arthrocentesis    Standing Status:   Future    Expected Date:   10/09/2023    Expiration Date:   12/09/2023    Scheduling Instructions:     Left elbow injection   DG Cervical Spine Complete    Patient presents with axial pain with possible radicular component. Please assist [place] in identifying specific level(s) and laterality of any additional findings such as: 1. Facet (Zygapophyseal) joint DJD (Hypertrophy, space narrowing, subchondral sclerosis, and/or osteophyte formation) 2. DDD and/or IVDD (Loss of disc height, desiccation, gas patterns, osteophytes, endplate sclerosis, or "Black disc disease") 3. Pars defects 4. Spondylolisthesis, spondylosis, and/or spondyloarthropathies (include Degree/Grade of displacement in mm) (stability) 5. Vertebral body Fractures (acute/chronic) (state percentage of collapse) 6. Demineralization (osteopenia/osteoporotic) 7. Bone pathology 8.  Foraminal narrowing  9. Surgical changes    Standing Status:   Future    Expected Date:   09/09/2023    Expiration Date:   12/09/2023    Scheduling Instructions:     Please make sure that the patient understands that this needs to be done as soon as possible. Never have the patient do the imaging "just before the next appointment". Inform patient that having the imaging done within the [facility] will expedite the availability of the results and will provide      imaging availability to the requesting physician. In addition inform the patient that the imaging order has an expiration date and will not be renewed if not done within the active period.    Reason for Exam (SYMPTOM  OR DIAGNOSIS REQUIRED):   Cervicalgia, chronic neck pain    Preferred imaging location?:   [facility]    Call Results- Best Contact Number?:   [phone] [place] [facility]   MR CERVICAL SPINE WO CONTRAST    Patient presents with axial pain with possible radicular component. Please assist [place] in identifying specific level(s) and laterality of any additional findings such as: 1. Facet (Zygapophyseal) joint DJD (Hypertrophy, space narrowing, subchondral sclerosis, and/or osteophyte formation) 2. DDD and/or IVDD (Loss of disc height, desiccation, gas patterns, osteophytes, endplate sclerosis, or "  Black disc disease") 3. Pars defects 4. Spondylolisthesis, spondylosis, and/or spondyloarthropathies (include Degree/Grade of displacement in mm) (stability) 5. Vertebral body Fractures (acute/chronic) (state percentage of collapse) 6. Demineralization (osteopenia/osteoporotic) 7. Bone pathology 8. Foraminal narrowing  9. Surgical changes 10. Central, Lateral Recess, and/or Foraminal Stenosis (include AP diameter of stenosis in mm) 11. Surgical changes (hardware type, status, and presence of fibrosis) 12. Modic Type Changes (MRI only) 13. IVDD (Disc bulge, protrusion,  herniation, extrusion) (Level, laterality, extent)    Standing Status:   Future    Expiration Date:   12/09/2023    Scheduling Instructions:     Please make sure that the patient understands that this needs to be done as soon as possible. Never have the patient do the imaging "just before the next appointment". Inform patient that having the imaging done within the [facility] will expedite the availability of the results and will provide      imaging availability to the requesting physician. In addition inform the patient that the imaging order has an expiration date and will not be renewed if not done within the active period.    What is the patient's sedation requirement?:   No Sedation    Does the patient have a pacemaker or implanted devices?:   No    Preferred imaging location?:   [facility] (table limit-350lbs)    Call Results- Best Contact Number?:   [phone] [place] [facility]    Radiology Contrast Protocol - do NOT remove file path:   \\[email or web address]   DG ELBOW COMPLETE RIGHT (3+VIEW)    Standing Status:   Future    Expiration Date:   12/09/2023    Scheduling Instructions:     Imaging must be done as soon as possible. Inform patient that order will expire within 30 days and I will not renew it.    Reason for Exam (SYMPTOM  OR DIAGNOSIS REQUIRED):   right elbow pain, concern for tendonitis    Preferred imaging location?:   [facility]    Call Results- Best Contact Number?:   [phone] [place] [facility]    Radiology Contrast Protocol - do NOT remove file path:   \\[email or web address]    Release to patient:   Immediate   Follow-up plan:   Return in about 20 days (around 09/29/2023) for Left SSNB + Right elbow injection (review C-MRI), in clinic (PO Valium 5mg ).     Bilateral genicular nerve block, left  suprascapular nerve block, Bilateral GN RFA 10/07/22       Recent Visits No visits were found meeting these conditions. Showing recent visits within past 90 days and meeting all other requirements Today's Visits Date Type Provider Dept  09/09/23 Office Visit Edward Jolly, MD [facility]  Showing today's visits and meeting all other requirements Future Appointments No visits were found meeting these conditions. Showing future appointments within next 90 days and meeting all other requirements  I discussed the assessment and treatment plan with the patient. The patient was provided an opportunity to ask questions and all were answered. The patient agreed with the plan and demonstrated an understanding of the instructions.  Patient advised to call back or seek an in-person evaluation if the symptoms or condition worsens.  Duration of encounter: .  Total time on encounter, as per AMA guidelines included both the face-to-face and non-face-to-face time personally spent by the physician and/or other qualified health care professional(s) on the  day of the encounter (includes time in activities that require the physician or other qualified health care professional and does not include time in activities normally performed by clinical staff). Physician's time may include the following activities when performed: Preparing to see the patient (e.g., pre-charting review of records, searching for previously ordered imaging, lab work, and nerve conduction tests) Review of prior analgesic pharmacotherapies. Reviewing PMP Interpreting ordered tests (e.g., lab work, imaging, nerve conduction tests) Performing post-procedure evaluations, including interpretation of diagnostic procedures Obtaining and/or reviewing separately obtained history Performing a medically appropriate examination and/or evaluation Counseling and educating the patient/family/caregiver Ordering medications, tests,  or procedures Referring and communicating with other health care professionals (when not separately reported) Documenting clinical information in the electronic or other health record Independently interpreting results (not separately reported) and communicating results to the patient/ family/caregiver Care coordination (not separately reported)  Note by: Cephus Collin, MD (TTS and AI technology used. I apologize for any typographical errors that were not detected and corrected.) Date: 09/09/2023; Time: 9:17 AM

## 2023-09-09 NOTE — Progress Notes (Signed)
 Safety precautions to be maintained throughout the outpatient stay will include: orient to surroundings, keep bed in low position, maintain call bell within reach at all times, provide assistance with transfer out of bed and ambulation.

## 2023-09-13 LAB — HM DIABETES EYE EXAM

## 2023-09-16 ENCOUNTER — Ambulatory Visit
Attending: Student in an Organized Health Care Education/Training Program | Admitting: Student in an Organized Health Care Education/Training Program

## 2023-09-16 ENCOUNTER — Other Ambulatory Visit: Payer: Self-pay

## 2023-09-16 ENCOUNTER — Encounter: Payer: Self-pay | Admitting: Student in an Organized Health Care Education/Training Program

## 2023-09-16 VITALS — BP 145/82 | HR 71 | Temp 97.3°F | Resp 16 | Ht 64.0 in | Wt 145.0 lb

## 2023-09-16 DIAGNOSIS — G8929 Other chronic pain: Secondary | ICD-10-CM

## 2023-09-16 DIAGNOSIS — M19021 Primary osteoarthritis, right elbow: Secondary | ICD-10-CM | POA: Diagnosis present

## 2023-09-16 DIAGNOSIS — M25512 Pain in left shoulder: Secondary | ICD-10-CM | POA: Insufficient documentation

## 2023-09-16 DIAGNOSIS — M7711 Lateral epicondylitis, right elbow: Secondary | ICD-10-CM

## 2023-09-16 DIAGNOSIS — M12812 Other specific arthropathies, not elsewhere classified, left shoulder: Secondary | ICD-10-CM

## 2023-09-16 DIAGNOSIS — M75102 Unspecified rotator cuff tear or rupture of left shoulder, not specified as traumatic: Secondary | ICD-10-CM | POA: Diagnosis not present

## 2023-09-16 DIAGNOSIS — M542 Cervicalgia: Secondary | ICD-10-CM | POA: Diagnosis not present

## 2023-09-16 NOTE — Progress Notes (Signed)
 PROVIDER NOTE: Interpretation of information contained herein should be left to medically-trained personnel. Specific patient instructions are provided elsewhere under "Patient Instructions" section of medical record. This document was created in part using AI and STT-dictation technology, any transcriptional errors that may result from this process are unintentional.  Patient: Sharon Kaufman  Service: E/M   PCP: Clarise Crooks, MD  DOB: 1951-05-05  DOS: 09/16/2023  Provider: Cephus Collin, MD  MRN: [id number]  Delivery: Face-to-face  Specialty: Interventional Pain Management  Type: Established Patient  Setting: Ambulatory outpatient facility  Specialty designation: 09  Referring Prov.: Clarise Crooks, MD  Location: Outpatient office facility       HPI  Sharon Kaufman, a 73 y.o. year old female, is here today because of her Chronic left shoulder pain [M25.512, G89.29]. Ms. Lienhard primary complain today is Elbow Pain (right)   Pain Assessment: Severity of Chronic pain is reported as a 7 /10. Location: Elbow Right/ . Onset: More than a month ago. Quality: Sore, Aching. Timing: Intermittent. Modifying factor(s): resting of elbow, otc meds. Vitals:  height is 5\' 4"  (1.626 m) and weight is 145 lb (65.8 kg). Her temporal temperature is 97.3 F (36.3 C) (abnormal). Her blood pressure is 145/82 (abnormal) and her pulse is 71. Her respiration is 16 and oxygen saturation is 99%.  BMI: Estimated body mass index is 24.89 kg/m as calculated from the following:   Height as of this encounter: 5\' 4"  (1.626 m).   Weight as of this encounter: 145 lb (65.8 kg). Last encounter: 09/09/2023. Last procedure: 10/07/2022.  Reason for encounter:  Patient presents today to review her elbow x-ray and cervical spine x-ray which are detailed below.  She has a cervical MRI coming up later this month. She endorses left shoulder pain, bilateral cervical spine pain, and right elbow pain  ROS  Constitutional: Denies  any fever or chills Gastrointestinal: No reported hemesis, hematochezia, vomiting, or acute GI distress Musculoskeletal:  Right elbow pain, cervicalgia Neurological:  Left shoulder pain  Medication Review  Accu-Chek FastClix Lancets, Alcaftadine, Famotidine, Glycerin, LORazepam, Lysine, Magnesium , Naltrexone, Semaglutide(0.25 or 0.5MG /DOS), Soothe XP Xtra Protection, acetaminophen , cromolyn, docusate sodium , fluticasone, glucose blood, ketotifen, lisinopril , meloxicam , metFORMIN , and zolpidem   History Review  Allergy: Ms. Kimbell is allergic to quetiapine, alendronate , atorvastatin, codeine , pravastatin, risedronate, hydrocodone-acetaminophen , and amitriptyline . Drug: Ms. Lacek  reports no history of drug use. Alcohol :  reports current alcohol  use of about 2.0 standard drinks of alcohol  per week. Tobacco:  reports that she quit smoking about 17 years ago. Her smoking use included cigarettes. She has never used smokeless tobacco. Social: Ms. Pomplun  reports that she quit smoking about 17 years ago. Her smoking use included cigarettes. She has never used smokeless tobacco. She reports current alcohol  use of about 2.0 standard drinks of alcohol  per week. She reports that she does not use drugs. Medical:  has a past medical history of Allergy, Anxiety, Arthritis, Breast cancer (HCC), Breast cancer, left (HCC) (2008), Diabetes mellitus without complication (HCC), Fibromyalgia, GERD (gastroesophageal reflux disease), History of hepatitis C, Hyperlipidemia, Hypertension, NASH (nonalcoholic steatohepatitis), and Personal history of radiation therapy (2008). Surgical: Ms. Seppala  has a past surgical history that includes Splenectomy (1973); Appendectomy (2009); Breast lumpectomy (Left, 2008); Ankle Fusion (Left, 2009); Rotator cuff repair (Left, 2011); Total shoulder replacement (Right, 2019); Reduction mammaplasty (Right, 2014); Tonsillectomy; Cataract extraction, bilateral; Colonoscopy;  Esophagogastroduodenoscopy; and Total knee arthroplasty (Right, 01/14/2023). Family: family history includes Alcohol  abuse in her father; Liver disease in  her father; Rheum arthritis in her mother.  Laboratory Chemistry Profile   Renal Lab Results  Component Value Date   BUN 14 01/15/2023   CREATININE 0.62 01/15/2023   BCR 30 (H) 04/28/2022   GFRNONAA >60 01/15/2023    Hepatic Lab Results  Component Value Date   AST 24 01/05/2023   ALT 21 01/05/2023   ALBUMIN 3.9 01/05/2023   ALKPHOS 47 01/05/2023    Electrolytes Lab Results  Component Value Date   NA 138 01/15/2023   K 4.2 01/15/2023   CL 104 01/15/2023   CALCIUM 9.2 01/15/2023   PHOS 2.7 (L) 12/02/2021    Bone Lab Results  Component Value Date   VD25OH 48.6 04/28/2022    Inflammation (CRP: Acute Phase) (ESR: Chronic Phase) No results found for: "CRP", "ESRSEDRATE", "LATICACIDVEN"       Note: Above Lab results reviewed.  Recent Imaging Review  DG Cervical Spine Complete CLINICAL DATA:  Cervicalgia. Chronic neck pain. Axial pain with passive radicular component.  EXAM: CERVICAL SPINE - COMPLETE 4+ VIEW  COMPARISON:  None Available.  FINDINGS: There is diffuse decreased bone mineralization. Mild dextrocurvature centered at C4.  The atlantodens interval is intact. There is 2 mm grade 1 anterolisthesis of C3 on C4.  Vertebral body heights are maintained.  Mild-to-moderate C4-5 through C6-7 and mild C7-T1 disc space narrowing. Mild anterior C4-5 through C7-T1 endplate osteophytes.  Uncovertebral and facet joint spurring contribute to mild left C2-3 and C5-6 and mild right C2-3 and C4-5 neuroforaminal narrowing.  Moderate atherosclerotic calcifications within the bilateral carotid bulbs. No prevertebral soft tissue swelling. The lung apices are clear. Moderate atherosclerotic calcifications within the aortic arch. Reverse total right shoulder arthroplasty hardware.  IMPRESSION: Mild-to-moderate C4-5  through C6-7 and mild C7-T1 degenerative disc and endplate changes.  Electronically Signed   By: Bertina Broccoli M.D.   On: 09/11/2023 19:03 DG ELBOW COMPLETE RIGHT (3+VIEW) CLINICAL DATA:  Right elbow pain.  Concern for tendinitis.  EXAM: RIGHT ELBOW - COMPLETE 3+ VIEW  COMPARISON:  None Available.  FINDINGS: Minimal medial elbow degenerative spurring at the peripheral aspect of the coronoid process and trochlea. Minimal chronic enthesopathic change at the common extensor tendon origin at the lateral epicondyle. No joint effusion. No acute fracture or dislocation.  IMPRESSION: 1. Minimal medial elbow osteoarthritis. 2. Minimal chronic enthesopathic change at the common extensor tendon origin at the lateral epicondyle.  Electronically Signed   By: Bertina Broccoli M.D.   On: 09/11/2023 19:00 Note: Reviewed        Physical Exam  General appearance: Well nourished, well developed, and well hydrated. In no apparent acute distress Mental status: Alert, oriented x 3 (person, place, & time)       Respiratory: No evidence of acute respiratory distress Eyes: PERLA Vitals: BP (!) 145/82 (Cuff Size: Normal)   Pulse 71   Temp (!) 97.3 F (36.3 C) (Temporal)   Resp 16   Ht 5\' 4"  (1.626 m)   Wt 145 lb (65.8 kg)   SpO2 99%   BMI 24.89 kg/m  BMI: Estimated body mass index is 24.89 kg/m as calculated from the following:   Height as of this encounter: 5\' 4"  (1.626 m).   Weight as of this encounter: 145 lb (65.8 kg). Ideal: Ideal body weight: 54.7 kg (120 lb 9.5 oz) Adjusted ideal body weight: 59.1 kg (130 lb 5.7 oz)  Left shoulder pain, worse with shoulder abduction  Cervicalgia and cervical spine pain  Right elbow pain  Assessment  Diagnosis Status  1. Chronic left shoulder pain   2. Left rotator cuff tear arthropathy (hx of left shoulder surgery)   3. Cervicalgia   4. Lateral epicondylitis of right elbow   5. Arthritis of right elbow    Having a Flare-up Having a  Flare-up Having a Flare-up   Updated Problems: No problems updated.  Plan of Care  Plan for right lateral epicondyle elbow injection for arthritis of the elbow joint and for lateral epicondylitis. Also plan for left suprascapular nerve block for rotator cuff dysfunction and suprascapular nerve dysfunction. I will review the patient's cervical MRI with her when she gets it completed later this month and discuss a treatment plan.  Orders:  Orders Placed This Encounter  Procedures   Medium Joint Injection/Arthrocentesis    Standing Status:   Future    Number of Occurrences:   1    Expiration Date:   12/16/2023    Scheduling Instructions:     Right lateral epicondyle steroid injection   SUPRASCAPULAR NERVE BLOCK    For shoulder pain.    Standing Status:   Future    Number of Occurrences:   1    Expected Date:   10/04/2023    Expiration Date:   12/16/2023    Scheduling Instructions:     Purpose: LEFT     Level(s): Suprascapular notch     Sedation: PO Valium      Scheduling Timeframe: As permitted by the schedule    Where will this procedure be performed?:   [facility]   Follow-up plan:   Return in about 18 days (around 10/04/2023) for right elbow injection (lateral epicondyle) and left SSNB, in clinic (PO Valium  5mg ).     Bilateral genicular nerve block, left suprascapular nerve block, Bilateral GN RFA 10/07/22   Recent Visits Date Type Provider Dept  09/09/23 Office Visit Cephus Collin, MD [facility]  Showing recent visits within past 90 days and meeting all other requirements Today's Visits Date Type Provider Dept  09/16/23 Office Visit Cephus Collin, MD [facility]  Showing today's visits and meeting all other requirements Future Appointments Date Type Provider Dept  10/06/23 Appointment Cephus Collin, MD [facility]  Showing future appointments within next 90 days and meeting all other requirements  I discussed the assessment and  treatment plan with the patient. The patient was provided an opportunity to ask questions and all were answered. The patient agreed with the plan and demonstrated an understanding of the instructions.  Patient advised to call back or seek an in-person evaluation if the symptoms or condition worsens.  Duration of encounter: .  Total time on encounter, as per AMA guidelines included both the face-to-face and non-face-to-face time personally spent by the physician and/or other qualified health care professional(s) on the day of the encounter (includes time in activities that require the physician or other qualified health care professional and does not include time in activities normally performed by clinical staff). Physician's time may include the following activities when performed: Preparing to see the patient (e.g., pre-charting review of records, searching for previously ordered imaging, lab work, and nerve conduction tests) Review of prior analgesic pharmacotherapies. Reviewing PMP Interpreting ordered tests (e.g., lab work, imaging, nerve conduction tests) Performing post-procedure evaluations, including interpretation of diagnostic procedures Obtaining and/or reviewing separately obtained history Performing a medically appropriate examination and/or evaluation Counseling and educating the patient/family/caregiver Ordering medications, tests, or procedures Referring and communicating with other health care professionals (when not  separately reported) Documenting clinical information in the electronic or other health record Independently interpreting results (not separately reported) and communicating results to the patient/ family/caregiver Care coordination (not separately reported)  Note by: Cephus Collin, MD (TTS and AI technology used. I apologize for any typographical errors that were not detected and corrected.) Date: 09/16/2023; Time: 4:08 PM

## 2023-09-16 NOTE — Patient Instructions (Signed)
 ______________________________________________________________________    Preparing for your procedure  Appointments: If you think you may not be able to keep your appointment, call 24-48 hours in advance to cancel. We need time to make it available to others.  Procedure visits are for procedures only. During your procedure appointment there will be: NO Prescription Refills*. NO medication changes or discussions*. NO discussion of disability issues*. NO unrelated pain problem evaluations*. NO evaluations to order other pain procedures*. *These will be addressed at a separate and distinct evaluation encounter on the provider's evaluation schedule and not during procedure days.  Instructions: Food intake: Avoid eating anything solid for at least 8 hours prior to your procedure. Clear liquid intake: You may take clear liquids such as water up to 2 hours prior to your procedure. (No carbonated drinks. No soda.) Transportation: Unless otherwise stated by your physician, bring a driver. (Driver cannot be a Market researcher, Pharmacist, community, or any other form of public transportation.) Morning Medicines: Except for blood thinners, take all of your other morning medications with a sip of water. Make sure to take your heart and blood pressure medicines. If your blood pressure's lower number is above 100, the case will be rescheduled. Blood thinners: Make sure to stop your blood thinners as instructed.  If you take a blood thinner, but were not instructed to stop it, call our office [phone] and ask to talk to a nurse. Not stopping a blood thinner prior to certain procedures could lead to serious complications. Diabetics on insulin : Notify the staff so that you can be scheduled 1st case in the morning. If your diabetes requires high dose insulin , take only  of your normal insulin  dose the morning of the procedure and notify the staff that you have done so. Preventing infections: Shower with an antibacterial soap the  morning of your procedure.  Build-up your immune system: Take 1000 mg of Vitamin C with every meal (3 times a day) the day prior to your procedure. Antibiotics: Inform the nursing staff if you are taking any antibiotics or if you have any conditions that may require antibiotics prior to procedures. (Example: recent joint implants)   Pregnancy: If you are pregnant make sure to notify the nursing staff. Not doing so may result in injury to the fetus, including death.  Sickness: If you have a cold, fever, or any active infections, call and cancel or reschedule your procedure. Receiving steroids while having an infection may result in complications. Arrival: You must be in the facility at least 30 minutes prior to your scheduled procedure. Tardiness: Your scheduled time is also the cutoff time. If you do not arrive at least 15 minutes prior to your procedure, you will be rescheduled.  Children: Do not bring any children with you. Make arrangements to keep them home. Dress appropriately: There is always a possibility that your clothing may get soiled. Avoid long dresses. Valuables: Do not bring any jewelry or valuables.  Reasons to call and reschedule or cancel your procedure: (Following these recommendations will minimize the risk of a serious complication.) Surgeries: Avoid having procedures within 2 weeks of any surgery. (Avoid for 2 weeks before or after any surgery). Flu Shots: Avoid having procedures within 2 weeks of a flu shots or . (Avoid for 2 weeks before or after immunizations). Barium: Avoid having a procedure within 7-10 days after having had a radiological study involving the use of radiological contrast. (Myelograms, Barium swallow or enema study). Heart attacks: Avoid any elective procedures or surgeries for the  initial 6 months after a "Myocardial Infarction" (Heart Attack). Blood thinners: It is imperative that you stop these medications before procedures. Let us  know if you if you take  any blood thinner.  Infection: Avoid procedures during or within two weeks of an infection (including chest colds or gastrointestinal problems). Symptoms associated with infections include: Localized redness, fever, chills, night sweats or profuse sweating, burning sensation when voiding, cough, congestion, stuffiness, runny nose, sore throat, diarrhea, nausea, vomiting, cold or Flu symptoms, recent or current infections. It is specially important if the infection is over the area that we intend to treat. Heart and lung problems: Symptoms that may suggest an active cardiopulmonary problem include: cough, chest pain, breathing difficulties or shortness of breath, dizziness, ankle swelling, uncontrolled high or unusually low blood pressure, and/or palpitations. If you are experiencing any of these symptoms, cancel your procedure and contact your primary care physician for an evaluation.  Remember:  Regular Business hours are:  Monday to Thursday 8:00 AM to 4:00 PM  Provider's Schedule: Renaldo Caroli, MD:  Procedure days: Tuesday and Thursday 7:30 AM to 4:00 PM  Cephus Collin, MD:  Procedure days: Monday and Wednesday 7:30 AM to 4:00 PM Last  Updated: 05/04/2023 ______________________________________________________________________    Celiac Plexus Block Patient Information  Description: The celiac plexus is a group of nerves which are part of the sympathetic nervous system.  These nerves supply organs in the abdomen and pelvis.  Specific organs supplied with sensation by the celiac plexus include the stomach, liver, gallbladder, pancreas, kidneys and part of the gut.   The celiac plexus is located on both sides of the aorta at approximately the level of the first lumbar vertebral body.  The block will be performed with you lying on your abdomen with a pillow underneath.  Using direct x-ray guidance, the celiac plexus will be located on both sides of the spine.  Numbing medicine will be used to  deaden the skin prior to needle insertion.  In most cases, a small amount of sedation can be given by IV prior to the numbing medicine.  Two small needles will be place near the celiac plexus and local anesthetic and steroid will be injected.  The entire block usually last about 15-25 minutes.  Conditions which may be treated by celiac plexus block:  Acute and chronic pancreatitis Pain from liver or pancreatic cancer Pain from Crohn's disease Other types of abdominal or flank pain  Preparation for the injection:  Do not eat any solid food or dairy products within 8 hours of your appointment. You may drink clear liquids up to 3 hours before appointment.  Clear liquids include water, black coffee, juice or soda.  No milk or cream please. You may take your regular medication, including pain medications, with a sip of water before your appointment.  Diabetics should hold regular insulin  (if taken separately) and take 1/2 normal NPH dose in the morning of the procedure.  Carry some sugar containing items with you to your appointment. A driver must accompany you and be prepared to drive you home after your procedure. Bring all your current medications with you. An IV may be inserted and sedation may be given at the discretion of the physician. A blood pressure cuff, EKG, and other monitors will often be applied during the procedure.  Some patients may need to have extra oxygen administered for a short period. You will be asked to provide medical information, including your allergies and medications, prior to the procedure.  We must know immediately if you are taking blood thinners (like Coumadin/Warfarin) or if you are allergic to IV iodine contrast (dye).  We must know if you could possible be pregnant.  Possible side-effects:  Bleeding from needle site or deeper Infection (rarre, can require surgery) Nerve injury (rare) Numbness & tingling (temporary) Collapsed lung (rare) Spinal headache ( a  headache worse with upright posture) Light-headedness (temporary) Pain at injection site (several days) Decreased blood pressure (temporary) Weakness in legs (temporary) Seizure or other drug reaction (rare)  Call if you experience:  Fever/chills associated with headache or increased back/neck pain Headache worsened by an upright position New onset weakness or numbness of an extremity below the injection site. Hives or difficulty breathing (go to the emergency room) Inflammation or drainage at the injection site. New onset diarrhea lasting more than 2 weeks. New symptoms which are concerning to you  Please note:  If effective, we will often do a series of 2-3 injections spaced 3-6 weeks apart to maximally decrease your pain.  If initial series is effective, you may be a candidate for a more permanent block of the celiac plexus. .  If you have questions, please call [phone] [facility]

## 2023-09-20 ENCOUNTER — Ambulatory Visit
Admission: RE | Admit: 2023-09-20 | Discharge: 2023-09-20 | Disposition: A | Discharge status: Home / Self Care | Source: Ambulatory Visit | Attending: Student in an Organized Health Care Education/Training Program | Admitting: Student in an Organized Health Care Education/Training Program

## 2023-09-20 DIAGNOSIS — M5481 Occipital neuralgia: Secondary | ICD-10-CM | POA: Insufficient documentation

## 2023-09-20 DIAGNOSIS — M542 Cervicalgia: Secondary | ICD-10-CM | POA: Insufficient documentation

## 2023-10-04 ENCOUNTER — Encounter: Payer: Self-pay | Admitting: Student in an Organized Health Care Education/Training Program

## 2023-10-04 ENCOUNTER — Ambulatory Visit
Attending: Student in an Organized Health Care Education/Training Program | Admitting: Student in an Organized Health Care Education/Training Program

## 2023-10-04 ENCOUNTER — Ambulatory Visit
Admission: RE | Admit: 2023-10-04 | Discharge: 2023-10-04 | Disposition: A | Discharge status: Home / Self Care | Source: Ambulatory Visit | Attending: Student in an Organized Health Care Education/Training Program | Admitting: Student in an Organized Health Care Education/Training Program

## 2023-10-04 VITALS — BP 160/88 | HR 76 | Temp 96.8°F | Resp 14 | Ht 63.0 in | Wt 145.0 lb

## 2023-10-04 DIAGNOSIS — M7711 Lateral epicondylitis, right elbow: Secondary | ICD-10-CM

## 2023-10-04 DIAGNOSIS — M12812 Other specific arthropathies, not elsewhere classified, left shoulder: Secondary | ICD-10-CM | POA: Diagnosis not present

## 2023-10-04 DIAGNOSIS — M47812 Spondylosis without myelopathy or radiculopathy, cervical region: Secondary | ICD-10-CM | POA: Diagnosis present

## 2023-10-04 DIAGNOSIS — M25512 Pain in left shoulder: Secondary | ICD-10-CM

## 2023-10-04 DIAGNOSIS — M75102 Unspecified rotator cuff tear or rupture of left shoulder, not specified as traumatic: Secondary | ICD-10-CM | POA: Insufficient documentation

## 2023-10-04 DIAGNOSIS — M19021 Primary osteoarthritis, right elbow: Secondary | ICD-10-CM | POA: Diagnosis present

## 2023-10-04 DIAGNOSIS — M542 Cervicalgia: Secondary | ICD-10-CM | POA: Insufficient documentation

## 2023-10-04 DIAGNOSIS — G8929 Other chronic pain: Secondary | ICD-10-CM | POA: Diagnosis present

## 2023-10-04 MED ORDER — LIDOCAINE HCL 2 % IJ SOLN
INTRAMUSCULAR | Status: AC
  Filled 2023-10-04: qty 20

## 2023-10-04 MED ORDER — DIAZEPAM 5 MG PO TABS
ORAL_TABLET | ORAL | Status: AC
  Filled 2023-10-04: qty 1

## 2023-10-04 MED ORDER — DEXAMETHASONE SODIUM PHOSPHATE 10 MG/ML IJ SOLN
10.0000 mg | Freq: Once | INTRAMUSCULAR | Status: AC
  Administered 2023-10-04: 10 mg

## 2023-10-04 MED ORDER — ROPIVACAINE HCL 2 MG/ML IJ SOLN
INTRAMUSCULAR | Status: AC
  Filled 2023-10-04: qty 20

## 2023-10-04 MED ORDER — IOHEXOL 180 MG/ML  SOLN
INTRAMUSCULAR | Status: AC
  Filled 2023-10-04: qty 20

## 2023-10-04 MED ORDER — DIAZEPAM 5 MG PO TABS
5.0000 mg | ORAL_TABLET | ORAL | Status: AC
  Administered 2023-10-04: 5 mg via ORAL

## 2023-10-04 MED ORDER — LIDOCAINE HCL 2 % IJ SOLN
20.0000 mL | Freq: Once | INTRAMUSCULAR | Status: AC
  Administered 2023-10-04: 400 mg

## 2023-10-04 MED ORDER — ROPIVACAINE HCL 2 MG/ML IJ SOLN
9.0000 mL | Freq: Once | INTRAMUSCULAR | Status: AC
  Administered 2023-10-04: 9 mL via PERINEURAL

## 2023-10-04 MED ORDER — IOHEXOL 180 MG/ML  SOLN
10.0000 mL | Freq: Once | INTRAMUSCULAR | Status: AC
  Administered 2023-10-04: 10 mL via INTRA_ARTICULAR

## 2023-10-04 MED ORDER — DEXAMETHASONE SODIUM PHOSPHATE 10 MG/ML IJ SOLN
INTRAMUSCULAR | Status: AC
  Filled 2023-10-04: qty 2

## 2023-10-04 NOTE — Progress Notes (Signed)
 PROVIDER NOTE: Interpretation of information contained herein should be left to medically-trained personnel. Specific patient instructions are provided elsewhere under "Patient Instructions" section of medical record. This document was created in part using STT-dictation technology, any transcriptional errors that may result from this process are unintentional.  Patient: Sharon Kaufman Type: Established DOB: 07-Mar-1951 MRN: [id number] PCP: Clarise Crooks, MD  Service: Procedure DOS: 10/04/2023 Setting: Ambulatory Location: Ambulatory outpatient facility Delivery: Face-to-face Provider: Cephus Collin, MD Specialty: Interventional Pain Management Specialty designation: 09 Location: Outpatient facility Ref. Prov.: Clarise Crooks, MD       Interventional Therapy   Primary Reason for Visit: Interventional Pain Management Treatment. CC: Elbow Pain (Right ) and Shoulder Pain (Left )    Procedure:          Anesthesia, Analgesia, Anxiolysis:  Type: Diagnostic Lateral Epicondyle Steroid Injection           Region: Posterolateral Elbow Area Level: Elbow Laterality: Right  Type: Local Anesthesia Local Anesthetic: Lidocaine  1-2% Sedation: Oral Anxiolysis  Indication(s):  Analgesia Route: Infiltration ([place]/IM) IV Access: N/A   Position: Prone   Lateral epicondylitis of right elbow NAS-11 Pain score:   Pre-procedure: 5 /10   Post-procedure: 0-No pain/10     H&P (Pre-op Assessment):  Sharon Kaufman is a 73 y.o. (year old), female patient, seen today for interventional treatment. 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 Sharon Kaufman has a current  medication list which includes the following prescription(s): accu-chek fastclix lancets, accu-chek guide, acetaminophen , lastacaft, soothe xp xtra protection, cromolyn, docusate sodium , famotidine, fluticasone, glycerin, ketotifen, lisinopril , lorazepam, lysine, magnesium , meloxicam , metformin , naltrexone, semaglutide(0.25 or 0.5mg /dos), and zolpidem . Her primarily concern today is the Elbow Pain (Right ) and Shoulder Pain (Left )  Initial Vital Signs:  Pulse/HCG Rate: 76ECG Heart Rate: 79 Temp: (!) 96.8 F (36 C) Resp: 16 BP: 137/75 SpO2: 100 %  BMI: Estimated body mass index is 25.69 kg/m as calculated from the following:   Height as of this encounter: 5\' 3"  (1.6 m).   Weight as of this encounter: 145 lb (65.8 kg).  Risk Assessment: Allergies: Reviewed. She is allergic to quetiapine, alendronate , atorvastatin, codeine , pravastatin, risedronate, hydrocodone-acetaminophen , and amitriptyline .  Allergy Precautions: None required Coagulopathies: Reviewed. None identified.  Blood-thinner therapy: None at this time Active Infection(s): Reviewed. None identified. Sharon Kaufman is afebrile  Site Confirmation: Sharon Kaufman was asked to confirm the procedure and laterality before marking the site Procedure checklist: Completed Consent: Before the procedure and under the influence of no sedative(s), amnesic(s), or anxiolytics, the patient was informed of the treatment options, risks and possible complications. To fulfill our ethical and legal obligations, as recommended by the American Medical Association's Code of Ethics, I have informed the patient of my clinical impression; the nature and purpose of the treatment or procedure; the risks, benefits, and possible complications of the intervention; the alternatives, including doing nothing; the risk(s) and benefit(s) of the alternative treatment(s) or procedure(s); and the risk(s) and benefit(s) of doing nothing. The patient was provided information  about the general risks and possible complications associated with the procedure. These may include, but are not limited to: failure to achieve desired goals, infection, bleeding, organ or nerve damage, allergic reactions, paralysis, and death. In addition, the patient was informed  of those risks and complications associated to the procedure, such as failure to decrease pain; infection; bleeding; organ or nerve damage with subsequent damage to sensory, motor, and/or autonomic systems, resulting in permanent pain, numbness, and/or weakness of one or several areas of the body; allergic reactions; (i.e.: anaphylactic reaction); and/or death. Furthermore, the patient was informed of those risks and complications associated with the medications. These include, but are not limited to: allergic reactions (i.e.: anaphylactic or anaphylactoid reaction(s)); adrenal axis suppression; blood sugar elevation that in diabetics may result in ketoacidosis or comma; water retention that in patients with history of congestive heart failure may result in shortness of breath, pulmonary edema, and decompensation with resultant heart failure; weight gain; swelling or edema; medication-induced neural toxicity; particulate matter embolism and blood vessel occlusion with resultant organ, and/or nervous system infarction; and/or aseptic necrosis of one or more joints. Finally, the patient was informed that Medicine is not an exact science; therefore, there is also the possibility of unforeseen or unpredictable risks and/or possible complications that may result in a catastrophic outcome. The patient indicated having understood very clearly. We have given the patient no guarantees and we have made no promises. Enough time was given to the patient to ask questions, all of which were answered to the patient's satisfaction. Sharon Kaufman has indicated that she wanted to continue with the procedure. Attestation: I, the ordering provider, attest  that I have discussed with the patient the benefits, risks, side-effects, alternatives, likelihood of achieving goals, and potential problems during recovery for the procedure that I have provided informed consent. Date  Time: 10/04/2023  1:54 PM  Pre-Procedure Preparation:  Monitoring: As per clinic protocol. Respiration, ETCO2, SpO2, BP, heart rate and rhythm monitor placed and checked for adequate function Safety Precautions: Patient was assessed for positional comfort and pressure points before starting the procedure. Time-out: I initiated and conducted the "Time-out" before starting the procedure, as per protocol. The patient was asked to participate by confirming the accuracy of the "Time Out" information. Verification of the correct person, site, and procedure were performed and confirmed by me, the nursing staff, and the patient. "Time-out" conducted as per Joint Commission's Universal Protocol (UP.01.01.01). Time: 1435 Start Time: 1436 hrs.  Description of Procedure:          Target Area: Lateral epicondyle Approach: Lateral approach. Area Prepped: Entire elbow Region ChloraPrep (2% chlorhexidine  gluconate and 70% isopropyl alcohol ) Safety Precautions: Aspiration looking for blood return was conducted prior to all injections. At no point did we inject any substances, as a needle was being advanced. No attempts were made at seeking any paresthesias. Safe injection practices and needle disposal techniques used. Medications properly checked for expiration dates. SDV (single dose vial) medications used. Description of the Procedure: Protocol guidelines were followed. The patient was placed in position. The target area was identified and the area prepped in the usual manner. Skin & deeper tissues infiltrated with local anesthetic. Appropriate amount of time allowed to pass for local anesthetics to take effect. The procedure needles were then advanced to the target area. Proper needle placement  secured. Negative aspiration confirmed. Solution injected in intermittent fashion, asking for systemic symptoms every 0.5cc of injectate. The needles were then removed and the area cleansed, making sure to leave some of the prepping solution back to take advantage of its long term bactericidal properties. Vitals:   10/04/23 1425 10/04/23 1430 10/04/23 1435 10/04/23 1440  BP: (!) 153/91 (!) 157/92 (!) 163/98 (!) 160/88  Pulse:  Resp: 12 13 14 14   Temp:      TempSrc:      SpO2: 97% 96%  96%  Weight:      Height:        Start Time: 1436 hrs. End Time: 1439 hrs. Materials:  Needle(s) Type: Regular needle Gauge: 25G Length: 1.5-in Medication(s): Please see orders for medications and dosing details.  5 cc solution made of 4 cc of 0.2% prilocaine, 1 cc of Decadron  10 mg/cc.  Injected along the lateral epicondyle                 Type of Imaging Technique: None used Indication(s): N/A Exposure Time: No patient exposure Contrast: None used. Fluoroscopic Guidance: N/A Ultrasound Guidance: N/A Interpretation: N/A  Antibiotic Prophylaxis:   Anti-infectives (From admission, onward)    None      Indication(s): None identified   Post-operative Assessment:  Post-procedure Vital Signs:  Pulse/HCG Rate: 7677 Temp: (!) 96.8 F (36 C) Resp: 14 BP: (!) 160/88 SpO2: 96 %  EBL: None  Complications: No immediate post-treatment complications observed by team, or reported by patient.  Note: The patient tolerated the entire procedure well. A repeat set of vitals were taken after the procedure and the patient was kept under observation following institutional policy, for this type of procedure. Post-procedural neurological assessment was performed, showing return to baseline, prior to discharge. The patient was provided with post-procedure discharge instructions, including a section on how to identify potential problems. Should any problems arise concerning this procedure, the  patient was given instructions to immediately contact us , at any time, without hesitation. In any case, we plan to contact the patient by telephone for a follow-up status report regarding this interventional procedure.  Comments:  No additional relevant information.  Plan of Care (POC)  Orders:  Orders Placed This Encounter  Procedures   CERVICAL FACET (MEDIAL BRANCH NERVE BLOCK)     Standing Status:   Future    Expected Date:   10/20/2023    Expiration Date:   01/04/2024    Scheduling Instructions:     Procedure: Cervical facet Block     Type: Medial Branch Block     Side: LEFT     Purpose: Diagnostic Radiologic Mapping     Level(s): C3-4, C4-5, C5-6,  by Fluoroscopic Pain Mapping Facet joints (C3, C4, C5, C6, Medial Branch Nerves)     Sedation: IV Versed      Timeframe: As soon as schedule allows.    Where will this procedure be performed?:   [facility]   [facility] C-ARM 1-60 MIN NO REPORT    Intraoperative interpretation by procedural physician at [facility].    Standing Status:   Standing    Number of Occurrences:   1    Reason for exam::   Assistance in needle guidance and placement for procedures requiring needle placement in or near specific anatomical locations not easily accessible without such assistance.   Medications ordered for procedure: Meds ordered this encounter  Medications   iohexol  (OMNIPAQUE ) 180 MG/ML injection 10 mL    Must be Myelogram-compatible. If not available, you may substitute with a water-soluble, non-ionic, hypoallergenic, myelogram-compatible radiological contrast medium.   lidocaine  (XYLOCAINE ) 2 % (with pres) injection 400 mg   diazepam  (VALIUM ) tablet 5 mg    Make sure Flumazenil is available in the pyxis when using this medication. If oversedation occurs, administer 0.2 mg IV over 15 sec. If after 45 sec no response, administer  0.2 mg again over 1 min; may repeat at 1 min intervals; not to exceed 4 doses (1 mg)    dexamethasone  (DECADRON ) injection 10 mg   dexamethasone  (DECADRON ) injection 10 mg   ropivacaine  (PF) 2 mg/mL (0.2%) (NAROPIN ) injection 9 mL   Medications administered: We administered iohexol , lidocaine , diazepam , dexamethasone , dexamethasone , and ropivacaine  (PF) 2 mg/mL (0.2%).  See the medical record for exact dosing, route, and time of administration.  Follow-up plan:   Return in about 10 days (around 10/14/2023) for PPE F2F.       Recent Visits Date Type Provider Dept  09/16/23 Office Visit Cephus Collin, MD [facility]  09/09/23 Office Visit Cephus Collin, MD [facility]  Showing recent visits within past 90 days and meeting all other requirements Today's Visits Date Type Provider Dept  10/04/23 Procedure visit Cephus Collin, MD [facility]  Showing today's visits and meeting all other requirements Future Appointments Date Type Provider Dept  10/14/23 Appointment Cephus Collin, MD [facility]  Showing future appointments within next 90 days and meeting all other requirements  Disposition: Discharge home  Discharge (Date  Time): 10/04/2023; 1454 hrs.   Primary Care Physician: Clarise Crooks, MD Location: [facility] Note by: Cephus Collin, MD (TTS technology used. I apologize for any typographical errors that were not detected and corrected.) Date: 10/04/2023; Time: 3:24 PM  Disclaimer:  Medicine is not an Visual merchandiser. The only guarantee in medicine is that nothing is guaranteed. It is important to note that the decision to proceed with this intervention was based on the information collected from the patient. The Data and conclusions were drawn from the patient's questionnaire, the interview, and the physical examination. Because the information was provided in large part by the patient, it cannot be guaranteed that it has not been purposely or unconsciously manipulated. Every effort has been made to  obtain as much relevant data as possible for this evaluation. It is important to note that the conclusions that lead to this procedure are derived in large part from the available data. Always take into account that the treatment will also be dependent on availability of resources and existing treatment guidelines, considered by other Pain Management Practitioners as being common knowledge and practice, at the time of the intervention. For Medico-Legal purposes, it is also important to point out that variation in procedural techniques and pharmacological choices are the acceptable norm. The indications, contraindications, technique, and results of the above procedure should only be interpreted and judged by a Board-Certified Interventional Pain Specialist with extensive familiarity and expertise in the same exact procedure and technique.

## 2023-10-04 NOTE — Patient Instructions (Signed)

## 2023-10-04 NOTE — Progress Notes (Signed)
 Safety precautions to be maintained throughout the outpatient stay will include: orient to surroundings, keep bed in low position, maintain call bell within reach at all times, provide assistance with transfer out of bed and ambulation.

## 2023-10-04 NOTE — Progress Notes (Signed)
 PROVIDER NOTE: Interpretation of information contained herein should be left to medically-trained personnel. Specific patient instructions are provided elsewhere under "Patient Instructions" section of medical record. This document was created in part using STT-dictation technology, any transcriptional errors that may result from this process are unintentional.  Patient: Sharon Kaufman Type: Established DOB: Aug 08, 1950 MRN: [id number] PCP: Sharon Crooks, MD  Service: Procedure DOS: 10/04/2023 Setting: Ambulatory Location: Ambulatory outpatient facility Delivery: Face-to-face Provider: Cephus Collin, MD Specialty: Interventional Pain Management Specialty designation: 09 Location: Outpatient facility Ref. Prov.: Sharon Crooks, MD       Interventional Therapy   Procedure: Suprascapular nerve block (SSNB) #1  Laterality:  Left  Level: Superior to scapular spine, lateral to supraspinatus fossa (Suprascapular notch).  Imaging: Fluoroscopic guidance         Anesthesia: Local anesthesia (1-2% Lidocaine ) Sedation: Minimal Sedation                       DOS: 10/04/2023  Performed by: Sharon Collin, MD  Purpose: Diagnostic/Therapeutic Indications: Shoulder pain, severe enough to impact quality of life and/or function.  Left shoulder pain, left suprascapular nerve dysfunction  NAS-11 score:   Pre-procedure: 5 /10   Post-procedure: 0-No pain/10     Target: Suprascapular nerve Location: midway between the medial border of the scapula and the acromion as it runs through the suprascapular notch. Region: Suprascapular, posterior shoulder  Approach: Percutaneous  Neuroanatomy: The suprascapular nerve is the lateral branch of the superior trunk of the brachial plexus. It receives nerve fibers that originate in the nerve roots C5 and C6 (and sometimes C4). It is a mixed nerve, meaning that it provides both sensory and motor supply for the suprascapular region. Function: The main function of this  nerve is to provide motor innervation for two muscles, the supraspinatus and infraspinatus muscles. They are part of the rotator cuff muscles. In addition, the suprascapular nerve provides a sensory supply to the joints of the scapula (glenohumeral and acromioclavicular joints). Rationale (medical necessity): procedure needed and proper for the diagnosis and/or treatment of the patient's medical symptoms and needs.  Position / Prep / Materials:  Position: Prone Materials:  Tray: Block Needle(s):  Type: Spinal  Gauge (G): 22  Length: 3.5 in.  Qty: 1 Prep solution: ChloraPrep (2% chlorhexidine  gluconate and 70% isopropyl alcohol ) Prep Area: Entire posterior shoulder area. From upper spine to shoulder proper (upper arm), and from lateral neck to lower tip of shoulder blade.   H&P (Pre-op Assessment):  Sharon Kaufman is a 73 y.o. (year old), female patient, seen today for interventional treatment. 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 Sharon Kaufman has a current medication list which includes the following prescription(s): accu-chek fastclix lancets, accu-chek guide, acetaminophen , lastacaft, soothe xp xtra protection, cromolyn, docusate sodium , famotidine, fluticasone, glycerin, ketotifen, lisinopril , lorazepam, lysine, magnesium , meloxicam , metformin , naltrexone, semaglutide(0.25 or 0.5mg /dos), and zolpidem . Her primarily concern today is the Elbow Pain (Right ) and Shoulder Pain (Left )  Initial Vital Signs:  Pulse/HCG Rate: 76ECG Heart Rate: 79 Temp: (!) 96.8 F (36 C) Resp: 16 BP: 137/75 SpO2: 100 %  BMI: Estimated body mass index is 25.69  kg/m as calculated from the following:   Height as of this encounter: 5\' 3"  (1.6  m).   Weight as of this encounter: 145 lb (65.8 kg).  Risk Assessment: Allergies: Reviewed. She is allergic to quetiapine, alendronate , atorvastatin, codeine , pravastatin, risedronate, hydrocodone-acetaminophen , and amitriptyline .  Allergy Precautions: None required Coagulopathies: Reviewed. None identified.  Blood-thinner therapy: None at this time Active Infection(s): Reviewed. None identified. Sharon Kaufman is afebrile  Site Confirmation: Sharon Kaufman was asked to confirm the procedure and laterality before marking the site Procedure checklist: Completed Consent: Before the procedure and under the influence of no sedative(s), amnesic(s), or anxiolytics, the patient was informed of the treatment options, risks and possible complications. To fulfill our ethical and legal obligations, as recommended by the American Medical Association's Code of Ethics, I have informed the patient of my clinical impression; the nature and purpose of the treatment or procedure; the risks, benefits, and possible complications of the intervention; the alternatives, including doing nothing; the risk(s) and benefit(s) of the alternative treatment(s) or procedure(s); and the risk(s) and benefit(s) of doing nothing. The patient was provided information about the general risks and possible complications associated with the procedure. These may include, but are not limited to: failure to achieve desired goals, infection, bleeding, organ or nerve damage, allergic reactions, paralysis, and death. In addition, the patient was informed of those risks and complications associated to the procedure, such as failure to decrease pain; infection; bleeding; organ or nerve damage with subsequent damage to sensory, motor, and/or autonomic systems, resulting in permanent pain, numbness, and/or weakness of one or several areas of the body; allergic reactions;  (i.e.: anaphylactic reaction); and/or death. Furthermore, the patient was informed of those risks and complications associated with the medications. These include, but are not limited to: allergic reactions (i.e.: anaphylactic or anaphylactoid reaction(s)); adrenal axis suppression; blood sugar elevation that in diabetics may result in ketoacidosis or comma; water retention that in patients with history of congestive heart failure may result in shortness of breath, pulmonary edema, and decompensation with resultant heart failure; weight gain; swelling or edema; medication-induced neural toxicity; particulate matter embolism and blood vessel occlusion with resultant organ, and/or nervous system infarction; and/or aseptic necrosis of one or more joints. Finally, the patient was informed that Medicine is not an exact science; therefore, there is also the possibility of unforeseen or unpredictable risks and/or possible complications that may result in a catastrophic outcome. The patient indicated having understood very clearly. We have given the patient no guarantees and we have made no promises. Enough time was given to the patient to ask questions, all of which were answered to the patient's satisfaction. Ms. Tilford has indicated that she wanted to continue with the procedure. Attestation: I, the ordering provider, attest that I have discussed with the patient the benefits, risks, side-effects, alternatives, likelihood of achieving goals, and potential problems during recovery for the procedure that I have provided informed consent. Date  Time: 10/04/2023  1:54 PM  Pre-Procedure Preparation:  Monitoring: As per clinic protocol. Respiration, ETCO2, SpO2, BP, heart rate and rhythm monitor placed and checked for adequate function Safety Precautions: Patient was assessed for positional comfort and pressure points before starting the procedure. Time-out: I initiated and conducted the "Time-out" before starting  the procedure, as per protocol. The patient was asked to participate by confirming the accuracy of the "Time Out" information. Verification of the correct person, site, and procedure were performed and confirmed by me, the nursing staff, and the patient. "Time-out" conducted as per Joint Commission's Universal Protocol (UP.01.01.01). Time: 1435 Start Time: 1436 hrs.  Description of Procedure:  Procedural Technique Safety Precautions: Aspiration looking for blood return was conducted prior to all injections. At no point did we inject any substances, as a needle was being advanced. No attempts were made at seeking any paresthesias. Safe injection practices and needle disposal techniques used. Medications properly checked for expiration dates. SDV (single dose vial) medications used. Description of the Procedure: Protocol guidelines were followed. The patient was placed in position over the procedure table. The target area was identified and the area prepped in the usual manner. Skin & deeper tissues infiltrated with local anesthetic. Appropriate amount of time allowed to pass for local anesthetics to take effect. The procedure needles were then advanced to the target area. Proper needle placement secured. Negative aspiration confirmed. Solution injected in intermittent fashion, asking for systemic symptoms every 0.5cc of injectate. The needles were then removed and the area cleansed, making sure to leave some of the prepping solution back to take advantage of its long term bactericidal properties.  6 cc solution made of 5 cc of 0.2% ropivacaine , 1 cc of Decadron  10 mg/cc.  Injected along the left suprascapular notch after contrast.  Vitals:   10/04/23 1425 10/04/23 1430 10/04/23 1435 10/04/23 1440  BP: (!) 153/91 (!) 157/92 (!) 163/98 (!) 160/88  Pulse:      Resp: 12 13 14 14   Temp:      TempSrc:      SpO2: 97% 96%  96%  Weight:      Height:         Start Time: 1436 hrs. End Time: 1439  hrs.  Imaging Guidance (Spinal):          Type of Imaging Technique: Fluoroscopy Guidance (Spinal) Indication(s): Fluoroscopy guidance for needle placement to enhance accuracy in procedures requiring precise needle localization for targeted delivery of medication in or near specific anatomical locations not easily accessible without such real-time imaging assistance. Exposure Time: Please see nurses notes. Contrast: Before injecting any contrast, we confirmed that the patient did not have an allergy to iodine, shellfish, or radiological contrast. Once satisfactory needle placement was completed at the desired level, radiological contrast was injected. Contrast injected under live fluoroscopy. No contrast complications. See chart for type and volume of contrast used. Fluoroscopic Guidance: I was personally present during the use of fluoroscopy. "Tunnel Vision Technique" used to obtain the best possible view of the target area. Parallax error corrected before commencing the procedure. "Direction-depth-direction" technique used to introduce the needle under continuous pulsed fluoroscopy. Once target was reached, antero-posterior, oblique, and lateral fluoroscopic projection used confirm needle placement in all planes. Images permanently stored in EMR. Interpretation: I personally interpreted the imaging intraoperatively. Adequate needle placement confirmed in multiple planes. Appropriate spread of contrast into desired area was observed. No evidence of afferent or efferent intravascular uptake. No intrathecal or subarachnoid spread observed. Permanent images saved into the patient's record.  Antibiotic Prophylaxis:   Anti-infectives (From admission, onward)    None      Indication(s): None identified   Post-operative Assessment:  Post-procedure Vital Signs:  Pulse/HCG Rate: 7677 Temp: (!) 96.8 F (36 C) Resp: 14 BP: (!) 160/88 SpO2: 96 %  EBL: None  Complications: No immediate  post-treatment complications observed by team, or reported by patient.  Note: The patient tolerated the entire procedure well. A repeat set of vitals were taken after the procedure and the patient was kept under observation following institutional policy, for this type of procedure. Post-procedural neurological assessment was performed, showing return to baseline, prior to  discharge. The patient was provided with post-procedure discharge instructions, including a section on how to identify potential problems. Should any problems arise concerning this procedure, the patient was given instructions to immediately contact us , at any time, without hesitation. In any case, we plan to contact the patient by telephone for a follow-up status report regarding this interventional procedure.  Comments:  No additional relevant information.  Plan of Care (POC)  CLINICAL DATA:  Initial evaluation for chronic neck pain, cervical radiculopathy.   EXAM: MRI CERVICAL SPINE WITHOUT CONTRAST   TECHNIQUE: Multiplanar, multisequence MR imaging of the cervical spine was performed. No intravenous contrast was administered.   COMPARISON:  Radiograph from 09/09/2023.   FINDINGS: Alignment: Straightening with reversal of the normal cervical lordosis. Trace degenerative anterolisthesis of C3 on C4 and C4 on C5.   Vertebrae: Vertebral body height maintained without acute or chronic fracture. Bone marrow signal intensity within normal limits. No worrisome osseous lesions. No abnormal marrow edema.   Cord: Normal signal and morphology.   Posterior Fossa, vertebral arteries, paraspinal tissues: Unremarkable.   Disc levels:   C2-C3: Minimal disc bulge with left-sided uncovertebral spurring. Moderate left facet arthrosis. No spinal stenosis. Mild left C3 foraminal narrowing. Right neural foramen remains patent.   C3-C4: Mild disc bulge with uncovertebral spurring. Moderate left worse than right facet arthrosis.  No spinal stenosis. Mild to moderate left C4 foraminal narrowing. Right neural foramina remains patent.   C4-C5: Trace anterolisthesis with minimal disc bulge and bilateral uncovertebral spurring. Bony ankylosis of the C4-5 facets bilaterally. No spinal stenosis. Foramina remain adequately patent.   C5-C6: Degenerative intervertebral disc space narrowing with diffuse disc osteophyte complex. Left C5-6 facet is fused. No spinal stenosis. Foramina remain patent.   C6-C7: Diffuse disc bulge with bilateral uncovertebral spurring. Mild left-sided facet arthrosis. No spinal stenosis. Foramina remain patent.   C7-T1:  Negative interspace.  No canal or foraminal stenosis.   IMPRESSION: 1. Multilevel cervical spondylosis without significant spinal stenosis or overt neural impingement. 2. Multifactorial degenerative changes with resultant mild to moderate left C3 and C4 foraminal stenosis. 3. 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     Electronically Signed   By: Virgia Griffins M.D.   On: 09/21/2023 23:26  Reviewed patient's cervical MRI with her.  She continues to endorse left-sided neck pain. Avice Kittleson Bottino has a history of greater than 3 months of moderate to severe pain which is resulted in functional impairment.  The patient has tried various conservative therapeutic options such as NSAIDs, Tylenol , muscle relaxants, physical therapy which was inadequately effective.  Patient's pain is predominantly axial with physical exam and C-MRI findings suggestive of facet arthropathy. Cervical facet medial branch nerve blocks were discussed with the patient.  Risks and benefits were reviewed.  Patient would like to proceed with LEFT C3, C4, C5, C6, medial branch nerve block.    Orders:  Orders Placed This Encounter  Procedures   CERVICAL FACET (MEDIAL BRANCH NERVE BLOCK)     Standing Status:   Future     Expected Date:   10/20/2023    Expiration Date:   01/04/2024    Scheduling Instructions:     Procedure: Cervical facet Block     Type: Medial Branch Block     Side: LEFT     Purpose: Diagnostic Radiologic Mapping     Level(s): C3-4, C4-5, C5-6,  by Fluoroscopic Pain Mapping Facet joints (C3, C4, C5,  C6, Medial Branch Nerves)     Sedation: IV Versed      Timeframe: As soon as schedule allows.    Where will this procedure be performed?:   [facility]   DG [facility] C-ARM 1-60 MIN NO REPORT    Intraoperative interpretation by procedural physician at [facility].    Standing Status:   Standing    Number of Occurrences:   1    Reason for exam::   Assistance in needle guidance and placement for procedures requiring needle placement in or near specific anatomical locations not easily accessible without such assistance.    Medications ordered for procedure: Meds ordered this encounter  Medications   iohexol  (OMNIPAQUE ) 180 MG/ML injection 10 mL    Must be Myelogram-compatible. If not available, you may substitute with a water-soluble, non-ionic, hypoallergenic, myelogram-compatible radiological contrast medium.   lidocaine  (XYLOCAINE ) 2 % (with pres) injection 400 mg   diazepam  (VALIUM ) tablet 5 mg    Make sure Flumazenil is available in the pyxis when using this medication. If oversedation occurs, administer 0.2 mg IV over 15 sec. If after 45 sec no response, administer 0.2 mg again over 1 min; may repeat at 1 min intervals; not to exceed 4 doses (1 mg)   dexamethasone  (DECADRON ) injection 10 mg   dexamethasone  (DECADRON ) injection 10 mg   ropivacaine  (PF) 2 mg/mL (0.2%) (NAROPIN ) injection 9 mL   Medications administered: We administered iohexol , lidocaine , diazepam , dexamethasone , dexamethasone , and ropivacaine  (PF) 2 mg/mL (0.2%).  See the medical record for exact dosing, route, and time of administration.  Follow-up plan:   Return in about 10 days (around 10/14/2023)  for PPE F2F.       Bilateral genicular nerve block, left suprascapular nerve block, Bilateral GN RFA 10/07/22; left suprascapular nerve block 10/04/2023, right elbow steroid injection 10/04/2023     Recent Visits Date Type Provider Dept  09/16/23 Office Visit Sharon Collin, MD [facility]  09/09/23 Office Visit Sharon Collin, MD [facility]  Showing recent visits within past 90 days and meeting all other requirements Today's Visits Date Type Provider Dept  10/04/23 Procedure visit Sharon Collin, MD [facility]  Showing today's visits and meeting all other requirements Future Appointments Date Type Provider Dept  10/14/23 Appointment Sharon Collin, MD [facility]  Showing future appointments within next 90 days and meeting all other requirements  Disposition: Discharge home  Discharge (Date  Time): 10/04/2023; 1454 hrs.   Primary Care Physician: Sharon Crooks, MD Location: [facility] Note by: Sharon Collin, MD (TTS technology used. I apologize for any typographical errors that were not detected and corrected.) Date: 10/04/2023; Time: 3:24 PM  Disclaimer:  Medicine is not an Visual merchandiser. The only guarantee in medicine is that nothing is guaranteed. It is important to note that the decision to proceed with this intervention was based on the information collected from the patient. The Data and conclusions were drawn from the patient's questionnaire, the interview, and the physical examination. Because the information was provided in large part by the patient, it cannot be guaranteed that it has not been purposely or unconsciously manipulated. Every effort has been made to obtain as much relevant data as possible for this evaluation. It is important to note that the conclusions that lead to this procedure are derived in large part from the available data. Always take into account that the treatment will also be dependent on  availability of resources and  existing treatment guidelines, considered by other Pain Management Practitioners as being common knowledge and practice, at the time of the intervention. For Medico-Legal purposes, it is also important to point out that variation in procedural techniques and pharmacological choices are the acceptable norm. The indications, contraindications, technique, and results of the above procedure should only be interpreted and judged by a Board-Certified Interventional Pain Specialist with extensive familiarity and expertise in the same exact procedure and technique.

## 2023-10-05 ENCOUNTER — Telehealth: Payer: Self-pay

## 2023-10-05 NOTE — Telephone Encounter (Signed)
 No issues post-procedure.

## 2023-10-06 ENCOUNTER — Ambulatory Visit: Admitting: Student in an Organized Health Care Education/Training Program

## 2023-10-14 ENCOUNTER — Encounter: Payer: Self-pay | Admitting: Student in an Organized Health Care Education/Training Program

## 2023-10-14 ENCOUNTER — Ambulatory Visit
Attending: Student in an Organized Health Care Education/Training Program | Admitting: Student in an Organized Health Care Education/Training Program

## 2023-10-14 VITALS — BP 119/70 | HR 80 | Temp 97.0°F | Ht 64.0 in | Wt 145.0 lb

## 2023-10-14 DIAGNOSIS — M542 Cervicalgia: Secondary | ICD-10-CM

## 2023-10-14 DIAGNOSIS — M25512 Pain in left shoulder: Secondary | ICD-10-CM

## 2023-10-14 DIAGNOSIS — M75102 Unspecified rotator cuff tear or rupture of left shoulder, not specified as traumatic: Secondary | ICD-10-CM | POA: Diagnosis present

## 2023-10-14 DIAGNOSIS — G8929 Other chronic pain: Secondary | ICD-10-CM

## 2023-10-14 DIAGNOSIS — M19021 Primary osteoarthritis, right elbow: Secondary | ICD-10-CM

## 2023-10-14 DIAGNOSIS — M12812 Other specific arthropathies, not elsewhere classified, left shoulder: Secondary | ICD-10-CM | POA: Diagnosis present

## 2023-10-14 DIAGNOSIS — M47812 Spondylosis without myelopathy or radiculopathy, cervical region: Secondary | ICD-10-CM | POA: Diagnosis present

## 2023-10-14 NOTE — Progress Notes (Signed)
 PROVIDER NOTE: Interpretation of information contained herein should be left to medically-trained personnel. Specific patient instructions are provided elsewhere under "Patient Instructions" section of medical record. This document was created in part using AI and STT-dictation technology, any transcriptional errors that may result from this process are unintentional.  Patient: Sharon Kaufman  Service: E/M   PCP: Clarise Crooks, MD  DOB: 13-Dec-1950  DOS: 10/14/2023  Provider: Cephus Collin, MD  MRN: [id number]  Delivery: Face-to-face  Specialty: Interventional Pain Management  Type: Established Patient  Setting: Ambulatory outpatient facility  Specialty designation: 09  Referring Prov.: Clarise Crooks, MD  Location: Outpatient office facility       History of present illness (HPI) Sharon Kaufman, a 73 y.o. year old female, is here today because of her Chronic left shoulder pain [M25.512, G89.29]. Sharon Kaufman primary complain today is Elbow Pain  Pain Assessment: Severity of Chronic pain is reported as a 5 /10. Location: Elbow Right/Denies. Onset: More than a month ago. Quality: Aching, Burning, Sore, Constant. Timing: Constant. Modifying factor(s): meds. Vitals:  height is 5\' 4"  (1.626 m) and weight is 145 lb (65.8 kg). Her temperature is 97 F (36.1 C) (abnormal). Her blood pressure is 119/70 and her pulse is 80. Her oxygen saturation is 99%.  BMI: Estimated body mass index is 24.89 kg/m as calculated from the following:   Height as of this encounter: 5\' 4"  (1.626 m).   Weight as of this encounter: 145 lb (65.8 kg). Last encounter: 09/16/2023. Last procedure: 10/04/2023.  Reason for encounter:  Excellent response to left suprascapular nerve block, endorses significant reduction in her left shoulder pain and improvement in left shoulder range of motion.  Rates pain relief at approximately 80% Unfortunately right elbow injection was not very helpful.  Consider Voltaren  and topicals to that  region. She has an upcoming appointment in June to help address her neck pain related to cervical facet arthropathy.  ROS  Constitutional: Denies any fever or chills Gastrointestinal: No reported hemesis, hematochezia, vomiting, or acute GI distress Musculoskeletal: Right elbow pain, cervicalgia Neurological: No reported episodes of acute onset apraxia, aphasia, dysarthria, agnosia, amnesia, paralysis, loss of coordination, or loss of consciousness  Medication Review  Accu-Chek FastClix Lancets, Alcaftadine, Famotidine, Glycerin, LORazepam, Lysine, Magnesium , Naltrexone, Semaglutide(0.25 or 0.5MG /DOS), Soothe XP Xtra Protection, acetaminophen , aspirin EC, cromolyn, docusate sodium , fluticasone, glucose blood, ketotifen, lisinopril , meloxicam , metFORMIN , and zolpidem   History Review  Allergy: Sharon Kaufman is allergic to quetiapine, alendronate , atorvastatin, codeine , pravastatin, risedronate, hydrocodone-acetaminophen , and amitriptyline . Drug: Sharon Kaufman  reports no history of drug use. Alcohol :  reports current alcohol  use of about 2.0 standard drinks of alcohol  per week. Tobacco:  reports that she quit smoking about 17 years ago. Her smoking use included cigarettes. She has never used smokeless tobacco. Social: Sharon Kaufman  reports that she quit smoking about 17 years ago. Her smoking use included cigarettes. She has never used smokeless tobacco. She reports current alcohol  use of about 2.0 standard drinks of alcohol  per week. She reports that she does not use drugs. Medical:  has a past medical history of Allergy, Anxiety, Arthritis, Breast cancer (HCC), Breast cancer, left (HCC) (2008), Diabetes mellitus without complication (HCC), Fibromyalgia, GERD (gastroesophageal reflux disease), History of hepatitis C, Hyperlipidemia, Hypertension, NASH (nonalcoholic steatohepatitis), and Personal history of radiation therapy (2008). Surgical: Sharon Kaufman  has a past surgical history that includes  Splenectomy (1973); Appendectomy (2009); Breast lumpectomy (Left, 2008); Ankle Fusion (Left, 2009); Rotator cuff repair (Left, 2011); Total  shoulder replacement (Right, 2019); Reduction mammaplasty (Right, 2014); Tonsillectomy; Cataract extraction, bilateral; Colonoscopy; Esophagogastroduodenoscopy; and Total knee arthroplasty (Right, 01/14/2023). Family: family history includes Alcohol  abuse in her father; Liver disease in her father; Rheum arthritis in her mother.  Laboratory Chemistry Profile   Renal Lab Results  Component Value Date   BUN 14 01/15/2023   CREATININE 0.62 01/15/2023   BCR 30 (H) 04/28/2022   GFRNONAA >60 01/15/2023    Hepatic Lab Results  Component Value Date   AST 24 01/05/2023   ALT 21 01/05/2023   ALBUMIN 3.9 01/05/2023   ALKPHOS 47 01/05/2023    Electrolytes Lab Results  Component Value Date   NA 138 01/15/2023   K 4.2 01/15/2023   CL 104 01/15/2023   CALCIUM 9.2 01/15/2023   PHOS 2.7 (L) 12/02/2021    Bone Lab Results  Component Value Date   VD25OH 48.6 04/28/2022    Inflammation (CRP: Acute Phase) (ESR: Chronic Phase) No results found for: "CRP", "ESRSEDRATE", "LATICACIDVEN"       Note: Above Lab results reviewed.  Recent Imaging Review  DG PAIN CLINIC C-ARM 1-60 MIN NO REPORT Fluoro was used, but no Radiologist interpretation will be provided.  Please refer to "NOTES" tab for provider progress note. Note: Reviewed         Physical Exam  General appearance: Well nourished, well developed, and well hydrated. In no apparent acute distress Mental status: Alert, oriented x 3 (person, place, & time)       Respiratory: No evidence of acute respiratory distress Eyes: PERLA Vitals: BP 119/70   Pulse 80   Temp (!) 97 F (36.1 C)   Ht 5\' 4"  (1.626 m)   Wt 145 lb (65.8 kg)   SpO2 99%   BMI 24.89 kg/m  BMI: Estimated body mass index is 24.89 kg/m as calculated from the following:   Height as of this encounter: 5\' 4"  (1.626 m).   Weight as of  this encounter: 145 lb (65.8 kg). Ideal: Ideal body weight: 54.7 kg (120 lb 9.5 oz) Adjusted ideal body weight: 59.1 kg (130 lb 5.7 oz)  Cervicalgia  Improvement in left shoulder pain and range of motion  Persistent right elbow pain  Assessment   Diagnosis Status  1. Chronic left shoulder pain   2. Left rotator cuff tear arthropathy (hx of left shoulder surgery)   3. Arthritis of right elbow   4. Cervicalgia   5. Cervical spondylosis    Controlled Controlled Controlled   Updated Problems: No problems updated.  Plan of Care  1.  Continue to monitor left shoulder pain.  Positive response after left suprascapular nerve block.  Consider suprascapular nerve RFA in the future 2.  Consider topicals to right elbow such as Voltaren  gel, Bengay, capsaicin over-the-counter 3.  Keep follow-up as scheduled for cervical facet medial branch nerve blocks to help target cervicalgia related to cervical facet joint syndrome   Follow-up plan:   Return for Keep sch. appt.     Bilateral genicular nerve block, left suprascapular nerve block, Bilateral GN RFA 10/07/22; left suprascapular nerve block 10/04/2023, right elbow steroid injection 10/04/2023     Recent Visits Date Type Provider Dept  10/04/23 Procedure visit Cephus Collin, MD [facility]  09/16/23 Office Visit Cephus Collin, MD [facility]  09/09/23 Office Visit Cephus Collin, MD [facility]  Showing recent visits within past 90 days and meeting all other requirements Today's Visits Date Type Provider Dept  10/14/23 Office Visit Rhesa Celeste,  Ike Malady, MD [facility]  Showing today's visits and meeting all other requirements Future Appointments Date Type Provider Dept  11/10/23 Appointment Cephus Collin, MD [facility]  Showing future appointments within next 90 days and meeting all other requirements   I discussed the assessment and treatment plan with the patient. The patient was  provided an opportunity to ask questions and all were answered. The patient agreed with the plan and demonstrated an understanding of the instructions.  Patient advised to call back or seek an in-person evaluation if the symptoms or condition worsens.  Duration of encounter: 20 minutes.  Total time on encounter, as per AMA guidelines included both the face-to-face and non-face-to-face time personally spent by the physician and/or other qualified health care professional(s) on the day of the encounter (includes time in activities that require the physician or other qualified health care professional and does not include time in activities normally performed by clinical staff). Physician's time may include the following activities when performed: Preparing to see the patient (e.g., pre-charting review of records, searching for previously ordered imaging, lab work, and nerve conduction tests) Review of prior analgesic pharmacotherapies. Reviewing PMP Interpreting ordered tests (e.g., lab work, imaging, nerve conduction tests) Performing post-procedure evaluations, including interpretation of diagnostic procedures Obtaining and/or reviewing separately obtained history Performing a medically appropriate examination and/or evaluation Counseling and educating the patient/family/caregiver Ordering medications, tests, or procedures Referring and communicating with other health care professionals (when not separately reported) Documenting clinical information in the electronic or other health record Independently interpreting results (not separately reported) and communicating results to the patient/ family/caregiver Care coordination (not separately reported)  Note by: Cephus Collin, MD (TTS and AI technology used. I apologize for any typographical errors that were not detected and corrected.) Date: 10/14/2023; Time: 3:43 PM

## 2023-10-14 NOTE — Progress Notes (Signed)
 Safety precautions to be maintained throughout the outpatient stay will include: orient to surroundings, keep bed in low position, maintain call bell within reach at all times, provide assistance with transfer out of bed and ambulation.

## 2023-11-10 ENCOUNTER — Ambulatory Visit (HOSPITAL_BASED_OUTPATIENT_CLINIC_OR_DEPARTMENT_OTHER): Admitting: Student in an Organized Health Care Education/Training Program

## 2023-11-10 ENCOUNTER — Encounter: Payer: Self-pay | Admitting: Student in an Organized Health Care Education/Training Program

## 2023-11-10 ENCOUNTER — Ambulatory Visit
Admission: RE | Admit: 2023-11-10 | Discharge: 2023-11-10 | Disposition: A | Discharge status: Home / Self Care | Source: Ambulatory Visit | Attending: Student in an Organized Health Care Education/Training Program | Admitting: Student in an Organized Health Care Education/Training Program

## 2023-11-10 VITALS — BP 135/75 | HR 74 | Temp 97.4°F | Resp 16 | Ht 64.0 in | Wt 145.0 lb

## 2023-11-10 DIAGNOSIS — M25521 Pain in right elbow: Secondary | ICD-10-CM | POA: Diagnosis present

## 2023-11-10 DIAGNOSIS — M7711 Lateral epicondylitis, right elbow: Secondary | ICD-10-CM | POA: Diagnosis present

## 2023-11-10 DIAGNOSIS — M47812 Spondylosis without myelopathy or radiculopathy, cervical region: Secondary | ICD-10-CM | POA: Insufficient documentation

## 2023-11-10 MED ORDER — DEXAMETHASONE SODIUM PHOSPHATE 10 MG/ML IJ SOLN
10.0000 mg | Freq: Once | INTRAMUSCULAR | Status: AC
  Administered 2023-11-10: 10 mg
  Filled 2023-11-10: qty 1

## 2023-11-10 MED ORDER — LACTATED RINGERS IV SOLN: Freq: Once | INTRAVENOUS | Status: AC

## 2023-11-10 MED ORDER — ROPIVACAINE HCL 2 MG/ML IJ SOLN
9.0000 mL | Freq: Once | INTRAMUSCULAR | Status: AC
  Administered 2023-11-10: 9 mL via PERINEURAL
  Filled 2023-11-10: qty 20

## 2023-11-10 MED ORDER — MIDAZOLAM HCL 2 MG/2ML IJ SOLN
0.5000 mg | Freq: Once | INTRAMUSCULAR | Status: AC
  Administered 2023-11-10: 2 mg via INTRAVENOUS
  Filled 2023-11-10: qty 2

## 2023-11-10 MED ORDER — LIDOCAINE HCL 2 % IJ SOLN
20.0000 mL | Freq: Once | INTRAMUSCULAR | Status: AC
  Administered 2023-11-10: 200 mg
  Filled 2023-11-10: qty 40

## 2023-11-10 NOTE — Patient Instructions (Signed)

## 2023-11-10 NOTE — Progress Notes (Signed)
 Safety precautions to be maintained throughout the outpatient stay will include: orient to surroundings, keep bed in low position, maintain call bell within reach at all times, provide assistance with transfer out of bed and ambulation.

## 2023-11-10 NOTE — Progress Notes (Signed)
 PROVIDER NOTE: Interpretation of information contained herein should be left to medically-trained personnel. Specific patient instructions are provided elsewhere under Patient Instructions section of medical record. This document was created in part using STT-dictation technology, any transcriptional errors that may result from this process are unintentional.  Patient: Sharon Kaufman Type: Established DOB: [birth year] MRN: [id number] PCP: Clarise Crooks, MD (Inactive)  Service: Procedure DOS: 11/10/2023 Setting: Ambulatory Location: Ambulatory outpatient facility Delivery: Face-to-face Provider: Cephus Collin, MD Specialty: Interventional Pain Management Specialty designation: 09 Location: Outpatient facility Ref. Prov.: Cephus Collin, MD       Interventional Therapy   Procedure: Cervical Facet Medial Branch Block(s) #1  Laterality: Left  Level: C3, C4, C5, and C6 Medial Branch Level(s). Injecting these levels blocks the C3-4, C4-5, and C5-6 cervical facet joints.  Imaging: Fluoroscopic guidance Anesthesia: Local anesthesia (1-2% Lidocaine ) Sedation: Minimal Sedation                       DOS: 11/10/2023  Performed by: Cephus Collin, MD  Purpose: Diagnostic/Therapeutic Indications: Cervicalgia (cervical spine axial pain) severe enough to impact quality of life or function. 1. Cervical spondylosis   2. Cervical facet joint syndrome   3. Right elbow pain   4. Lateral epicondylitis of right elbow    NAS-11 Pain score:   Pre-procedure: 7 /10   Post-procedure: 2 /10     Position / Prep / Materials:  Position: Prone. Head in cradle. C-spine slightly flexed. Prep solution: ChloraPrep (2% chlorhexidine  gluconate and 70% isopropyl alcohol ) Prep Area: Posterior Cervico-thoracic Region. From occipital ridge to tip of scapula, and from shoulder to shoulder. Entire posterior and lateral neck surface. Materials:  Tray: Block Needle(s):  Type: Spinal  Gauge (G): 22  Length: 3.5-in   Qty:  3     H&P (Pre-op Assessment):  [name] is a 73 y.o. (year old), female patient, seen today for interventional treatment. 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 [name] has a current medication list which includes the following prescription(s): accu-chek fastclix lancets, accu-chek guide, acetaminophen , aspirin ec, cromolyn, docusate sodium , famotidine, fluticasone, lisinopril , lorazepam, lysine, magnesium , meloxicam , metformin , naltrexone, semaglutide(0.25 or 0.5mg /dos), zolpidem , lastacaft, soothe xp xtra protection, glycerin, and ketotifen, and the following Facility-Administered Medications: lactated ringers . Her primarily concern today is the Neck Pain (left)  Initial Vital Signs:  Pulse/HCG Rate: 74ECG Heart Rate: 78 Temp: (!) 97.4 F (36.3 C) Resp: 16 BP: 115/64 SpO2: 98 %  BMI: Estimated body mass index is 24.89 kg/m as calculated from the following:   Height as of this encounter: 5' 4 (1.626 m).   Weight as of this encounter: 145 lb (65.8 kg).  Risk Assessment: Allergies: Reviewed. She is allergic to quetiapine, alendronate , atorvastatin, codeine , pravastatin, risedronate, hydrocodone-acetaminophen , and amitriptyline .  Allergy Precautions: None required Coagulopathies: Reviewed. None identified.  Blood-thinner therapy: None at this time Active Infection(s): Reviewed. None identified. [name] is afebrile  Site Confirmation: [name] was asked to confirm the procedure and laterality before marking the site Procedure checklist: Completed Consent: Before the procedure and under the influence of no sedative(s),  amnesic(s), or anxiolytics, the patient was informed of the treatment options, risks and possible complications. To fulfill our ethical and legal obligations, as recommended by the American  Medical Association's Code of Ethics, I have informed the patient of my clinical impression; the nature and purpose of the treatment or procedure; the risks, benefits, and possible complications of the intervention; the alternatives, including doing nothing; the risk(s) and benefit(s) of the alternative treatment(s) or procedure(s); and the risk(s) and benefit(s) of doing nothing. The patient was provided information about the general risks and possible complications associated with the procedure. These may include, but are not limited to: failure to achieve desired goals, infection, bleeding, organ or nerve damage, allergic reactions, paralysis, and death. In addition, the patient was informed of those risks and complications associated to Spine-related procedures, such as failure to decrease pain; infection (i.e.: Meningitis, epidural or intraspinal abscess); bleeding (i.e.: epidural hematoma, subarachnoid hemorrhage, or any other type of intraspinal or peri-dural bleeding); organ or nerve damage (i.e.: Any type of peripheral nerve, nerve root, or spinal cord injury) with subsequent damage to sensory, motor, and/or autonomic systems, resulting in permanent pain, numbness, and/or weakness of one or several areas of the body; allergic reactions; (i.e.: anaphylactic reaction); and/or death. Furthermore, the patient was informed of those risks and complications associated with the medications. These include, but are not limited to: allergic reactions (i.e.: anaphylactic or anaphylactoid reaction(s)); adrenal axis suppression; blood sugar elevation that in diabetics may result in ketoacidosis or comma; water retention that in patients with history of congestive heart failure may result in shortness of breath, pulmonary edema, and  decompensation with resultant heart failure; weight gain; swelling or edema; medication-induced neural toxicity; particulate matter embolism and blood vessel occlusion with resultant organ, and/or nervous system infarction; and/or aseptic necrosis of one or more joints. Finally, the patient was informed that Medicine is not an exact science; therefore, there is also the possibility of unforeseen or unpredictable risks and/or possible complications that may result in a catastrophic outcome. The patient indicated having understood very clearly. We have given the patient no guarantees and we have made no promises. Enough time was given to the patient to ask questions, all of which were answered to the patient's satisfaction. Ms. Norsworthy has indicated that she wanted to continue with the procedure. Attestation: I, the ordering provider, attest that I have discussed with the patient the benefits, risks, side-effects, alternatives, likelihood of achieving goals, and potential problems during recovery for the procedure that I have provided informed consent. Date  Time: 11/10/2023 10:29 AM  Pre-Procedure Preparation:  Monitoring: As per clinic protocol. Respiration, ETCO2, SpO2, BP, heart rate and rhythm monitor placed and checked for adequate function Safety Precautions: Patient was assessed for positional comfort and pressure points before starting the procedure. Time-out: I initiated and conducted the Time-out before starting the procedure, as per protocol. The patient was asked to participate by confirming the accuracy of the Time Out information. Verification of the correct person, site, and procedure were performed and confirmed by me, the nursing staff, and the patient. Time-out conducted as per Joint Commission's Universal Protocol (UP.01.01.01). Time: 1114 Start Time: 1114 hrs.  Description/Narrative of Procedure:          Laterality: See above. Targeted Levels: See above.  Rationale (medical  necessity): procedure needed and proper for the diagnosis and/or treatment of the patient's medical symptoms and needs. Procedural Technique Safety Precautions: Aspiration looking for blood return was conducted prior to all injections. At no point did we inject any substances, as a needle was being advanced. No attempts were made at seeking any paresthesias. Safe injection practices and needle disposal techniques used. Medications  properly checked for expiration dates. SDV (single dose vial) medications used. Description of the Procedure: Protocol guidelines were followed. The patient was assisted into a comfortable position. The target area was identified and the area prepped in the usual manner. Skin & deeper tissues infiltrated with local anesthetic. Appropriate amount of time allowed to pass for local anesthetics to take effect. The procedure needles were then advanced to the target area. Proper needle placement secured. Negative aspiration confirmed. Solution injected in intermittent fashion, asking for systemic symptoms every 0.5cc of injectate. The needles were then removed and the area cleansed, making sure to leave some of the prepping solution back to take advantage of its long term bactericidal properties.  Technical description of process:   C3 Medial Branch Nerve Block (MBB): The target area for the C3 dorsal medial articular branch is the lateral concave waist of the articular pillar of C3. Under fluoroscopic guidance, a Quincke needle was inserted until contact was made with os over the postero-lateral aspect of the articular pillar of C3 (target area). After negative aspiration for blood,2 mL of the nerve block solution was injected without difficulty or complication. The needle was removed intact. C4 Medial Branch Nerve Block (MBB): The target area for the C4 dorsal medial articular branch is the lateral concave waist of the articular pillar of C4. Under fluoroscopic guidance, a Quincke  needle was inserted until contact was made with os over the postero-lateral aspect of the articular pillar of C4 (target area). After negative aspiration for blood, 2mL of the nerve block solution was injected without difficulty or complication. The needle was removed intact. C5 Medial Branch Nerve Block (MBB): The target area for the C5 dorsal medial articular branch is the lateral concave waist of the articular pillar of C5. Under fluoroscopic guidance, a Quincke needle was inserted until contact was made with os over the postero-lateral aspect of the articular pillar of C5 (target area). After negative aspiration for blood, 2mL of the nerve block solution was injected without difficulty or complication. The needle was removed intact. C6 Medial Branch Nerve Block (MBB): The target area for the C6 dorsal medial articular branch is the lateral concave waist of the articular pillar of C6. Under fluoroscopic guidance, a Quincke needle was inserted until contact was made with os over the postero-lateral aspect of the articular pillar of C6 (target area). After negative aspiration for blood, 2mL of the nerve block solution was injected without difficulty or complication. The needle was removed intact.   Once the entire procedure was completed, the treated area was cleaned, making sure to leave some of the prepping solution back to take advantage of its long term bactericidal properties.  Anatomy Reference Guide:      Facet Joint Innervation  C1-2 Third occipital Nerve (TON)  C2-3 TON, C3  Medial Branch  C3-4 C3, C4                     C4-5 C4, C5                     C5-6 C5, C6                     C6-7 C6, C7                     C7-T1 C7, C8  Cervical Facet Pain Pattern overlap:   Vitals:   11/10/23 1113 11/10/23 1115 11/10/23 1119 11/10/23 1124  BP: (!) 168/92 (!) 164/99 (!) 173/91 135/75  Pulse:      Resp: 13 19 15 16   Temp:      SpO2: 99% 99% 98% 98%  Weight:       Height:         Start Time: 1114 hrs. End Time: 1118 hrs.  Imaging Guidance (Spinal):         Type of Imaging Technique: Fluoroscopy Guidance (Spinal) Indication(s): Fluoroscopy guidance for needle placement to enhance accuracy in procedures requiring precise needle localization for targeted delivery of medication in or near specific anatomical locations not easily accessible without such real-time imaging assistance. Exposure Time: Please see nurses notes. Contrast: None used. Fluoroscopic Guidance: I was personally present during the use of fluoroscopy. Tunnel Vision Technique used to obtain the best possible view of the target area. Parallax error corrected before commencing the procedure. Direction-depth-direction technique used to introduce the needle under continuous pulsed fluoroscopy. Once target was reached, antero-posterior, oblique, and lateral fluoroscopic projection used confirm needle placement in all planes. Images permanently stored in EMR. Interpretation: No contrast injected. I personally interpreted the imaging intraoperatively. Adequate needle placement confirmed in multiple planes. Permanent images saved into the patient's record.  Post-operative Assessment:  Post-procedure Vital Signs:  Pulse/HCG Rate: 7476 Temp: (!) 97.4 F (36.3 C) Resp: 16 BP: 135/75 SpO2: 98 %  EBL: None  Complications: No immediate post-treatment complications observed by team, or reported by patient.  Note: The patient tolerated the entire procedure well. A repeat set of vitals were taken after the procedure and the patient was kept under observation following institutional policy, for this type of procedure. Post-procedural neurological assessment was performed, showing return to baseline, prior to discharge. The patient was provided with post-procedure discharge instructions, including a section on how to identify potential problems. Should any problems arise concerning this  procedure, the patient was given instructions to immediately contact us , at any time, without hesitation. In any case, we plan to contact the patient by telephone for a follow-up status report regarding this interventional procedure.  Comments:  No additional relevant information.  Plan of Care (POC)  Orders:  Orders Placed This Encounter  Procedures   DG PAIN CLINIC C-ARM 1-60 MIN NO REPORT    Intraoperative interpretation by procedural physician at [facility].    Standing Status:   Standing    Number of Occurrences:   1    Reason for exam::   Assistance in needle guidance and placement for procedures requiring needle placement in or near specific anatomical locations not easily accessible without such assistance.   AMB referral to orthopedics    Referral Priority:   Routine    Referral Type:   Consultation    Referred to Provider:   Wilhelmenia Harada, MD    Number of Visits Requested:   1   Referral to orthopedics for persistent right elbow pain for possible right lateral epicondylitis refractory to right elbow steroid injection.  Medications ordered for procedure: Meds ordered this encounter  Medications   lidocaine  (XYLOCAINE ) 2 % (with pres) injection 400 mg   lactated ringers  infusion   midazolam  (VERSED ) injection 0.5-2 mg    Make sure Flumazenil is available in the pyxis when using this medication. If oversedation occurs, administer 0.2 mg IV over 15 sec. If after 45 sec no response, administer 0.2 mg again over 1 min; may  repeat at 1 min intervals; not to exceed 4 doses (1 mg)   ropivacaine  (PF) 2 mg/mL (0.2%) (NAROPIN ) injection 9 mL   dexamethasone  (DECADRON ) injection 10 mg   Medications administered: We administered lidocaine , lactated ringers , midazolam , ropivacaine  (PF) 2 mg/mL (0.2%), and dexamethasone .  See the medical record for exact dosing, route, and time of administration.    Bilateral genicular nerve block, left suprascapular nerve block, Bilateral  GN RFA 10/07/22; left suprascapular nerve block 10/04/2023, right elbow steroid injection 10/04/2023 Left C3, C4, C5, C6 cervical facet medial branch nerve block #1 11/10/2023     Follow-up plan:   Return in about 20 days (around 11/30/2023) for PPE, F2F.     Recent Visits Date Type Provider Dept  10/14/23 Office Visit Cephus Collin, MD [facility]  10/04/23 Procedure visit Cephus Collin, MD [facility]  09/16/23 Office Visit Cephus Collin, MD [facility]  09/09/23 Office Visit Cephus Collin, MD [facility]  Showing recent visits within past 90 days and meeting all other requirements Today's Visits Date Type Provider Dept  11/10/23 Procedure visit Cephus Collin, MD [facility]  Showing today's visits and meeting all other requirements Future Appointments Date Type Provider Dept  11/30/23 Appointment Cephus Collin, MD [facility]  Showing future appointments within next 90 days and meeting all other requirements   Disposition: Discharge home  Discharge (Date  Time): 11/10/2023; 1131 hrs.   Primary Care Physician: Clarise Crooks, MD (Inactive) Location: [facility] Note by: Cephus Collin, MD (TTS technology used. I apologize for any typographical errors that were not detected and corrected.) Date: 11/10/2023; Time: 11:33 AM  Disclaimer:  Medicine is not an Visual merchandiser. The only guarantee in medicine is that nothing is guaranteed. It is important to note that the decision to proceed with this intervention was based on the information collected from the patient. The Data and conclusions were drawn from the patient's questionnaire, the interview, and the physical examination. Because the information was provided in large part by the patient, it cannot be guaranteed that it has not been purposely or unconsciously manipulated. Every effort has been made to obtain as much relevant data as possible for this  evaluation. It is important to note that the conclusions that lead to this procedure are derived in large part from the available data. Always take into account that the treatment will also be dependent on availability of resources and existing treatment guidelines, considered by other Pain Management Practitioners as being common knowledge and practice, at the time of the intervention. For Medico-Legal purposes, it is also important to point out that variation in procedural techniques and pharmacological choices are the acceptable norm. The indications, contraindications, technique, and results of the above procedure should only be interpreted and judged by a Board-Certified Interventional Pain Specialist with extensive familiarity and expertise in the same exact procedure and technique.

## 2023-11-11 ENCOUNTER — Telehealth: Payer: Self-pay

## 2023-11-11 NOTE — Telephone Encounter (Signed)
 No issues post-procedure.

## 2023-11-23 ENCOUNTER — Telehealth: Payer: Self-pay

## 2023-11-23 DIAGNOSIS — M25521 Pain in right elbow: Secondary | ICD-10-CM

## 2023-11-23 NOTE — Telephone Encounter (Signed)
 Patient was notified to have xrays and the address was given  orders are in

## 2023-11-24 ENCOUNTER — Ambulatory Visit
Admission: RE | Admit: 2023-11-24 | Discharge: 2023-11-24 | Disposition: A | Discharge status: Home / Self Care | Source: Ambulatory Visit | Attending: Orthopedic Surgery | Admitting: Orthopedic Surgery

## 2023-11-24 ENCOUNTER — Ambulatory Visit
Admission: RE | Admit: 2023-11-24 | Discharge: 2023-11-24 | Disposition: A | Discharge status: Home / Self Care | Attending: Orthopedic Surgery | Admitting: Orthopedic Surgery

## 2023-11-24 ENCOUNTER — Encounter: Payer: Self-pay | Admitting: Orthopedic Surgery

## 2023-11-24 ENCOUNTER — Ambulatory Visit: Admitting: Orthopedic Surgery

## 2023-11-24 VITALS — BP 124/79 | Ht 64.0 in | Wt 144.0 lb

## 2023-11-24 DIAGNOSIS — M7711 Lateral epicondylitis, right elbow: Secondary | ICD-10-CM | POA: Diagnosis not present

## 2023-11-24 DIAGNOSIS — M25521 Pain in right elbow: Secondary | ICD-10-CM | POA: Insufficient documentation

## 2023-11-24 MED ORDER — METHYLPREDNISOLONE ACETATE 40 MG/ML IJ SUSP
40.0000 mg | Freq: Once | INTRAMUSCULAR | Status: AC
  Administered 2023-11-24: 40 mg via INTRAMUSCULAR

## 2023-11-24 NOTE — Patient Instructions (Addendum)
Instructions Following Joint Injections  In clinic today, you received an injection in one of your joints (sometimes more than one).  Occasionally, you can have some pain at the injection site, this is normal.  You can place ice at the injection site, or take over-the-counter medications such as Tylenol (acetaminophen) or Advil (ibuprofen).  Please follow all directions listed on the bottle.  If your joint (knee or shoulder) becomes swollen, red or very painful, please contact the clinic for additional assistance.   Two medications were injected, including lidocaine and a steroid (often referred to as cortisone).  Lidocaine is effective almost immediately but wears off quickly.  However, the steroid can take a few days to improve your symptoms.  In some cases, it can make your pain worse for a couple of days.  Do not be concerned if this happens as it is common.  You can apply ice or take some over-the-counter medications as needed.   Injections in the same joint cannot be repeated for 3 months.  This helps to limit the risk of an infection in the joint.  If you were to develop an infection in your joint, the best treatment option would be surgery.     [facility] Do exercises exactly as told by your health care provider and adjust them as directed. It is normal to feel mild stretching, pulling, tightness, or discomfort as you do these exercises. Stop right away if you feel sudden pain or your pain gets worse.   Stretching and range-of-motion exercises These exercises warm up your muscles and joints and improve the movement andflexibility of your elbow. Wrist flexion, assisted  Straighten your left / right elbow in front of you with your palm facing down toward the floor. If told by your health care provider, bend your left / right elbow to a 90-degree angle (right angle) at your side instead of holding it straight. With your other hand, gently push over the back of your left / right  hand so your fingers point toward the floor (flexion). Stop when you feel a gentle stretch on the back of your forearm. Hold this position for 10 seconds. Repeat 10 times. Complete this exercise 1-2 times a day. Wrist extension, assisted  Straighten your left / right elbow in front of you with your palm facing up toward the ceiling. If told by your health care provider, bend your left / right elbow to a 90-degree angle (right angle) at your side instead of holding it straight. With your other hand, gently pull your left / right hand and fingers toward the floor (extension). Stop when you feel a gentle stretch on the palm side of your forearm. Hold this position for 10 seconds. Repeat 10 times. Complete this exercise 1-2 times a day. Assisted forearm rotation, supination Sit or stand with your elbows at your side. Bend your left / right elbow to a 90-degree angle (right angle). Using your uninjured hand, turn your left / right palm up toward the ceiling (supination) until you feel a gentle stretch along the inside of your forearm. Hold this position for 10 seconds. Repeat 10 times. Complete this exercise 1-2 times a day. Assisted forearm rotation, pronation Sit or stand with your elbows at your side. Bend your left / right elbow to a 90-degree angle (right angle). Using your uninjured hand, turn your left / right palm down toward the floor (pronation) until you feel a gentle stretch along the outside of your forearm. Hold this position  for 10 seconds. Repeat 10 times. Complete this exercise 1-2 times a day. Strengthening exercises These exercises build strength and endurance in your forearm and elbow. Endurance is the ability to use your muscles for a long time, even after theyget tired. Radial deviation  Stand with a 5 lbs weight or a hammer in your left / right hand. Or, sit while holding a rubber exercise band or tubing, with your left / right forearm supported on a table or  countertop. Position your forearm so that the thumb is facing the ceiling, as if you are going to clap your hands. This is the neutral position. Raise your hand upward in front of you so your thumb moves toward the ceiling (radial deviation), or pull up on the rubber tubing. Keep your forearm and elbow still while you move your wrist only. Hold this position for 10 seconds. Slowly return to the starting position. Repeat 10 times. Complete this exercise 1-2 times a day. Wrist extension, eccentric Sit with your left / right forearm palm-down and supported on a table or other surface. Let your left / right wrist extend over the edge of the surface. Hold a 5 lbs (can of soup) weight or a piece of exercise band or tubing in your left / right hand. If using a rubber exercise band or tubing, hold the other end of the tubing with your other hand. Use your uninjured hand to move your left / right hand up toward the ceiling. Take your uninjured hand away and slowly return to the starting position using only your left / right hand. Lowering your arm under tension is called eccentric extension. Repeat 10 times. Complete this exercise 1-2 times a day. Wrist extension  Do not do this exercise if it causes pain at the outside of your elbow. Only do this exercise once instructed by your health care provider. Sit with your left / right forearm supported on a table or other surface and your palm turned down toward the floor. Let your left / right wrist extend over the edge of the surface. Hold a 5 lbs weight or a piece of rubber exercise band or tubing. If you are using a rubber exercise band or tubing, hold the band or tubing in place with your other hand to provide resistance. Slowly bend your wrist so your hand moves up toward the ceiling (extension). Move only your wrist, keeping your forearm and elbow still. Hold this position for 10 seconds. Slowly return to the starting position. Repeat 10 times. Complete  this exercise 1-2 times a day. Forearm rotation, supination To do this exercise, you will need a lightweight hammer or rubber mallet. Sit with your left / right forearm supported on a table or other surface. Bend your elbow to a 90-degree angle (right angle). Position your forearm so that your palm is facing down toward the floor, with your hand resting over the edge of the table. Hold a hammer in your left / right hand. To make this exercise easier, hold the hammer near the head of the hammer. To make this exercise harder, hold the hammer near the end of the handle. Without moving your wrist or elbow, slowly rotate your forearm so your palm faces up toward the ceiling (supination). Hold this position for 10 seconds. Slowly return to the starting position. Repeat 10 times. Complete this exercise 1-2 times a day. Shoulder blade squeeze Sit in a stable chair or stand with good posture. If you are sitting down, do not  let your back touch the back of the chair. Your arms should be at your sides with your elbows bent to a 90-degree angle (right angle). Position your forearms so that your thumbs are facing the ceiling (neutral position). Without lifting your shoulders up, squeeze your shoulder blades tightly together. Hold this position for 10 seconds. Slowly release and return to the starting position. Repeat 10 times. Complete this exercise 1-2 times a day. This information is not intended to replace advice given to you by your health care provider. Make sure you discuss any questions you have with your healthcare provider. Document Revised: 08/02/2019 Document Reviewed: 08/02/2019 Elsevier Patient Education  2022 [employer].

## 2023-11-25 ENCOUNTER — Encounter: Payer: Self-pay | Admitting: Orthopedic Surgery

## 2023-11-25 NOTE — Progress Notes (Signed)
 New Patient Visit  Assessment: Sharon Kaufman is a 73 y.o. female with the following: 1. Lateral epicondylitis of right elbow  Plan: OTHELL DILUZIO has pain in the lateral right elbow.  It is in the area of the lateral epicondyle.  Pain is recreated with resisted long finger extension.  She does have pain with resisted wrist extension.  This is consistent with lateral epicondylitis.  She has previously had an injection, and she states that it was not effective.  Some of her pain is closer to the posterior aspect of the elbow, in line with the triceps tendon.  We discussed obtaining an MRI to see if there is an injury to the extensor tendon, but she would like to avoid this at this time due to financial constraints.  She is interested in trying another injection.  This was completed in clinic today.  If she continues to have issues, she will return to clinic.  Procedure note injection - Right elbow, lateral epicondyle  Verbal consent was obtained to inject the Right elbow, lateral epicondyle Timeout was completed to confirm the site of injection.  The skin was prepped with alcohol  and ethyl chloride was sprayed at the injection site.  A 21-gauge needle was used to inject 40 mg of Depo-Medrol  and 1% lidocaine  (1 cc) into the Right lateral epicondyle using a direct lateral approach.  There were no complications. Patient tolerated the procedure well. A sterile bandage was applied   Follow-up: Return if symptoms worsen or fail to improve.  Subjective:  Chief Complaint  Patient presents with   Elbow Pain    Pain in the right elbow  she had a injection in may and it did not help any      History of Present Illness: Sharon Kaufman is a 73 y.o. female who has been referred by  Wallie Sherry, MD for evaluation of right elbow pain.  She is right-hand dominant.  She has had progressively worsening pain in the right elbow.  Pain is in the posterior lateral aspect of the elbow.  No specific injury.   She has tried medications.  She wore strap.  She has recently had an injection, with limited improvement in her symptoms.  Certain motions make it very painful.  She has not worked with physical therapy.   Review of Systems: No fevers or chills No numbness or tingling No chest pain No shortness of breath No bowel or bladder dysfunction No GI distress No headaches   Medical History:  Past Medical History:  Diagnosis Date   Allergy    Anxiety    Arthritis    Breast cancer (HCC)    Breast cancer, left (HCC) 2008   Diabetes mellitus without complication (HCC)    Fibromyalgia    GERD (gastroesophageal reflux disease)    History of hepatitis C    reversed with medication   Hyperlipidemia    Hypertension    NASH (nonalcoholic steatohepatitis)    Personal history of radiation therapy 2008   Left Breast Cancer    Past Surgical History:  Procedure Laterality Date   ANKLE FUSION Left 2009   APPENDECTOMY  2009   BREAST LUMPECTOMY Left 2008   CATARACT EXTRACTION, BILATERAL     COLONOSCOPY     ESOPHAGOGASTRODUODENOSCOPY     REDUCTION MAMMAPLASTY Right 2014   ROTATOR CUFF REPAIR Left 2011   SPLENECTOMY  1973   TONSILLECTOMY     TOTAL KNEE ARTHROPLASTY Right 01/14/2023   Procedure: TOTAL KNEE ARTHROPLASTY;  Surgeon: Lorelle Hussar, MD;  Location: [facility];  Service: Orthopedics;  Laterality: Right;   TOTAL SHOULDER REPLACEMENT Right 2019    Family History  Problem Relation Age of Onset   Rheum arthritis Mother    Alcohol  abuse Father    Liver disease Father    Social History   Tobacco Use   Smoking status: Former    Current packs/day: 0.00    Types: Cigarettes    Quit date: 2008    Years since quitting: 17.5   Smokeless tobacco: Never  Vaping Use   Vaping status: Never Used  Substance Use Topics   Alcohol  use: Yes    Alcohol /week: 2.0 standard drinks of alcohol     Types: 2 Glasses of wine per week   Drug use: Never    Allergies  Allergen Reactions    Quetiapine Rash   Alendronate  Other (See Comments)    dyspepsia   Atorvastatin Other (See Comments)    myalgia    Codeine  Itching and Nausea Only   Pravastatin Other (See Comments)    myalgias   Risedronate Other (See Comments)    dyspepsia   Hydrocodone-Acetaminophen     Amitriptyline  Rash    Current Meds  Medication Sig   Accu-Chek FastClix Lancets MISC 1 each by Other route daily.   ACCU-CHEK GUIDE test strip USE 1 DAILY.   acetaminophen  (TYLENOL ) 500 MG tablet Take 2 tablets (1,000 mg total) by mouth every 8 (eight) hours.   aspirin EC 81 MG tablet Take 81 mg by mouth daily. Swallow whole.   cromolyn (OPTICROM) 4 % ophthalmic solution Place 1 drop into both eyes in the morning and at bedtime.   docusate sodium  (COLACE) 100 MG capsule Take 1 capsule (100 mg total) by mouth 2 (two) times daily.   Famotidine (PEPCID PO) Take 20 mg by mouth daily.   fluticasone (FLONASE) 50 MCG/ACT nasal spray Place 1 spray into both nostrils 2 (two) times daily as needed for allergies or rhinitis.   lisinopril  (ZESTRIL ) 2.5 MG tablet Take 1 tablet (2.5 mg total) by mouth daily.   LORazepam (ATIVAN) 2 MG tablet Take 2 mg by mouth daily as needed.   LYSINE PO Take 1,000 mg by mouth daily.   MAGNESIUM  PO Take 550 mg by mouth daily.   meloxicam  (MOBIC ) 15 MG tablet Take 15 mg by mouth daily.   metFORMIN  (GLUCOPHAGE -XR) 750 MG 24 hr tablet TAKE 2 TABLETS EVERY DAY WITH BREAKFAST   Naltrexone 380 MG SUSR Take 2.5 mg by mouth daily.   Semaglutide,0.25 or 0.5MG /DOS, 2 MG/3ML SOPN Inject 1 mg into the skin once a week.   zolpidem  (AMBIEN ) 5 MG tablet Take 5 mg by mouth at bedtime as needed for sleep. Psych    Objective: BP 124/79   Ht 5' 4 (1.626 m)   Wt 144 lb (65.3 kg)   BMI 24.72 kg/m   Physical Exam:  General: Alert and oriented. and No acute distress. Gait: Normal gait.  Right elbow without deformity.  No redness.  Tenderness palpation over the lateral epicondyle.  Pain with resisted long  finger extension.  Pain with resisted wrist extension.  Mild tenderness within the triceps tendon.  Pain is not recreated with resisted elbow extension.  Fingers are warm and well-perfused.  Sensation intact throughout the right hand.  IMAGING: I personally ordered and reviewed the following images  Negative right elbow x-rays   New Medications:  Meds ordered this encounter  Medications   methylPREDNISolone  acetate (DEPO-MEDROL ) injection 40  mg      Oneil DELENA Horde, MD  11/25/2023 10:00 AM

## 2023-11-30 ENCOUNTER — Encounter: Payer: Self-pay | Admitting: Student in an Organized Health Care Education/Training Program

## 2023-11-30 ENCOUNTER — Ambulatory Visit
Attending: Student in an Organized Health Care Education/Training Program | Admitting: Student in an Organized Health Care Education/Training Program

## 2023-11-30 VITALS — BP 105/74 | HR 77 | Temp 97.2°F | Resp 16 | Ht 64.0 in | Wt 145.0 lb

## 2023-11-30 DIAGNOSIS — M47812 Spondylosis without myelopathy or radiculopathy, cervical region: Secondary | ICD-10-CM | POA: Diagnosis not present

## 2023-11-30 DIAGNOSIS — M542 Cervicalgia: Secondary | ICD-10-CM | POA: Diagnosis not present

## 2023-11-30 NOTE — Progress Notes (Signed)
 PROVIDER NOTE: Interpretation of information contained herein should be left to medically-trained personnel. Specific patient instructions are provided elsewhere under Patient Instructions section of medical record. This document was created in part using AI and STT-dictation technology, any transcriptional errors that may result from this process are unintentional.  Patient: Sharon Kaufman  Service: E/M   PCP: Joshua Cathryne BROCKS, MD (Inactive)  DOB: 09-Dec-1950  DOS: 11/30/2023  Provider: Wallie Sherry, MD  MRN: [id number]  Delivery: Face-to-face  Specialty: Interventional Pain Management  Type: Established Patient  Setting: Ambulatory outpatient facility  Specialty designation: 09  Referring Prov.: No ref. provider found  Location: Outpatient office facility       History of present illness (HPI) Sharon Kaufman, a 72 y.o. year old female, is here today because of her Cervical spondylosis [M47.812]. Sharon Kaufman's primary complain today is Neck Pain (Muscle in left neck running up the neck to head )  Pertinent problems: Sharon Kaufman does not have any pertinent problems on file.  Pain Assessment: Severity of Chronic pain is reported as a 6 /10. Location: Neck Left/denies. Onset: More than a month ago. Quality: Tender, Discomfort, Tightness, Constant, Throbbing. Timing: Constant. Modifying factor(s): procedure helped, NSAIDS or acetaminophen . Vitals:  height is 5' 4 (1.626 m) and weight is 145 lb (65.8 kg). Her temporal temperature is 97.2 F (36.2 C) (abnormal). Her blood pressure is 105/74 and her pulse is 77. Her respiration is 16 and oxygen saturation is 100%.  BMI: Estimated body mass index is 24.89 kg/m as calculated from the following:   Height as of this encounter: 5' 4 (1.626 m).   Weight as of this encounter: 145 lb (65.8 kg).  Last encounter: 10/14/2023. Last procedure: 11/10/2023.  Reason for encounter: PPE  Post-Procedure Evaluation   Procedure: Cervical Facet Medial Branch  Block(s) #1  Laterality: Left  Level: C3, C4, C5, and C6 Medial Branch Level(s). Injecting these levels blocks the C3-4, C4-5, and C5-6 cervical facet joints.  Imaging: Fluoroscopic guidance Anesthesia: Local anesthesia (1-2% Lidocaine ) Sedation: Minimal Sedation                       DOS: 11/10/2023  Performed by: Wallie Sherry, MD  Purpose: Diagnostic/Therapeutic Indications: Cervicalgia (cervical spine axial pain) severe enough to impact quality of life or function. 1. Cervical spondylosis   2. Cervical facet joint syndrome   3. Right elbow pain   4. Lateral epicondylitis of right elbow    NAS-11 Pain score:   Pre-procedure: 7 /10   Post-procedure: 2 /10     Effectiveness:  Initial hour after procedure: 10 %  Subsequent 4-6 hours post-procedure: 20 %  Analgesia past initial 6 hours: 80 % (pain is improved but continues to have muscle tightness/soreness in the muscle running up left side neck.  she is less stiff and headaches are improved.)  Ongoing improvement:  Analgesic:  80% Function: Sharon Kaufman reports improvement in function ROM: Sharon Kaufman reports improvement in ROM  Discussed the use of AI scribe software for clinical note transcription with the patient, who gave verbal consent to proceed.  History of Present Illness   Sharon Kaufman is a 73 year old female who presents for follow-up for pain management after nerve blocks as detailed above.  She experiences approximately 80% pain relief from the nerve blocks but notes persistent muscle tightness in the sternocleidomastoid area of her neck.  She received an injection in her elbow, which significantly improved her symptoms. An  x-ray did not show any arthritis. The injection has been beneficial after the initial pain subsided.  She has pain over her iliac crest, sometimes severe enough to prevent her from standing up straight. This pain began after a knee procedure. She has not engaged in stretching exercises as her  physical therapy appointment was canceled and not rescheduled.  She reports soreness in her right knee, persisting for nearly a year since her knee procedure, but describes the pain as not severe.  She experiences burning and tingling in her feet, particularly at night in her toes. This symptom is intermittent and not an everyday occurrence.        ROS  Constitutional: Denies any fever or chills Gastrointestinal: No reported hemesis, hematochezia, vomiting, or acute GI distress Musculoskeletal: Pain overlying right iliac crest Neurological: No reported episodes of acute onset apraxia, aphasia, dysarthria, agnosia, amnesia, paralysis, loss of coordination, or loss of consciousness  Medication Review  Accu-Chek FastClix Lancets, Alcaftadine, Famotidine, LORazepam, Lysine, Magnesium , Naltrexone, Semaglutide(0.25 or 0.5MG /DOS), Soothe XP Xtra Protection, acetaminophen , aspirin EC, cromolyn, docusate sodium , fluticasone, glucose blood, ketotifen, lisinopril , meloxicam , metFORMIN , and zolpidem   History Review  Allergy: Sharon Kaufman is allergic to quetiapine, alendronate , atorvastatin, codeine , pravastatin, risedronate, hydrocodone-acetaminophen , and amitriptyline . Drug: Sharon Kaufman  reports no history of drug use. Alcohol :  reports current alcohol  use of about 2.0 standard drinks of alcohol  per week. Tobacco:  reports that she quit smoking about 17 years ago. Her smoking use included cigarettes. She has never used smokeless tobacco. Social: Sharon Kaufman  reports that she quit smoking about 17 years ago. Her smoking use included cigarettes. She has never used smokeless tobacco. She reports current alcohol  use of about 2.0 standard drinks of alcohol  per week. She reports that she does not use drugs. Medical:  has a past medical history of Allergy, Anxiety, Arthritis, Breast cancer (HCC), Breast cancer, left (HCC) (2008), Diabetes mellitus without complication (HCC), Fibromyalgia, GERD (gastroesophageal  reflux disease), History of hepatitis C, Hyperlipidemia, Hypertension, NASH (nonalcoholic steatohepatitis), and Personal history of radiation therapy (2008). Surgical: Sharon Kaufman  has a past surgical history that includes Splenectomy (1973); Appendectomy (2009); Breast lumpectomy (Left, 2008); Ankle Fusion (Left, 2009); Rotator cuff repair (Left, 2011); Total shoulder replacement (Right, 2019); Reduction mammaplasty (Right, 2014); Tonsillectomy; Cataract extraction, bilateral; Colonoscopy; Esophagogastroduodenoscopy; and Total knee arthroplasty (Right, 01/14/2023). Family: family history includes Alcohol  abuse in her father; Liver disease in her father; Rheum arthritis in her mother.  Laboratory Chemistry Profile   Renal Lab Results  Component Value Date   BUN 14 01/15/2023   CREATININE 0.62 01/15/2023   BCR 30 (H) 04/28/2022   GFRNONAA >60 01/15/2023    Hepatic Lab Results  Component Value Date   AST 24 01/05/2023   ALT 21 01/05/2023   ALBUMIN 3.9 01/05/2023   ALKPHOS 47 01/05/2023    Electrolytes Lab Results  Component Value Date   NA 138 01/15/2023   K 4.2 01/15/2023   CL 104 01/15/2023   CALCIUM 9.2 01/15/2023   PHOS 2.7 (L) 12/02/2021    Bone Lab Results  Component Value Date   VD25OH 48.6 04/28/2022    Inflammation (CRP: Acute Phase) (ESR: Chronic Phase) No results found for: CRP, ESRSEDRATE, LATICACIDVEN       Note: Above Lab results reviewed.  Recent Imaging Review  DG Elbow Complete Right CLINICAL DATA:  Posterior right elbow pain for 8 months  EXAM: RIGHT ELBOW - COMPLETE 3+ VIEW  COMPARISON:  09/09/2022  FINDINGS: There is no evidence of  fracture, dislocation, or joint effusion. There is no evidence of significant arthropathy or other focal bone abnormality. Soft tissues are unremarkable.  IMPRESSION: No acute or significant finding by plain radiography.  Stable exam.  Electronically Signed   By: CHRISTELLA.  Shick M.D.   On: 11/28/2023 07:28 Note:  Reviewed        Physical Exam  Vitals: BP 105/74 (BP Location: Left Arm, Patient Position: Sitting, Cuff Size: Normal)   Pulse 77   Temp (!) 97.2 F (36.2 C) (Temporal)   Resp 16   Ht 5' 4 (1.626 m)   Wt 145 lb (65.8 kg)   SpO2 100%   BMI 24.89 kg/m  BMI: Estimated body mass index is 24.89 kg/m as calculated from the following:   Height as of this encounter: 5' 4 (1.626 m).   Weight as of this encounter: 145 lb (65.8 kg). Ideal: Ideal body weight: 54.7 kg (120 lb 9.5 oz) Adjusted ideal body weight: 59.1 kg (130 lb 5.7 oz) General appearance: Well nourished, well developed, and well hydrated. In no apparent acute distress Mental status: Alert, oriented x 3 (person, place, & time)       Respiratory: No evidence of acute respiratory distress Eyes: PERLA  Pain overlying right iliac crest Assessment   Diagnosis Status  1. Cervical spondylosis   2. Cervical facet joint syndrome   3. Cervicalgia    Controlled Controlled Controlled   Updated Problems: Problem  Cervical Facet Joint Syndrome  Cervicalgia    Plan of Care  Assessment and Plan    Cervical facet arthropathy status post diagnostic cervical facet medial branch nerve block with approximately 80% pain relief from nerve blocks, though residual sternocleidomastoid tightness persists. Advise warm compresses and massage to sternocleidomastoid.  Repeat diagnostic cervical facet medial branch nerve blocks if pain returns  Pain over iliac crest: DDx cluneal neuropathy, SI joint dysfunction Pain over the iliac crest, possibly musculoskeletal, began after knee surgery. Differential includes cluneal nerves or sacroiliac joint involvement. Send referral for physical therapy. Advise follow-up in 2-3 weeks if pain persists.  Elbow inflammation   Elbow inflammation has improved following injection. X-ray showed no arthritis, indicating soft tissue involvement rather than bone.  Burning and tingling in feet   Intermittent  burning and tingling in feet, particularly at night, were discussed. Consider Qutenza (capsaicin 8% patch) for nerve pain if symptoms worsen.       Orders:  Orders Placed This Encounter  Procedures   CERVICAL FACET (MEDIAL BRANCH NERVE BLOCK)     Standing Status:   Standing    Number of Occurrences:   2    Next Expected Occurrence:   02/18/2024    Expiration Date:   11/29/2024    Scheduling Instructions:     Procedure: Cervical Facet Medial Branch Block(s) #1      Laterality: Left      Level: C3, C4, C5, and C6 Medial Branch Level(s). Injecting these levels blocks the C3-4, C4-5, and C5-6 cervical facet joints.      Imaging: Fluoroscopic guidance     Anesthesia: Local anesthesia (1-2% Lidocaine )     Sedation: Minimal Sedation    Where will this procedure be performed?:   [facility]     Bilateral genicular nerve block, left suprascapular nerve block, Bilateral GN RFA 10/07/22; left suprascapular nerve block 10/04/2023, right elbow steroid injection 10/04/2023 Left C3, C4, C5, C6 cervical facet medial branch nerve block #1 11/10/2023     Return for patient will call to  schedule F2F appt prn.    Recent Visits Date Type Provider Dept  11/10/23 Procedure visit Marcelino Nurse, MD [facility]  10/14/23 Office Visit Marcelino Nurse, MD [facility]  10/04/23 Procedure visit Marcelino Nurse, MD [facility]  09/16/23 Office Visit Marcelino Nurse, MD [facility]  09/09/23 Office Visit Marcelino Nurse, MD [facility]  Showing recent visits within past 90 days and meeting all other requirements Today's Visits Date Type Provider Dept  11/30/23 Office Visit Marcelino Nurse, MD [facility]  Showing today's visits and meeting all other requirements Future Appointments No visits were found meeting these conditions. Showing future appointments within next 90 days and meeting all other requirements  I discussed the assessment and treatment  plan with the patient. The patient was provided an opportunity to ask questions and all were answered. The patient agreed with the plan and demonstrated an understanding of the instructions.  Patient advised to call back or seek an in-person evaluation if the symptoms or condition worsens.  Duration of encounter: .  Total time on encounter, as per AMA guidelines included both the face-to-face and non-face-to-face time personally spent by the physician and/or other qualified health care professional(s) on the day of the encounter (includes time in activities that require the physician or other qualified health care professional and does not include time in activities normally performed by clinical staff). Physician's time may include the following activities when performed: Preparing to see the patient (e.g., pre-charting review of records, searching for previously ordered imaging, lab work, and nerve conduction tests) Review of prior analgesic pharmacotherapies. Reviewing PMP Interpreting ordered tests (e.g., lab work, imaging, nerve conduction tests) Performing post-procedure evaluations, including interpretation of diagnostic procedures Obtaining and/or reviewing separately obtained history Performing a medically appropriate examination and/or evaluation Counseling and educating the patient/family/caregiver Ordering medications, tests, or procedures Referring and communicating with other health care professionals (when not separately reported) Documenting clinical information in the electronic or other health record Independently interpreting results (not separately reported) and communicating results to the patient/ family/caregiver Care coordination (not separately reported)  Note by: Nurse Marcelino, MD (TTS and AI technology used. I apologize for any typographical errors that were not detected and corrected.) Date: 11/30/2023; Time: 11:34 AM

## 2023-11-30 NOTE — Progress Notes (Signed)
 Safety precautions to be maintained throughout the outpatient stay will include: orient to surroundings, keep bed in low position, maintain call bell within reach at all times, provide assistance with transfer out of bed and ambulation.

## 2023-12-14 ENCOUNTER — Encounter: Payer: Self-pay | Admitting: Psychiatry

## 2023-12-14 ENCOUNTER — Ambulatory Visit: Payer: Self-pay | Admitting: Psychiatry

## 2023-12-14 VITALS — BP 124/82 | HR 76 | Temp 98.0°F | Ht 64.0 in | Wt 140.0 lb

## 2023-12-14 DIAGNOSIS — M6520 Calcific tendinitis, unspecified site: Secondary | ICD-10-CM | POA: Insufficient documentation

## 2023-12-14 DIAGNOSIS — M1811 Unilateral primary osteoarthritis of first carpometacarpal joint, right hand: Secondary | ICD-10-CM | POA: Insufficient documentation

## 2023-12-14 DIAGNOSIS — Z79899 Other long term (current) drug therapy: Secondary | ICD-10-CM | POA: Diagnosis not present

## 2023-12-14 DIAGNOSIS — F5101 Primary insomnia: Secondary | ICD-10-CM | POA: Diagnosis not present

## 2023-12-14 DIAGNOSIS — F411 Generalized anxiety disorder: Secondary | ICD-10-CM | POA: Diagnosis not present

## 2023-12-14 DIAGNOSIS — M25572 Pain in left ankle and joints of left foot: Secondary | ICD-10-CM | POA: Insufficient documentation

## 2023-12-14 DIAGNOSIS — Z969 Presence of functional implant, unspecified: Secondary | ICD-10-CM | POA: Insufficient documentation

## 2023-12-14 DIAGNOSIS — M79644 Pain in right finger(s): Secondary | ICD-10-CM | POA: Insufficient documentation

## 2023-12-14 DIAGNOSIS — Z4789 Encounter for other orthopedic aftercare: Secondary | ICD-10-CM | POA: Insufficient documentation

## 2023-12-14 NOTE — Patient Instructions (Signed)
 Insomnia Insomnia is a sleep disorder that makes it difficult to fall asleep or stay asleep. Insomnia can cause fatigue, low energy, difficulty concentrating, mood swings, and poor performance at work or school. There are three different ways to classify insomnia: Difficulty falling asleep. Difficulty staying asleep. Waking up too early in the morning. Any type of insomnia can be long-term (chronic) or short-term (acute). Both are common. Short-term insomnia usually lasts for 3 months or less. Chronic insomnia occurs at least three times a week for longer than 3 months. What are the causes? Insomnia may be caused by another condition, situation, or substance, such as: Having certain mental health conditions, such as anxiety and depression. Using caffeine, alcohol, tobacco, or drugs. Having gastrointestinal conditions, such as gastroesophageal reflux disease (GERD). Having certain medical conditions. These include: Asthma. Alzheimer's disease. Stroke. Chronic pain. An overactive thyroid gland (hyperthyroidism). Other sleep disorders, such as restless legs syndrome and sleep apnea. Menopause. Sometimes, the cause of insomnia may not be known. What increases the risk? Risk factors for insomnia include: Gender. Females are affected more often than males. Age. Insomnia is more common as people get older. Stress and certain medical and mental health conditions. Lack of exercise. Having an irregular work schedule. This may include working night shifts and traveling between different time zones. What are the signs or symptoms? If you have insomnia, the main symptom is having trouble falling asleep or having trouble staying asleep. This may lead to other symptoms, such as: Feeling tired or having low energy. Feeling nervous about going to sleep. Not feeling rested in the morning. Having trouble concentrating. Feeling irritable, anxious, or depressed. How is this diagnosed? This condition  may be diagnosed based on: Your symptoms and medical history. Your health care provider may ask about: Your sleep habits. Any medical conditions you have. Your mental health. A physical exam. How is this treated? Treatment for insomnia depends on the cause. Treatment may focus on treating an underlying condition that is causing the insomnia. Treatment may also include: Medicines to help you sleep. Counseling or therapy. Lifestyle adjustments to help you sleep better. Follow these instructions at home: Eating and drinking  Limit or avoid alcohol, caffeinated beverages, and products that contain nicotine and tobacco, especially close to bedtime. These can disrupt your sleep. Do not eat a large meal or eat spicy foods right before bedtime. This can lead to digestive discomfort that can make it hard for you to sleep. Sleep habits  Keep a sleep diary to help you and your health care provider figure out what could be causing your insomnia. Write down: When you sleep. When you wake up during the night. How well you sleep and how rested you feel the next day. Any side effects of medicines you are taking. What you eat and drink. Make your bedroom a dark, comfortable place where it is easy to fall asleep. Put up shades or blackout curtains to block light from outside. Use a white noise machine to block noise. Keep the temperature cool. Limit screen use before bedtime. This includes: Not watching TV. Not using your smartphone, tablet, or computer. Stick to a routine that includes going to bed and waking up at the same times every day and night. This can help you fall asleep faster. Consider making a quiet activity, such as reading, part of your nighttime routine. Try to avoid taking naps during the day so that you sleep better at night. Get out of bed if you are still awake after  15 minutes of trying to sleep. Keep the lights down, but try reading or doing a quiet activity. When you feel  sleepy, go back to bed. General instructions Take over-the-counter and prescription medicines only as told by your health care provider. Exercise regularly as told by your health care provider. However, avoid exercising in the hours right before bedtime. Use relaxation techniques to manage stress. Ask your health care provider to suggest some techniques that may work well for you. These may include: Breathing exercises. Routines to release muscle tension. Visualizing peaceful scenes. Make sure that you drive carefully. Do not drive if you feel very sleepy. Keep all follow-up visits. This is important. Contact a health care provider if: You are tired throughout the day. You have trouble in your daily routine due to sleepiness. You continue to have sleep problems, or your sleep problems get worse. Get help right away if: You have thoughts about hurting yourself or someone else. Get help right away if you feel like you may hurt yourself or others, or have thoughts about taking your own life. Go to your nearest emergency room or: Call 911. Call the National Suicide Prevention Lifeline at (906)021-1611 or 988. This is open 24 hours a day. Text the Crisis Text Line at 325-069-2793. Summary Insomnia is a sleep disorder that makes it difficult to fall asleep or stay asleep. Insomnia can be long-term (chronic) or short-term (acute). Treatment for insomnia depends on the cause. Treatment may focus on treating an underlying condition that is causing the insomnia. Keep a sleep diary to help you and your health care provider figure out what could be causing your insomnia. This information is not intended to replace advice given to you by your health care provider. Make sure you discuss any questions you have with your health care provider. Document Revised: 04/21/2021 Document Reviewed: 04/21/2021 Elsevier Patient Education  2024 [employer].
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
## 2023-12-15 ENCOUNTER — Other Ambulatory Visit
Admission: RE | Admit: 2023-12-15 | Discharge: 2023-12-15 | Disposition: A | Discharge status: Home / Self Care | Source: Ambulatory Visit | Attending: Psychiatry | Admitting: Psychiatry

## 2023-12-15 DIAGNOSIS — Z79899 Other long term (current) drug therapy: Secondary | ICD-10-CM | POA: Insufficient documentation

## 2023-12-17 ENCOUNTER — Ambulatory Visit: Payer: Self-pay | Admitting: Psychiatry

## 2023-12-17 DIAGNOSIS — F411 Generalized anxiety disorder: Secondary | ICD-10-CM

## 2023-12-17 LAB — URINE DRUGS OF ABUSE SCREEN W ALC, ROUTINE (REF LAB)
Amphetamines, Urine: NEGATIVE ng/mL
Barbiturate, Ur: NEGATIVE ng/mL
Benzodiazepine Quant, Ur: NEGATIVE ng/mL
Cannabinoid Quant, Ur: NEGATIVE ng/mL
Cocaine (Metab.): NEGATIVE ng/mL
Creatinine, Urine: 135.9 mg/dL (ref 20.0–300.0)
Ethanol U, Quan: NEGATIVE %
Methadone Screen, Urine: NEGATIVE ng/mL
Nitrite Urine, Quantitative: NEGATIVE ug/mL
OPIATE SCREEN URINE: NEGATIVE ng/mL
Phencyclidine, Ur: NEGATIVE ng/mL
Propoxyphene, Urine: NEGATIVE ng/mL
pH, Urine: 6.3 (ref 4.5–8.9)

## 2023-12-17 MED ORDER — LORAZEPAM 0.5 MG PO TABS: 0.5000 mg | ORAL_TABLET | ORAL | 1 refills | Status: AC

## 2023-12-17 NOTE — Telephone Encounter (Signed)
 I have reviewed urine drug screen.  Negative for drugs tested.  I have sent lorazepam to pharmacy.

## 2023-12-20 NOTE — Telephone Encounter (Signed)
-----   Message from Maryellen Barth, MD sent at 12/17/2023  1:07 PM EDT -----  Please let patient know. ----- Message ----- From: Rebecka, Lab In [place] Sent: 12/17/2023  11:36 AM EDT To: Saramma Eappen, MD

## 2023-12-20 NOTE — Telephone Encounter (Signed)
 call pt to make sure that she got her mychart message about labwork and the medication being sent to the pharmacy. told her that it was done after hours on friday so i was just calling to make sure she got the message. she stated that she did and she thanked me for check up on it.

## 2024-02-14 ENCOUNTER — Other Ambulatory Visit: Payer: Self-pay | Admitting: Internal Medicine

## 2024-02-14 DIAGNOSIS — Z1231 Encounter for screening mammogram for malignant neoplasm of breast: Secondary | ICD-10-CM

## 2024-02-16 IMAGING — CR DG SHOULDER 2+V*L*
3 series · 3 of 3 positions shown · non-contrast
Comparison: None.

CLINICAL DATA: Pain

EXAM:
LEFT SHOULDER - 2+ VIEW

[shoulder grashey]
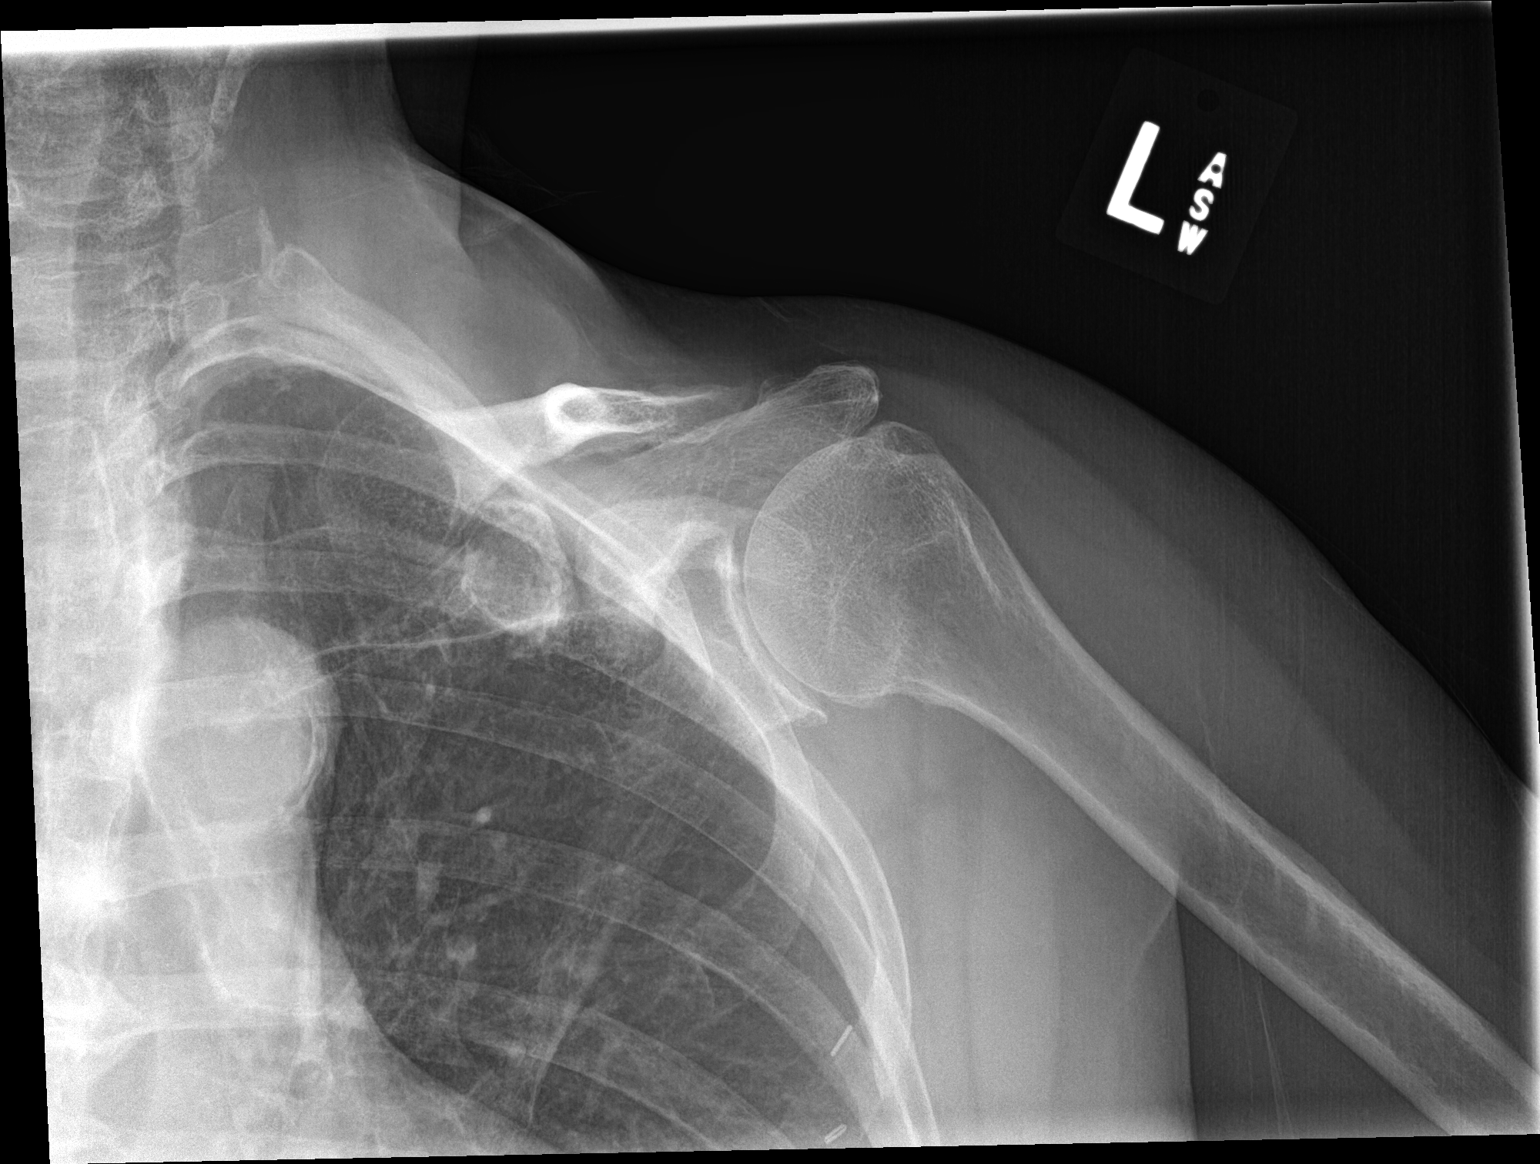

[shoulder y view]
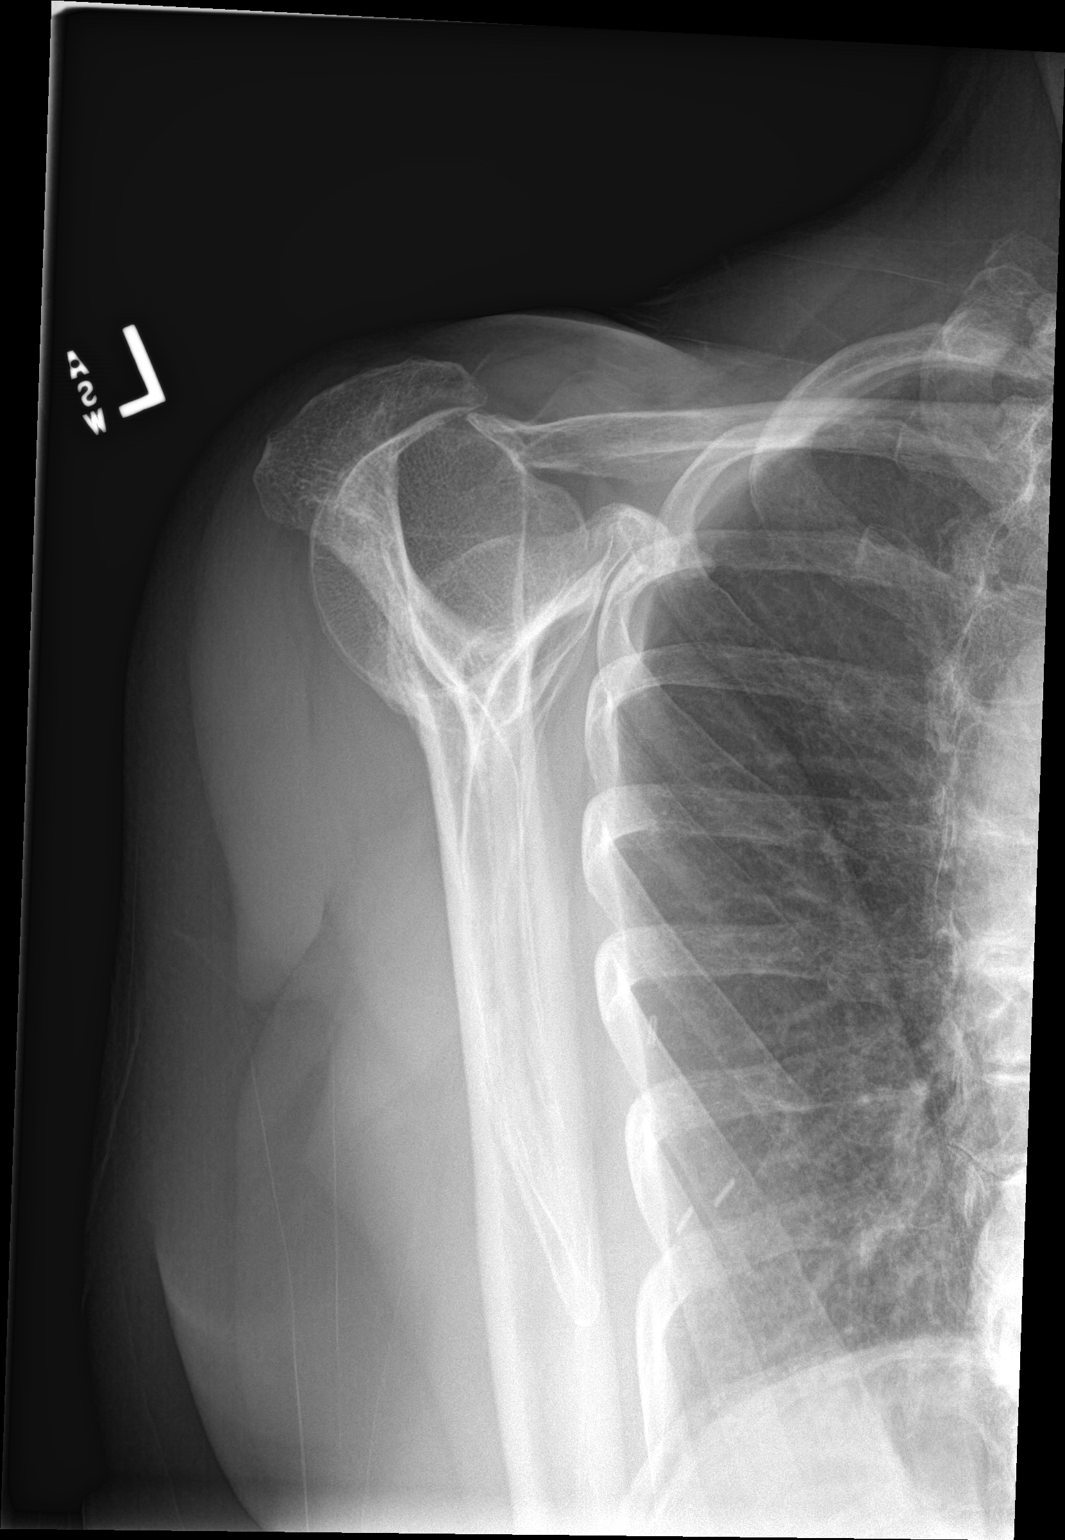

[shoulder axial]
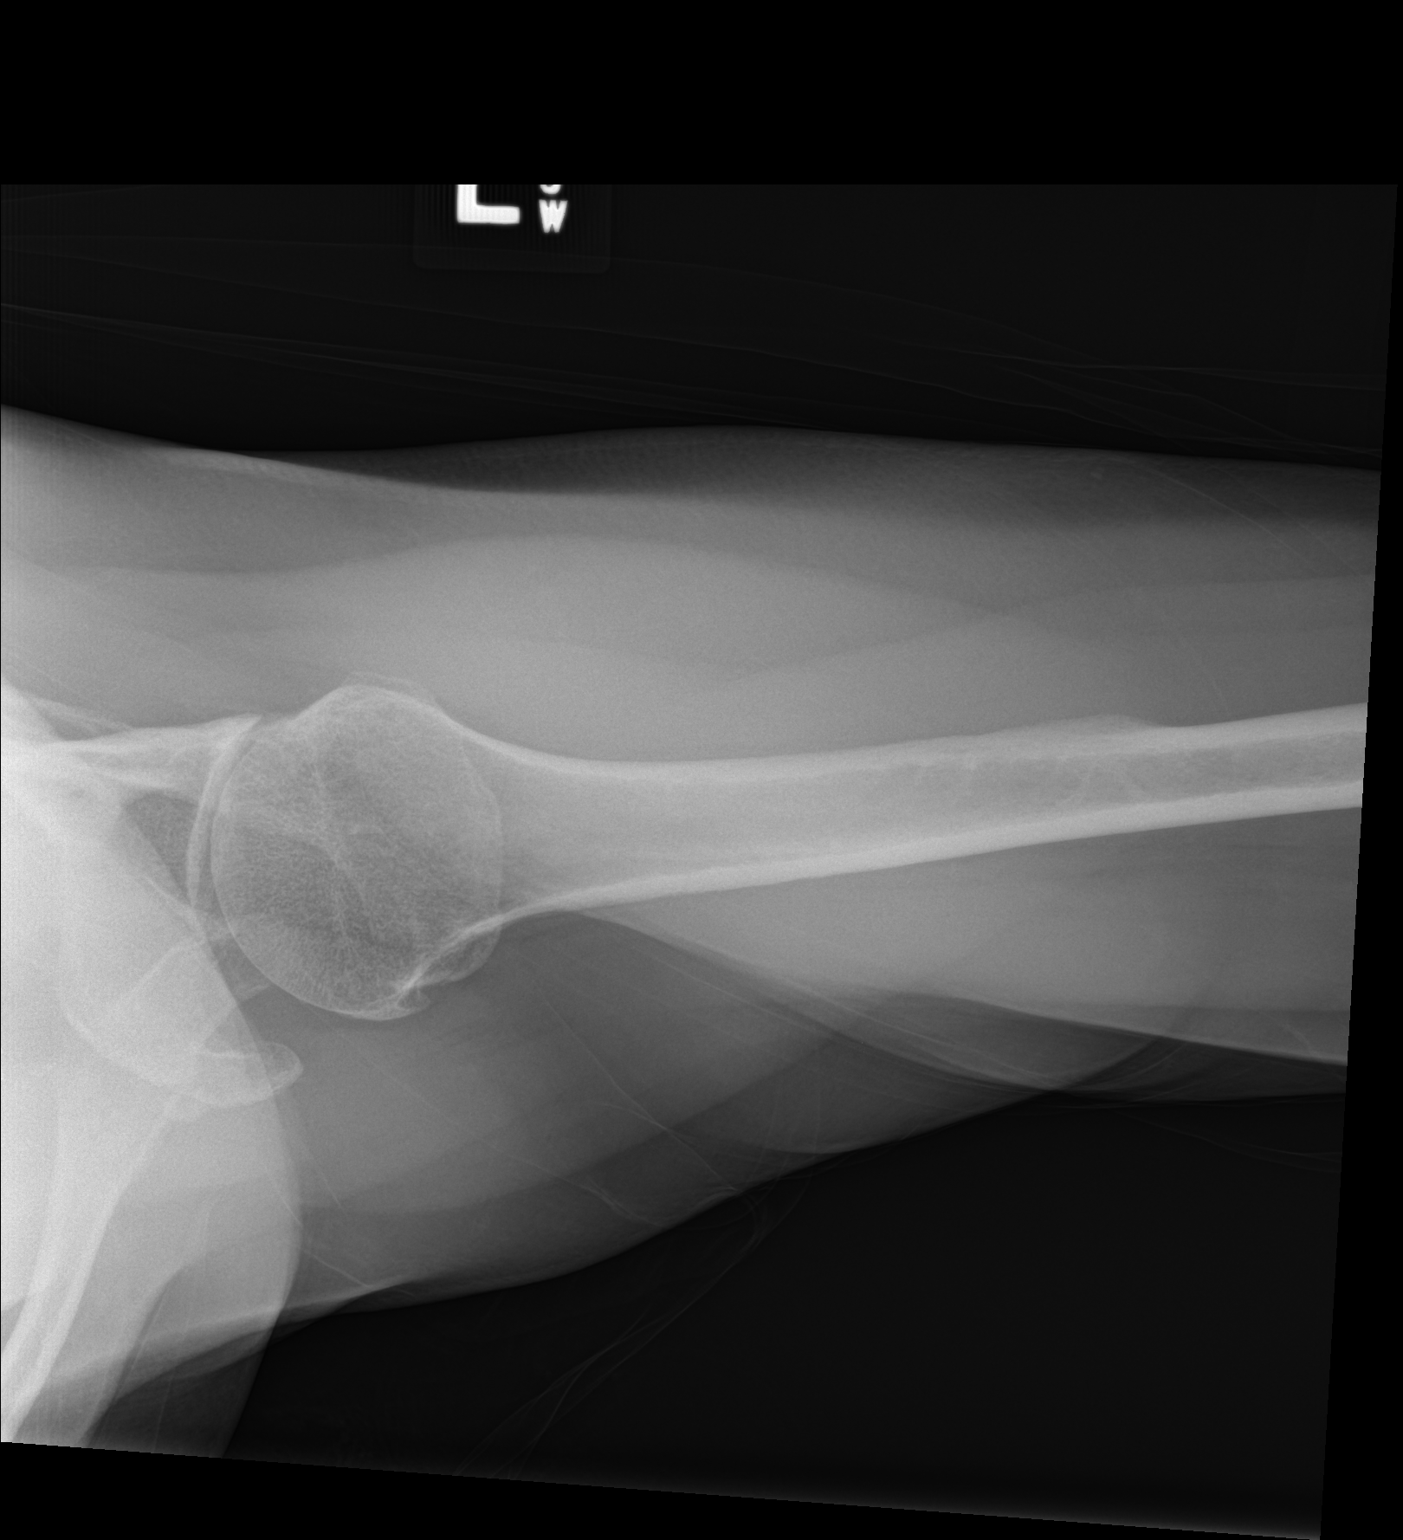

[3 of 3 positions shown; findings below may reference images not displayed]

FINDINGS: No recent fracture or dislocation is seen. There are no abnormal
soft tissue calcifications. Degenerative changes are noted in
glenohumeral joint with bony spurs. There is deformity of lateral
end of left clavicle which may be normal variation or residual
change from previous injury.
IMPRESSION: No recent fracture or dislocation is seen. Degenerative changes are
noted with bony spurs in the left shoulder joint.

## 2024-02-16 IMAGING — CR DG KNEE COMPLETE 4+V*R*
4 series · 4 of 4 positions shown · non-contrast
Comparison: None available.

CLINICAL DATA: Initial evaluation for right knee pain, history of
breast cancer.

EXAM:
RIGHT KNEE - COMPLETE 4+ VIEW

[knee ap]
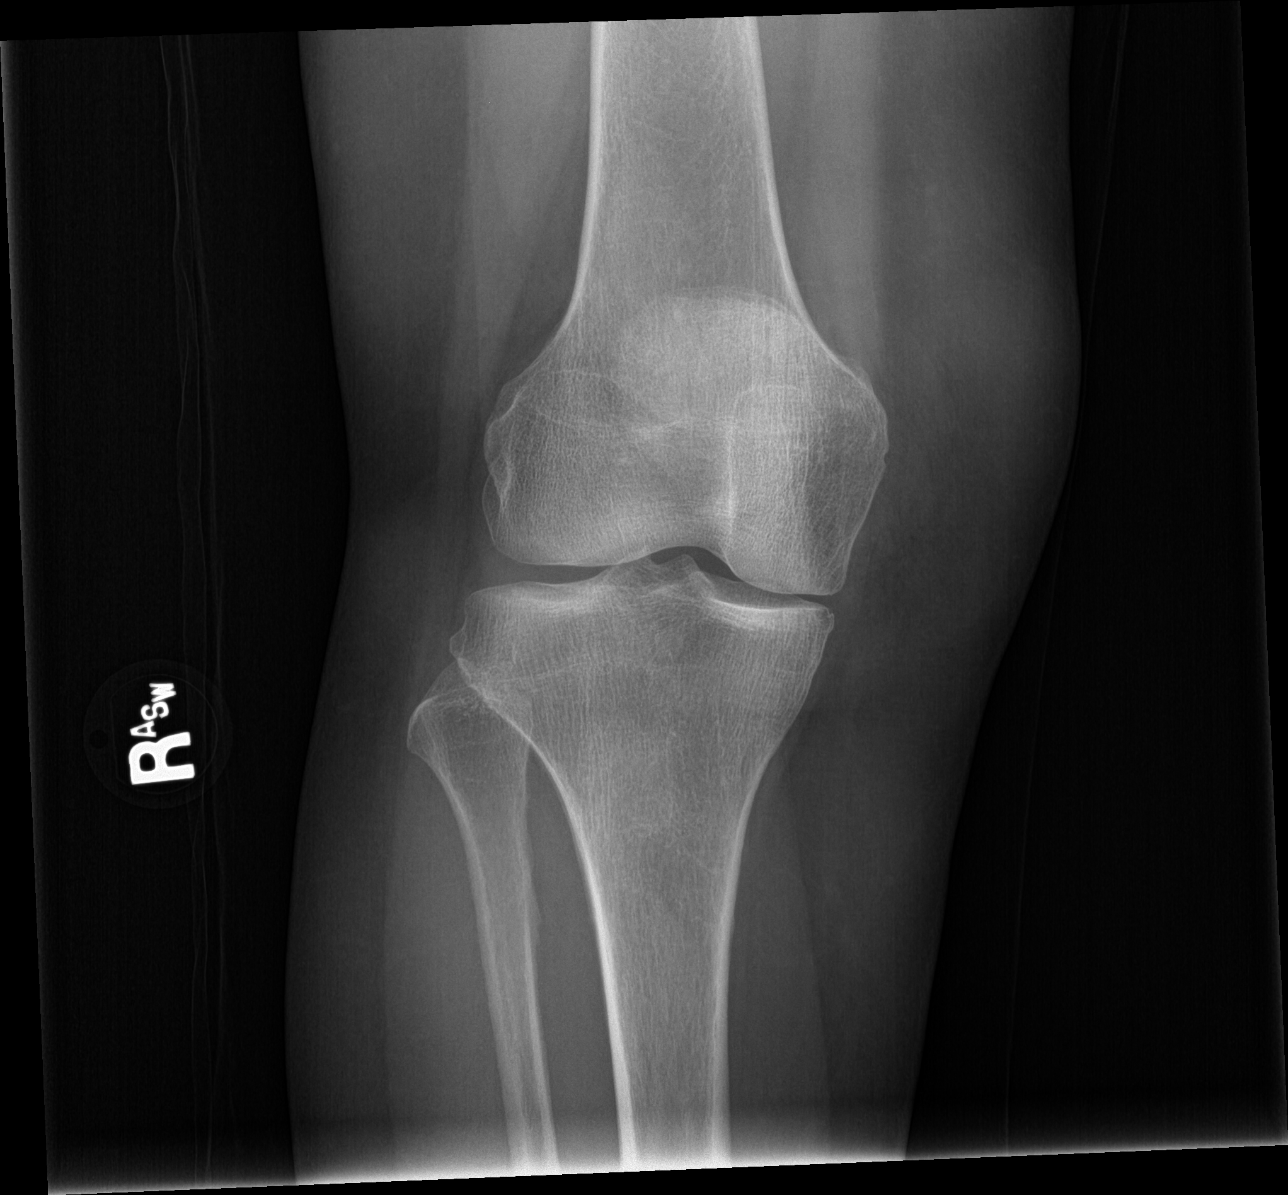

[knee lat]
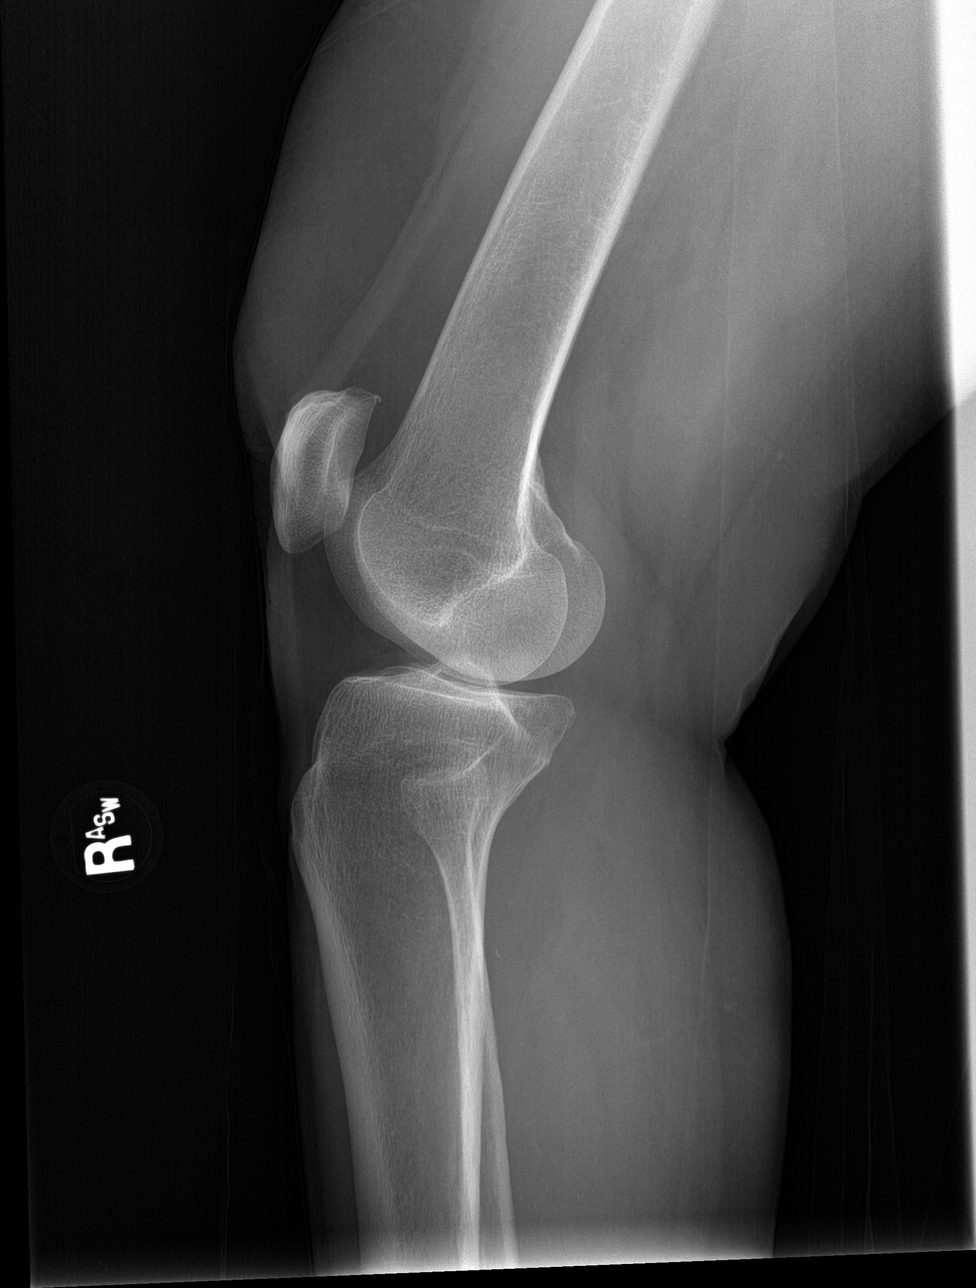

[tunnel]
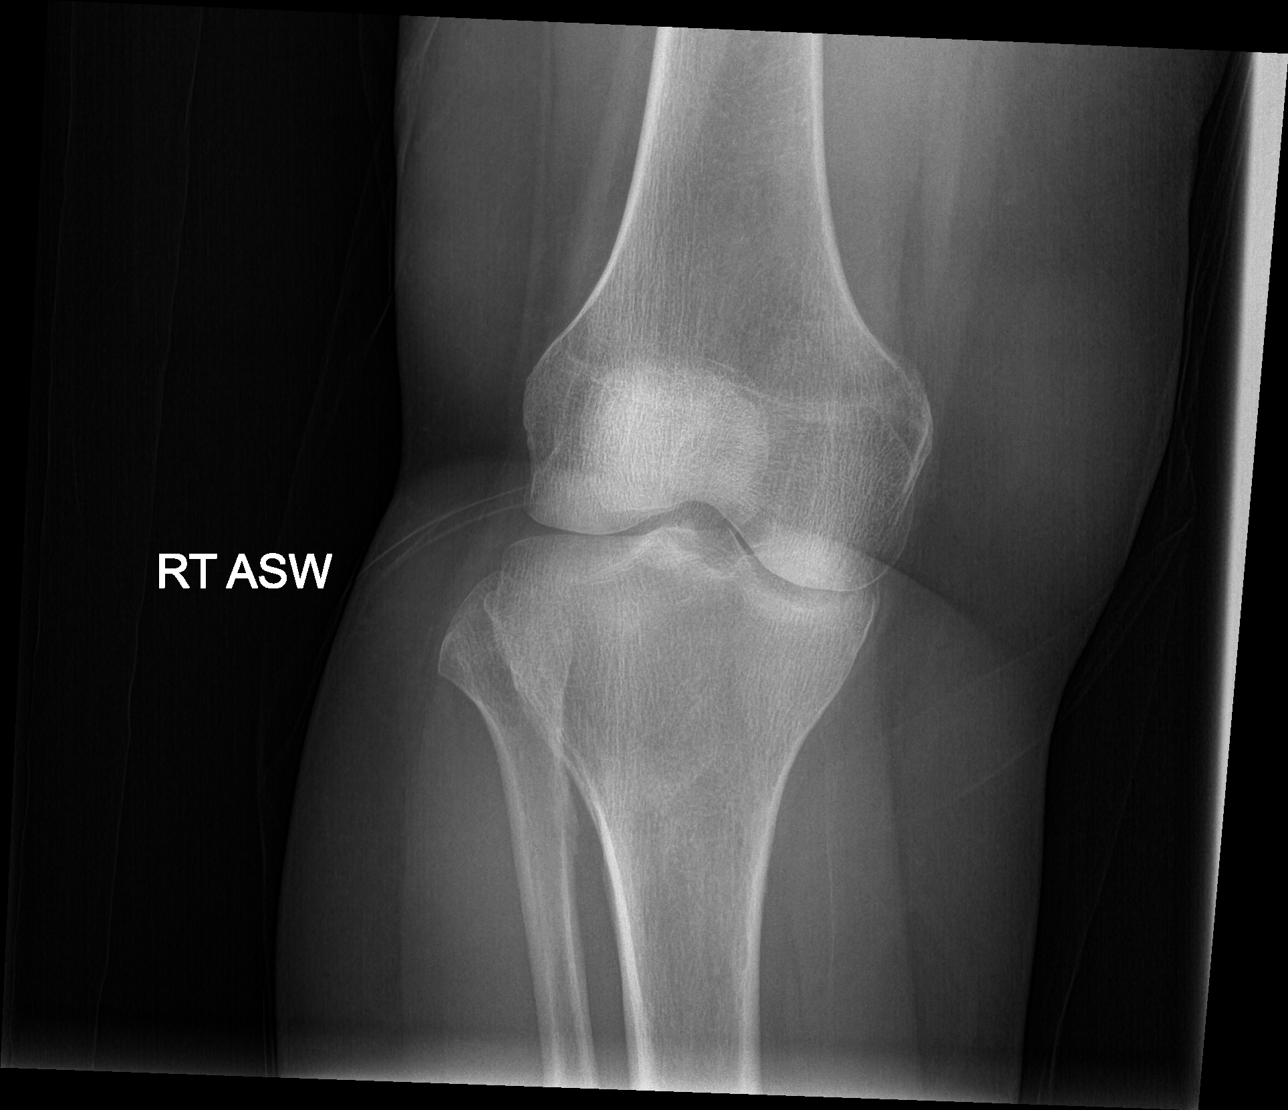

[patella skyline]
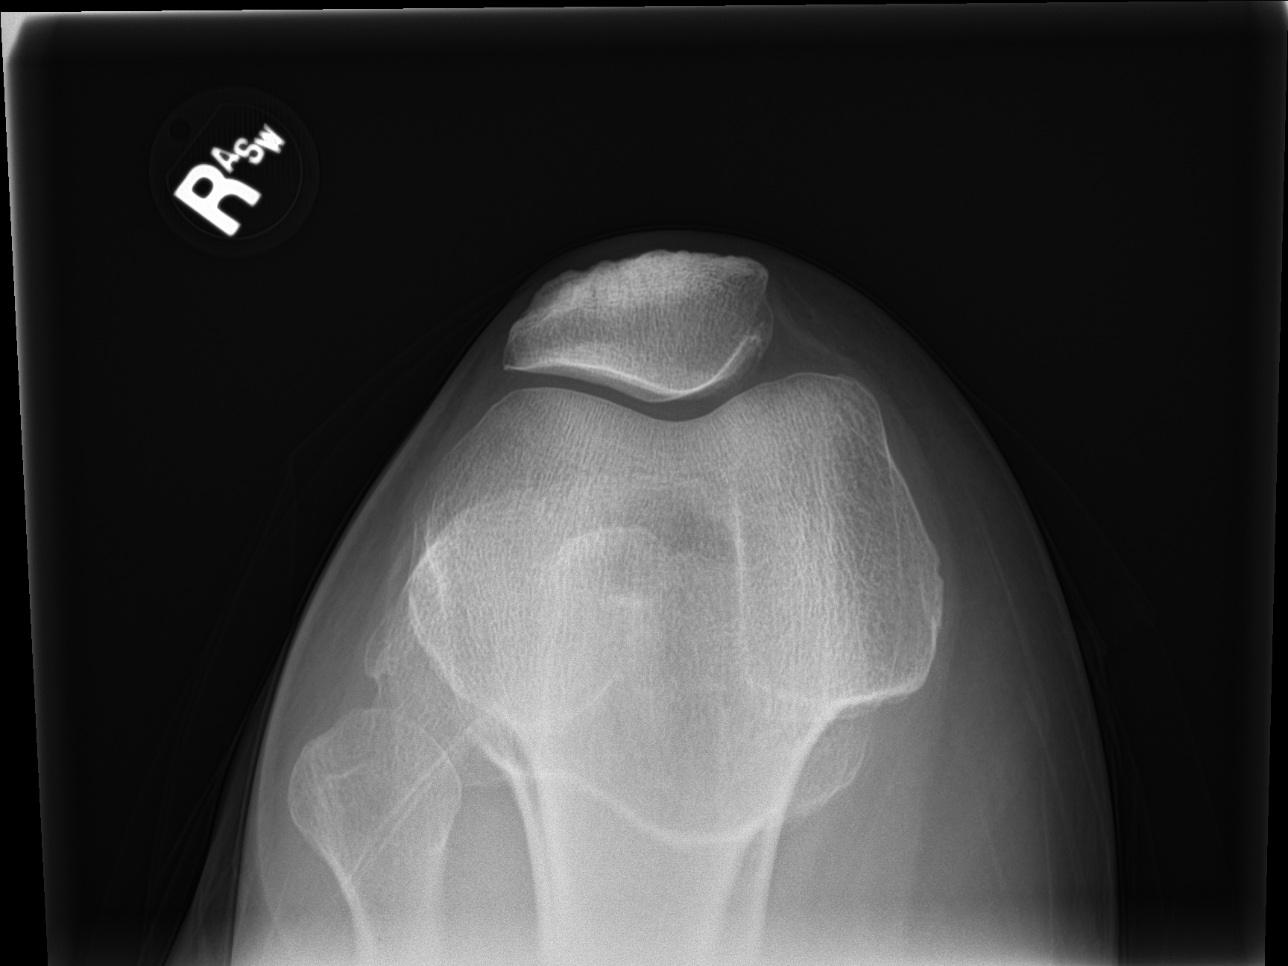

[4 of 4 positions shown; findings below may reference images not displayed]

FINDINGS: No acute fracture or dislocation. No joint effusion. Mild
juxta-articular osteoarthritic spurring present at the
patellofemoral and medial femorotibial joint space compartments.
Osseous mineralization normal. No focal osseous lesions. No soft
tissue abnormality.
IMPRESSION: 1. Mild for age degenerative osteoarthritic changes about the
patellofemoral and medial femorotibial joint space compartments.
2. No acute osseous finding.

## 2024-02-16 IMAGING — CR DG FOOT COMPLETE 3+V*R*
3 series · 3 of 3 positions shown · non-contrast
Comparison: None.

CLINICAL DATA: Pain right foot

EXAM:
RIGHT FOOT COMPLETE - 3+ VIEW

[foot ap]
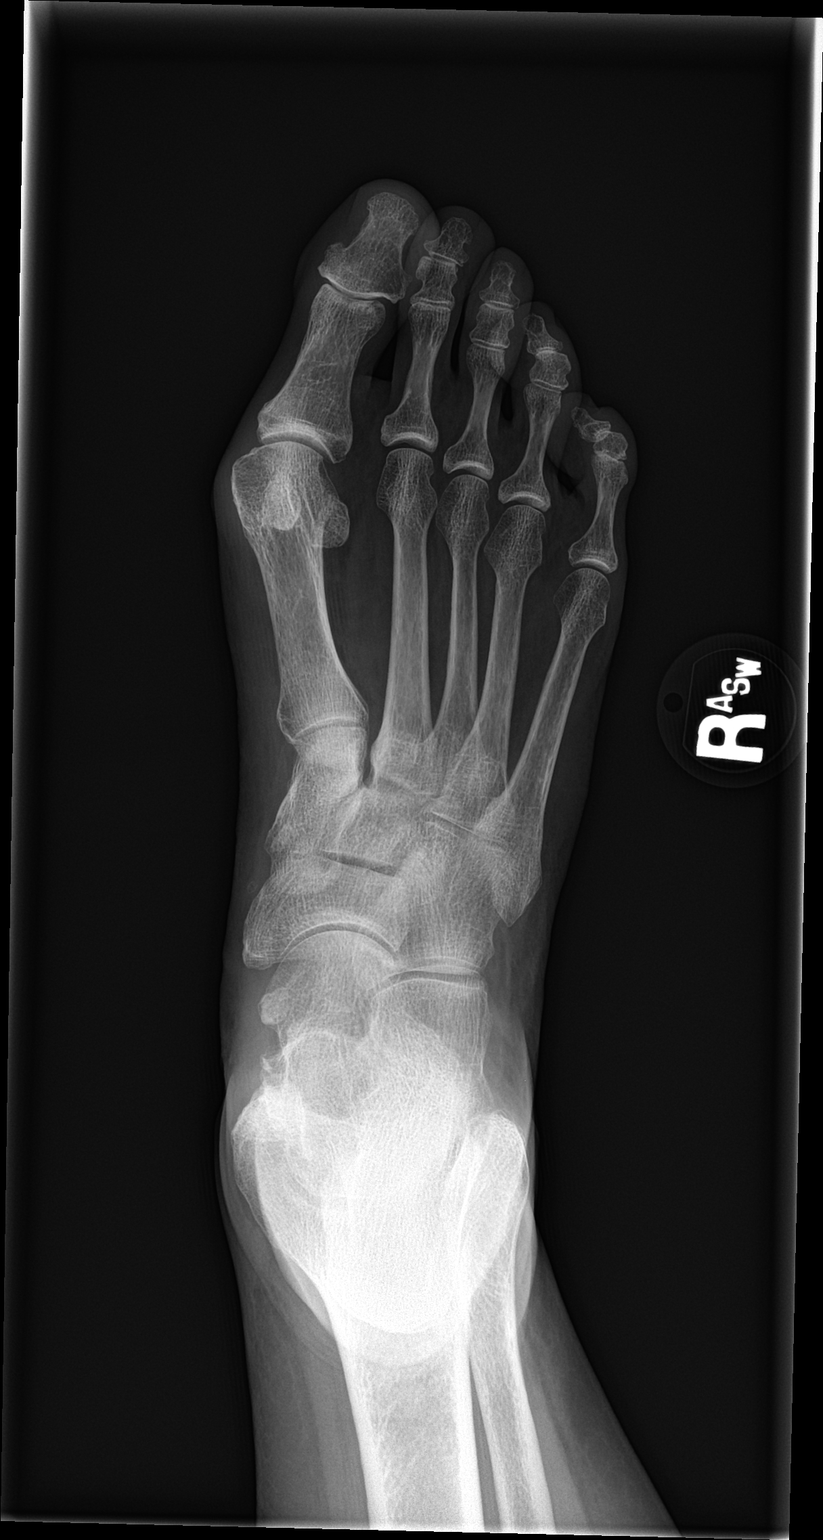

[foot obl]
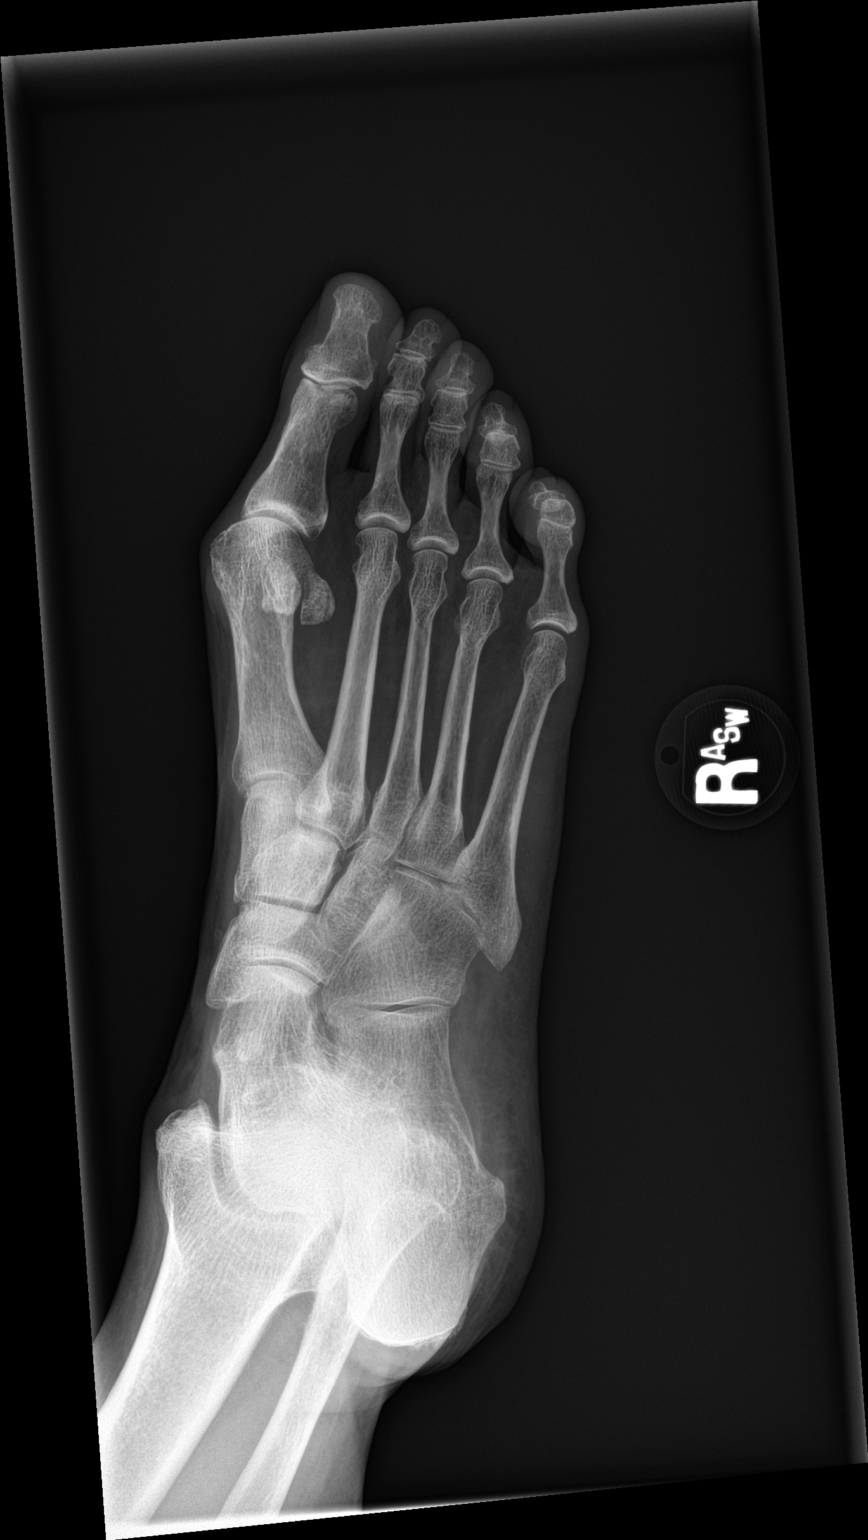

[foot lat]
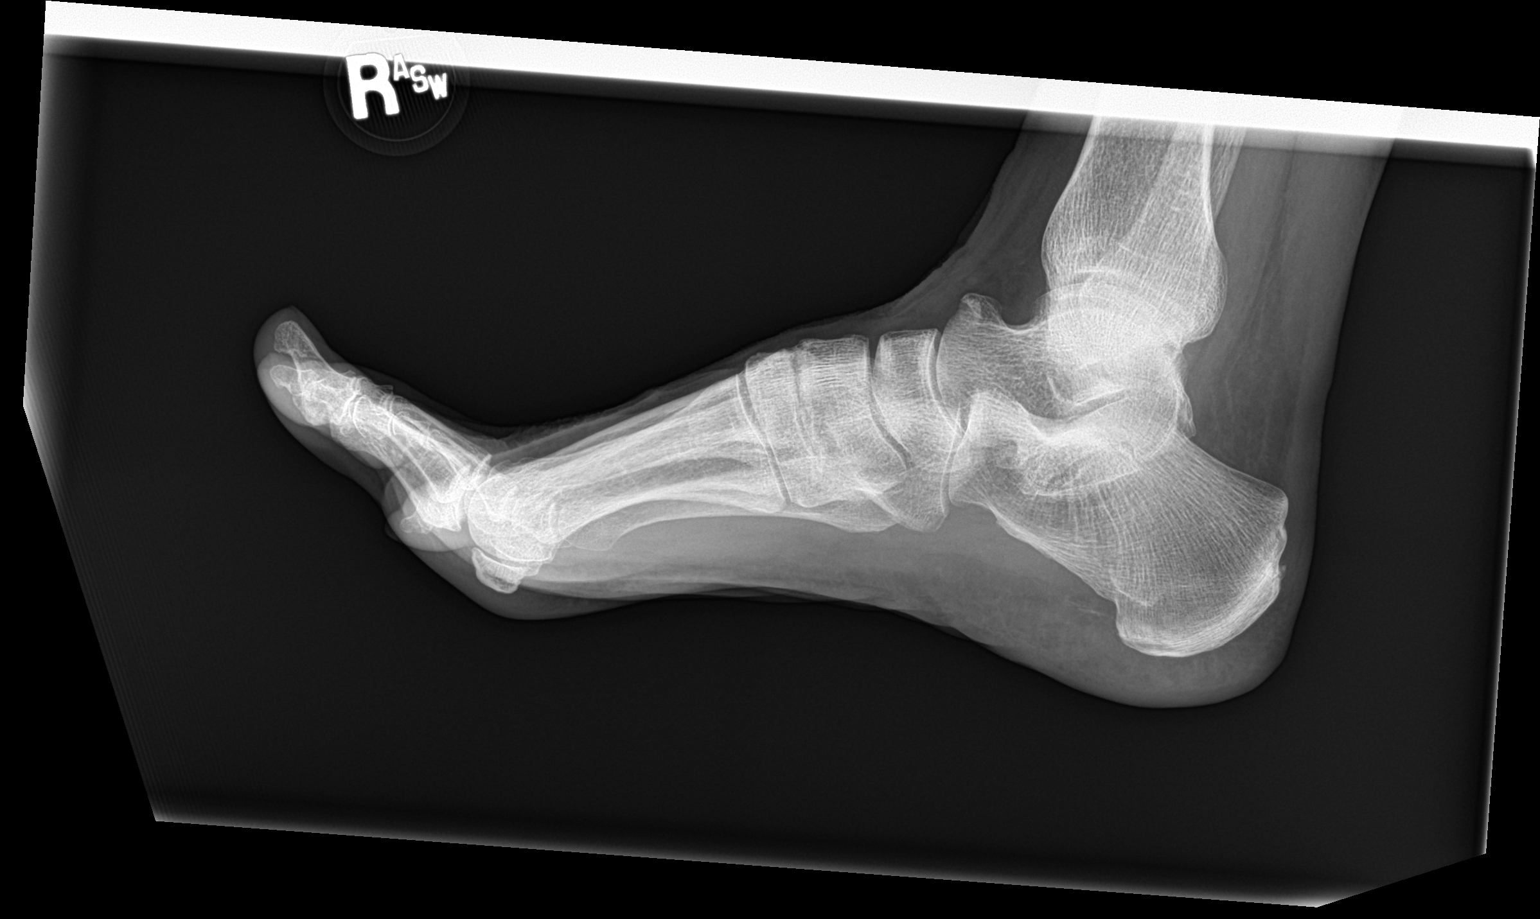

[3 of 3 positions shown; findings below may reference images not displayed]

FINDINGS: No fracture or dislocation is seen. Hallux valgus deformity is
noted. There is bony protrusion, possibly as bony spur in the dorsal
aspect of anterior talus. There is possible minimal soft tissue
calcification in the plantar fascia.
IMPRESSION: No recent fracture or dislocation is seen in the right foot. Hallux
valgus deformity is noted. Possible minimal calcifications are seen
in the plantar fascia suggesting possible chronic inflammation.

## 2024-02-22 ENCOUNTER — Telehealth: Payer: Self-pay

## 2024-02-22 DIAGNOSIS — F5101 Primary insomnia: Secondary | ICD-10-CM

## 2024-02-22 MED ORDER — ZOLPIDEM TARTRATE 5 MG PO TABS: 5.0000 mg | ORAL_TABLET | Freq: Every evening | ORAL | 1 refills | Status: DC | PRN

## 2024-02-22 NOTE — Telephone Encounter (Signed)
 Pt.notified

## 2024-02-22 NOTE — Telephone Encounter (Signed)
 pt left message that she needs refils on the ambien . pt was last seen on 7-22 next appt 10-22

## 2024-02-22 NOTE — Telephone Encounter (Signed)
 I have sent Ambien  to pharmacy at [facility] [place]

## 2024-03-15 ENCOUNTER — Ambulatory Visit: Admitting: Psychiatry

## 2024-03-27 ENCOUNTER — Encounter: Payer: Self-pay | Admitting: Radiology

## 2024-04-06 ENCOUNTER — Encounter

## 2024-04-27 ENCOUNTER — Other Ambulatory Visit: Payer: Self-pay

## 2024-04-27 ENCOUNTER — Encounter: Payer: Self-pay | Admitting: Psychiatry

## 2024-04-27 ENCOUNTER — Ambulatory Visit: Admitting: Psychiatry

## 2024-04-27 VITALS — BP 129/82 | HR 71 | Temp 97.5°F | Ht 63.5 in | Wt 134.4 lb

## 2024-04-27 DIAGNOSIS — F411 Generalized anxiety disorder: Secondary | ICD-10-CM | POA: Diagnosis not present

## 2024-04-27 DIAGNOSIS — F5101 Primary insomnia: Secondary | ICD-10-CM | POA: Diagnosis not present

## 2024-04-27 MED ORDER — LORAZEPAM 0.5 MG PO TABS: 0.2500 mg | ORAL_TABLET | ORAL | 1 refills | Status: AC

## 2024-04-27 MED ORDER — ZOLPIDEM TARTRATE 5 MG PO TABS: 5.0000 mg | ORAL_TABLET | Freq: Every evening | ORAL | 5 refills | Status: AC | PRN

## 2024-04-27 NOTE — Progress Notes (Signed)
 [facility] MD OP Progress Note  04/27/2024 4:02 PM Sharon Kaufman  MRN:  [id number]  Chief Complaint:  Chief Complaint  Patient presents with   Follow-up   Anxiety   Depression   Medication Refill   Discussed the use of AI scribe software for clinical note transcription with the patient, who gave verbal consent to proceed.  History of Present Illness Sharon Kaufman is a 73 year old Caucasian female, lives in [place], has a history of generalized anxiety disorder, insomnia, type 2 diabetes mellitus, essential hypertension, fibromyalgia, chronic fatigue, other cirrhosis of liver was evaluated in office today for a follow-up appointment.  Persistent anxiety manifests for her as constant tension and difficulty relaxing. She frequently worries about aging, her future, and what will happen when she is no longer able to work. 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 She identifies the loss of her mother in June and subsequent estrangement from her sister as sources of ongoing anxiety.  She reports sleep difficulties, including trouble falling and staying asleep, with her mind racing and worrying at night. She finds that zolpidem  (Ambien ) helps her sleep and recently picked up her last prescription. She also takes lorazepam  0.5 mg as needed, typically once a week or every few days. Both medications help with her anxiety and sleep, but she prefers not to take daily medications and expresses concern about the potential for lorazepam  to cause memory problems. She declines SSRIs and SNRIs, stating they have not been helpful in the past.  She denies experiencing current depressive symptoms such as inability to get out of bed or loss of interest in activities. She reports occasional low energy, especially for housework, and some days she  stays in bed to rest and relax with her cat.   Her daughter-in-law and granddaughter provide emotional support, and she maintains regular contact with her son and grandchildren, though she remains estranged from her sister.  She denies any suicidality, homicidality or perceptual disturbances.   Visit Diagnosis:    ICD-10-CM   1. GAD (generalized anxiety disorder)  F41.1 LORazepam  (ATIVAN ) 0.5 MG tablet    2. Primary insomnia  F51.01 zolpidem  (AMBIEN ) 5 MG tablet      Past Psychiatric History: Reviewed past psychiatric history from progress note on 12/14/2023.  Trials of medications multiple including BuSpar, fluoxetine , Seroquel, trazodone, mirtazapine and multiple others.  Past Medical History:  Past Medical History:  Diagnosis Date   Allergy    Anxiety    Arthritis    Breast cancer (HCC)    Breast cancer, left (HCC) 2008   Diabetes mellitus without complication (HCC)    Fibromyalgia    GERD (gastroesophageal reflux disease)    History of hepatitis C    reversed with medication   Hyperlipidemia    Hypertension    NASH (nonalcoholic steatohepatitis)    Personal history of radiation therapy 2008   Left Breast Cancer    Past Surgical History:  Procedure Laterality Date   ANKLE FUSION Left 2009   APPENDECTOMY  2009   BREAST LUMPECTOMY Left 2008   CATARACT EXTRACTION, BILATERAL     COLONOSCOPY     ESOPHAGOGASTRODUODENOSCOPY     REDUCTION MAMMAPLASTY Right 2014   ROTATOR CUFF REPAIR Left 2011   SPLENECTOMY  1973   TONSILLECTOMY     TOTAL KNEE ARTHROPLASTY Right 01/14/2023  Procedure: TOTAL KNEE ARTHROPLASTY;  Surgeon: Lorelle Hussar, MD;  Location: [facility] ORS;  Service: Orthopedics;  Laterality: Right;   TOTAL SHOULDER REPLACEMENT Right 2019    Family Psychiatric History: Reviewed family psychiatric history from progress note on 12/14/2023  Family History:  Family History  Problem Relation Age of Onset   Depression Mother    Rheum arthritis Mother    Alcohol  abuse  Father    Liver disease Father    Depression Sister     Social History: Reviewed social history from progress note on 12/14/2023 Social History   Socioeconomic History   Marital status: Widowed    Spouse name: Not on file   Number of children: 1   Years of education: 12+   Highest education level: 12th grade  Occupational History   Not on file  Tobacco Use   Smoking status: Former    Current packs/day: 0.00    Types: Cigarettes    Quit date: 2008    Years since quitting: 17.9   Smokeless tobacco: Never  Vaping Use   Vaping status: Never Used  Substance and Sexual Activity   Alcohol  use: Not Currently    Alcohol /week: 2.0 standard drinks of alcohol     Types: 2 Glasses of wine per week   Drug use: Never   Sexual activity: Not Currently  Other Topics Concern   Not on file  Social History Narrative   Not on file   Social Drivers of Health   Financial Resource Strain: Low Risk  (03/07/2024)   Received from [facility]   Overall Financial Resource Strain (CARDIA)    Difficulty of Paying Living Expenses: Not very hard  Food Insecurity: No Food Insecurity (03/07/2024)   Received from [facility]   Hunger Vital Sign    Within the past 12 months, you worried that your food would run out before you got the money to buy more.: Never true    Within the past 12 months, the food you bought just didn't last and you didn't have money to get more.: Never true  Transportation Needs: No Transportation Needs (03/07/2024)   Received from [facility]   PRAPARE - Transportation    Lack of Transportation (Non-Medical): No    In the past 12 months, has lack of transportation kept you from medical appointments or from getting medications?: No  Physical Activity: Unknown (11/12/2022)   Exercise Vital Sign    Days of Exercise per Week: Patient declined    Minutes of Exercise per Session: Not on file  Stress: Stress Concern Present  (11/12/2022)   [employer] of Occupational Health - Occupational Stress Questionnaire    Feeling of Stress : To some extent  Social Connections: Socially Isolated (11/12/2022)   Social Connection and Isolation Panel    Frequency of Communication with Friends and Family: Once a week    Frequency of Social Gatherings with Friends and Family: Once a week    Attends Religious Services: 1 to 4 times per year    Active Member of [employer] or Organizations: No    Attends Banker Meetings: Not on file    Marital Status: Widowed    Allergies:  Allergies  Allergen Reactions   Quetiapine Rash   Alendronate  Other (See Comments)    dyspepsia   Atorvastatin Other (See Comments)    myalgia    Codeine  Itching and Nausea Only   Pravastatin Other (See Comments)    myalgias   Risedronate  Other (See Comments)    dyspepsia   Hydrocodone-Acetaminophen     Amitriptyline  Rash    Metabolic Disorder Labs: Lab Results  Component Value Date   HGBA1C 5.9 (H) 01/02/2022   No results found for: PROLACTIN Lab Results  Component Value Date   CHOL 216 (H) 01/02/2022   TRIG 76 01/02/2022   HDL 80 01/02/2022   LDLCALC 123 (H) 01/02/2022   LDLCALC 149 (H) 10/09/2021   No results found for: TSH  Therapeutic Level Labs: No results found for: LITHIUM No results found for: VALPROATE No results found for: CBMZ  Current Medications: Current Outpatient Medications  Medication Sig Dispense Refill   Naltrexone HCl, Pain, (NALTREX) 4.5 MG CAPS Take 4.5 mg by mouth.     Accu-Chek FastClix Lancets MISC 1 each by Other route daily.     ACCU-CHEK GUIDE test strip USE 1 DAILY. 100 strip 1   acetaminophen  (TYLENOL ) 500 MG tablet Take 2 tablets (1,000 mg total) by mouth every 8 (eight) hours. 30 tablet 0   Alcaftadine (LASTACAFT) 0.25 % SOLN Apply 1-2 drops to eye 2 (two) times daily as needed.     Artificial Tear Solution (SOOTHE XP XTRA PROTECTION) SOLN Apply 2 drops to eye daily as  needed (both eyes).     aspirin EC 81 MG tablet Take 81 mg by mouth daily. Swallow whole.     cromolyn (OPTICROM) 4 % ophthalmic solution Place 1 drop into both eyes in the morning and at bedtime.     docusate sodium  (COLACE) 100 MG capsule Take 1 capsule (100 mg total) by mouth 2 (two) times daily. 10 capsule 0   Famotidine (PEPCID PO) Take 20 mg by mouth daily.     fluticasone (FLONASE) 50 MCG/ACT nasal spray Place 1 spray into both nostrils 2 (two) times daily as needed for allergies or rhinitis.     ketotifen (ZADITOR) 0.035 % ophthalmic solution Place 1 drop into both eyes 2 (two) times daily as needed.     lisinopril  (ZESTRIL ) 2.5 MG tablet Take 1 tablet (2.5 mg total) by mouth daily. 100 tablet 0   LORazepam  (ATIVAN ) 0.5 MG tablet Take 0.5-1 tablets (0.25-0.5 mg total) by mouth as directed. Take 2-3 times a week only for severe anxiety attacks Please limit use 15 tablet 1   LYSINE PO Take 1,000 mg by mouth daily.     MAGNESIUM  PO Take 550 mg by mouth daily.     metFORMIN  (GLUCOPHAGE -XR) 750 MG 24 hr tablet TAKE 2 TABLETS EVERY DAY WITH BREAKFAST 180 tablet 0   Naltrexone 380 MG SUSR Take 2.5 mg by mouth daily.     Semaglutide,0.25 or 0.5MG /DOS, 2 MG/3ML SOPN Inject 1 mg into the skin once a week.     zolpidem  (AMBIEN ) 5 MG tablet Take 1 tablet (5 mg total) by mouth at bedtime as needed for sleep. Psych 30 tablet 5   No current facility-administered medications for this visit.     Musculoskeletal: Strength & Muscle Tone: within normal limits Gait & Station: normal Patient leans: N/A  Psychiatric Specialty Exam: Review of Systems  Psychiatric/Behavioral:  Positive for sleep disturbance. The patient is nervous/anxious.     Blood pressure 129/82, pulse 71, temperature (!) 97.5 F (36.4 C), temperature source Temporal, height 5' 3.5 (1.613 m), weight 134 lb 6.4 oz (61 kg).Body mass index is 23.43 kg/m.  General Appearance: Casual  Eye Contact:  Fair  Speech:  Clear and Coherent   Volume:  Normal  Mood:  Anxious  Affect:  Appropriate  Thought Process:  Goal Directed and Descriptions of Associations: Intact  Orientation:  Full (Time, Place, and Person)  Thought Content: Logical   Suicidal Thoughts:  No  Homicidal Thoughts:  No  Memory:  Immediate;   Fair Recent;   Fair Remote;   Fair  Judgement:  Fair  Insight:  Fair  Psychomotor Activity:  Normal  Concentration:  Concentration: Fair and Attention Span: Fair  Recall:  [employer] of Knowledge: Fair  Language: Fair  Akathisia:  No  Handed:  Right  AIMS (if indicated): not done  Assets:  Communication Skills Desire for Improvement Housing Social Support Transportation  ADL's:  Intact  Cognition: WNL  Sleep:  Improving   Screenings: GAD-7    Flowsheet Row Office Visit from 04/27/2024 in [facility] Office Visit from 12/14/2023 in [facility] Office Visit from 12/03/2022 in [facility] Office Visit from 08/06/2022 in [facility] Medicine at [facility] Office Visit from 07/02/2022 in [facility] at [facility]  Total GAD-7 Score 14 15 0 0 0   PHQ2-9    Flowsheet Row Office Visit from 04/27/2024 in [facility] Office Visit from 12/14/2023 in [facility] Office Visit from 11/30/2023 in [facility] Procedure visit from 11/10/2023 in [facility] Visit from 10/14/2023 in [facility] Total Score 1 2 0 1 0  PHQ-9 Total Score -- 9 -- -- --   Flowsheet Row Office Visit from 04/27/2024 in [facility] Office Visit  from 12/14/2023 in [facility] Admission (Discharged) from 01/14/2023 in [facility] SURGE PERIOPERATIVE AREA  C-SSRS RISK CATEGORY No Risk No Risk No Risk     Assessment and Plan: Sharon Kaufman is a 73 year old Caucasian female, presented for a follow-up appointment discussed assessment and plan as noted below.  1. GAD (generalized anxiety disorder)-improving Continues to have ongoing day-to-day anxiety and it varies with situational stressors.  She declines initiation of a scheduled medication like SSRI or SNRI.  She also declines referral for psychotherapy. Continue Lorazepam  0.5 mg, takes it 1-2 times a week only for severe anxiety attacks. Provided education about long-term risk of benzodiazepine therapy and advised to limit use.   2. Primary insomnia-improving Currently reports Ambien  does help with sleep. Continue sleep hygiene techniques Continue Ambien  5 mg at bedtime as needed Reviewed [place] PMP AWARxE    Follow-up Follow-up in clinic in 3 months or sooner if needed Collaboration of Care: Collaboration of Care: Patient refused AEB patient declines referral for therapy.  Patient/Guardian was advised Release of Information must be obtained prior to any record release in order to collaborate their care with an outside provider. Patient/Guardian was advised if they have not already done so to contact the registration department to sign all necessary forms in order for us  to release information regarding their care.   Consent: Patient/Guardian gives verbal consent for treatment and assignment of benefits for services provided during this visit. Patient/Guardian expressed understanding and agreed to proceed.   This note was generated in part or whole with voice recognition software. Voice recognition is usually quite accurate but there are transcription errors that can  and very often do occur. I apologize for any typographical errors that were not  detected and corrected.    Malakye Nolden, MD 04/30/2024, 8:32 AM

## 2024-04-27 NOTE — Patient Instructions (Signed)
 Lorazepam  Tablets What is this medication? LORAZEPAM  (lor A ze pam) treats anxiety. It works by education administrator system calm down. It belongs to a group of medications called benzodiazepines. This medicine may be used for other purposes; ask your health care provider or pharmacist if you have questions. COMMON BRAND NAME(S): Ativan  What should I tell my care team before I take this medication? They need to know if you have any of these conditions: Glaucoma Have or have had seizures Kidney disease Liver disease Lung or breathing disease, such as asthma or COPD Mental health conditions Sleep apnea Substance use disorder Suicidal thoughts, plans, or attempt by you or a family member An unusual or allergic reaction to lorazepam , other medications, foods, dyes, or preservatives Pregnant or trying to get pregnant Breastfeeding How should I use this medication? Take this medication by mouth with water. Take it as directed on the prescription label at the same time every day. Keep taking it unless your care team tells you to stop. A special MedGuide will be given to you by the pharmacist with each prescription and refill. Be sure to read this information carefully each time. Talk to your care team about the use of this medication in children. While it may be prescribed for children as young as 12 years for selected conditions, precautions do apply. People 65 years and older may have a stronger reaction and need a smaller dose. Overdosage: If you think you have taken too much of this medicine contact a poison control center or emergency room at once. NOTE: This medicine is only for you. Do not share this medicine with others. What if I miss a dose? If you miss a dose, take it as soon as you can. If it is almost time for your next dose, take only that dose. Do not take double or extra doses. What may interact with this medication? Do not take this medication with any of the following: Sodium  oxybate This medication may also interact with the following: Alcohol  Medications that cause drowsiness before a procedure, such as propofol  Medications that help you fall asleep Medications that relax muscles Opioids for pain or cough Other benzodiazepines Phenothiazines, such as chlorpromazine, prochlorperazine, thioridazine Probenecid Some antihistamines Some medications for depression, such as amitriptyline  or trazodone Some medications for seizures, such as phenobarbital, primidone, valproic acid Supplements, such as green tea, melatonin, valerian Other medications may affect the way this medication works. Talk with your care team about all the medications you take. They may suggest changes to your treatment plan to lower the risk of side effects and to make sure your medications work as intended. This list may not describe all possible interactions. Give your health care provider a list of all the medicines, herbs, non-prescription drugs, or dietary supplements you use. Also tell them if you smoke, drink alcohol , or use illegal drugs. Some items may interact with your medicine. What should I watch for while using this medication? Visit your care team for regular checks on your progress. Tell your care team if your symptoms do not start to get better or if they get worse. There is a risk of abuse, misuse, and addiction with this medication. It is important to take this medication as directed by your care team. This medication may affect your coordination, reaction time, or judgment. Do not drive or operate machinery until you know how this medication affects you. Sit up or stand slowly to reduce the risk of dizzy or fainting spells. Drinking alcohol   with this medication can increase the risk of these side effects. This medication is a CNS depressant. This is a type of medication or substance that slows down your brain and nervous system. Taking it with other CNS depressants can make you too  sleepy. This can make it hard to breathe and stay awake. In some cases, it can cause coma and death. CNS depressants include opioids, benzodiazepines, muscle relaxants, medications for sleep, alcohol , and street drugs. Talk to your care team about all the medications, vitamins, and supplements you take. They can tell you what is safe to take together. Call emergency services right away if you have slow or shallow breathing, feel dizzy or confused, or have trouble staying awake. Do not stop taking this medication or reduce your dose without first talking to your care team. If you have taken this medication for a long time or take a high dose, your body may rely on it. Stopping it suddenly may cause a severe reaction. Talk to your care team about how long you need to take this medication. When it is time to stop, the dose will be slowly lowered over time to reduce the risk of side effects. This medication may worsen depression and cause thoughts of suicide. This can happen at any time but is more common after first starting treatment and after a change in dose. Talk to your care team right away if you have changes in mood and behavior or thoughts of self-harm or suicide. They can help you. Talk to your care team if you may be pregnant. Prolonged use of this medication during pregnancy can cause temporary withdrawal in a newborn. Talk to your care team before breastfeeding. Changes to your treatment plan may be needed. If you breastfeed while taking this medication, seek medical care right away if you notice the child has slow or noisy breathing, is unusually sleepy or not able to wake up, or is limp. What side effects may I notice from receiving this medication? Side effects that you should report to your care team as soon as possible: Allergic reactions--skin rash, itching, hives, swelling of the face, lips, tongue, or throat CNS depression--slow or shallow breathing, shortness of breath, feeling faint,  dizziness, confusion, trouble staying awake Thoughts of suicide or self-harm, worsening mood, feelings of depression Side effects that usually do not require medical attention (report these to your care team if they continue or are bothersome): Change in sex drive or performance Dizziness Drowsiness Loss of balance or coordination This list may not describe all possible side effects. Call your doctor for medical advice about side effects. You may report side effects to FDA at 1-800-FDA-1088. Where should I keep my medication? Keep out of the reach of children and pets. Store it out of sight in a safe place. Do not share it with others. Misuse of this medication is dangerous and against the law. Store at room temperature between 20 and 25 degrees C (68 and 77 degrees F). Keep container tightly closed. Get rid of any unused medication after the expiration date. This medication may cause harm and death if it is taken by other adults, children, or pets. It is important to get rid of the medication as soon as you no longer need it or it is expired. To get rid of this medication: Take the medication to a take-back program. Check with your pharmacy or law enforcement to find a location. Follow the steps given to you by your pharmacy. You may be given  a pre-paid mail-back envelope or disposal product to safely get rid of your medication. If other options are not available, check the package insert or medication guide to see if it should be flushed down the toilet or put in your trash at home. If you are not sure, ask your care team. If it is safe to put it in your trash, empty the medication out of the container. Mix it with cat litter, dirt, used coffee grounds, or another unwanted substance. Seal the mixture in a container, such as a plastic bag. Put it in the trash. NOTE: This sheet is a summary. It may not cover all possible information. If you have questions about this medicine, talk to your doctor,  pharmacist, or health care provider.  2025 Elsevier/Gold Standard (2023-08-23 00:00:00)Managing Anxiety, Adult After being diagnosed with anxiety, you may be relieved to know why you have felt or behaved a certain way. You may also feel overwhelmed about the treatment ahead and what it will mean for your life. With care and support, you can manage your anxiety. How to manage lifestyle changes Understanding the difference between stress and anxiety Although stress can play a role in anxiety, it is not the same as anxiety. Stress is your body's reaction to life changes and events, both good and bad. Stress is often caused by something external, such as a deadline, test, or competition. It normally goes away after the event has ended and will last just a few hours. But, stress can be ongoing and can lead to more than just stress. Anxiety is caused by something internal, such as imagining a terrible outcome or worrying that something will go wrong that will greatly upset you. Anxiety often does not go away even after the event is over, and it can become a long-term (chronic) worry. Lowering stress and anxiety Talk with your health care provider or a counselor to learn more about lowering anxiety and stress. They may suggest tension-reduction techniques, such as: Music. Spend time creating or listening to music that you enjoy and that inspires you. Mindfulness-based meditation. Practice being aware of your normal breaths while not trying to control your breathing. It can be done while sitting or walking. Centering prayer. Focus on a word, phrase, or sacred image that means something to you and brings you peace. Deep breathing. Expand your stomach and inhale slowly through your nose. Hold your breath for 3-5 seconds. Then breathe out slowly, letting your stomach muscles relax. Self-talk. Learn to notice and spot thought patterns that lead to anxiety reactions. Change those patterns to thoughts that feel  peaceful. Muscle relaxation. Take time to tense muscles and then relax them. Choose a tension-reduction technique that fits your lifestyle and personality. These techniques take time and practice. Set aside 5-15 minutes a day to do them. Specialized therapists can offer counseling and training in these techniques. The training to help with anxiety may be covered by some insurance plans. Other things you can do to manage stress and anxiety include: Keeping a stress diary. This can help you learn what triggers your reaction and then learn ways to manage your response. Thinking about how you react to certain situations. You may not be able to control everything, but you can control your response. Making time for activities that help you relax and not feeling guilty about spending your time in this way. Doing visual imagery. This involves imagining or creating mental pictures to help you relax. Practicing yoga. Through yoga poses, you can lower tension  and relax.  Medicines Medicines for anxiety include: Antidepressant medicines. These are usually prescribed for long-term daily control. Anti-anxiety medicines. These may be added in severe cases, especially when panic attacks occur. When used together, medicines, psychotherapy, and tension-reduction techniques may be the most effective treatment. Relationships Relationships can play a big part in helping you recover. Spend more time connecting with trusted friends and family members. Think about going to couples counseling if you have a partner, taking family education classes, or going to family therapy. Therapy can help you and others better understand your anxiety. How to recognize changes in your anxiety Everyone responds differently to treatment for anxiety. Recovery from anxiety happens when symptoms lessen and stop interfering with your daily life at home or work. This may mean that you will start to: Have better concentration and focus. Worry  will interfere less in your daily thinking. Sleep better. Be less irritable. Have more energy. Have improved memory. Try to recognize when your condition is getting worse. Contact your provider if your symptoms interfere with home or work and you feel like your condition is not improving. Follow these instructions at home: Activity Exercise. Adults should: Exercise for at least 150 minutes each week. The exercise should increase your heart rate and make you sweat (moderate-intensity exercise). Do strengthening exercises at least twice a week. Get the right amount and quality of sleep. Most adults need 7-9 hours of sleep each night. Lifestyle  Eat a healthy diet that includes plenty of vegetables, fruits, whole grains, low-fat dairy products, and lean protein. Do not eat a lot of foods that are high in fats, added sugars, or salt (sodium). Make choices that simplify your life. Do not use any products that contain nicotine or tobacco. These products include cigarettes, chewing tobacco, and vaping devices, such as e-cigarettes. If you need help quitting, ask your provider. Avoid caffeine, alcohol , and certain over-the-counter cold medicines. These may make you feel worse. Ask your pharmacist which medicines to avoid. General instructions Take over-the-counter and prescription medicines only as told by your provider. Keep all follow-up visits. This is to make sure you are managing your anxiety well or if you need more support. Where to find support You can get help and support from: Self-help groups. Online and [employer]. A trusted spiritual leader. Couples counseling. Family education classes. Family therapy. Where to find more information You may find that joining a support group helps you deal with your anxiety. The following sources can help you find counselors or support groups near you: Mental Health [place]: mentalhealthamerica.net Anxiety and Depression Association  of [place] (ADAA): adaa.org [employer] on Mental Illness (NAMI): nami.org Contact a health care provider if: You have a hard time staying focused or finishing tasks. You spend many hours a day feeling worried about everyday life. You are very tired because you cannot stop worrying. You start to have headaches or often feel tense. You have chronic nausea or diarrhea. Get help right away if: Your heart feels like it is racing. You have shortness of breath. You have thoughts of hurting yourself or others. Get help right away if you feel like you may hurt yourself or others, or have thoughts about taking your own life. Go to your nearest emergency room or: Call 911. Call the National Suicide Prevention Lifeline at 804-742-0901 or 988. This is open 24 hours a day. Text the Crisis Text Line at 316-520-7455. This information is not intended to replace advice given to you by your health care  provider. Make sure you discuss any questions you have with your health care provider. Document Revised: 02/17/2022 Document Reviewed: 09/01/2020 Elsevier Patient Education  2024 [employer].

## 2024-05-12 ENCOUNTER — Ambulatory Visit
Admission: RE | Admit: 2024-05-12 | Discharge: 2024-05-12 | Disposition: A | Discharge status: Home / Self Care | Source: Ambulatory Visit | Attending: Internal Medicine | Admitting: Internal Medicine

## 2024-05-12 DIAGNOSIS — Z1231 Encounter for screening mammogram for malignant neoplasm of breast: Secondary | ICD-10-CM | POA: Diagnosis present

## 2024-07-26 ENCOUNTER — Telehealth: Admitting: Psychiatry
